# Patient Record
Sex: Female | Born: 1935
Health system: Southern US, Community
[De-identification: ages and names within clinical notes are randomized; demographics above are authoritative.]

## PROBLEM LIST (undated history)

## (undated) DIAGNOSIS — K219 Gastro-esophageal reflux disease without esophagitis: Secondary | ICD-10-CM

## (undated) DIAGNOSIS — E785 Hyperlipidemia, unspecified: Secondary | ICD-10-CM

## (undated) DIAGNOSIS — Z8619 Personal history of other infectious and parasitic diseases: Secondary | ICD-10-CM

## (undated) DIAGNOSIS — E119 Type 2 diabetes mellitus without complications: Secondary | ICD-10-CM

## (undated) DIAGNOSIS — G4730 Sleep apnea, unspecified: Secondary | ICD-10-CM

## (undated) DIAGNOSIS — T7840XA Allergy, unspecified, initial encounter: Secondary | ICD-10-CM

## (undated) DIAGNOSIS — H269 Unspecified cataract: Secondary | ICD-10-CM

## (undated) DIAGNOSIS — I10 Essential (primary) hypertension: Secondary | ICD-10-CM

## (undated) HISTORY — DX: Personal history of other infectious and parasitic diseases: Z86.19

## (undated) HISTORY — DX: Unspecified cataract: H26.9

## (undated) HISTORY — DX: Hyperlipidemia, unspecified: E78.5

## (undated) HISTORY — DX: Allergy, unspecified, initial encounter: T78.40XA

## (undated) HISTORY — PX: BUNIONECTOMY: SHX129

## (undated) HISTORY — DX: Gastro-esophageal reflux disease without esophagitis: K21.9

## (undated) HISTORY — PX: APPENDECTOMY: SHX54

## (undated) HISTORY — DX: Type 2 diabetes mellitus without complications: E11.9

## (undated) HISTORY — PX: BREAST EXCISIONAL BIOPSY: SUR124

## (undated) HISTORY — DX: Essential (primary) hypertension: I10

## (undated) HISTORY — DX: Sleep apnea, unspecified: G47.30

## (undated) HISTORY — PX: BREAST SURGERY: SHX581

## (undated) HISTORY — PX: BREAST BIOPSY: SHX20

---

## 2016-03-18 DIAGNOSIS — I10 Essential (primary) hypertension: Secondary | ICD-10-CM | POA: Diagnosis not present

## 2016-03-18 DIAGNOSIS — I340 Nonrheumatic mitral (valve) insufficiency: Secondary | ICD-10-CM | POA: Diagnosis not present

## 2016-04-04 DIAGNOSIS — I340 Nonrheumatic mitral (valve) insufficiency: Secondary | ICD-10-CM | POA: Diagnosis not present

## 2016-04-04 DIAGNOSIS — J039 Acute tonsillitis, unspecified: Secondary | ICD-10-CM | POA: Diagnosis not present

## 2016-04-04 DIAGNOSIS — J019 Acute sinusitis, unspecified: Secondary | ICD-10-CM | POA: Diagnosis not present

## 2016-04-04 DIAGNOSIS — E119 Type 2 diabetes mellitus without complications: Secondary | ICD-10-CM | POA: Diagnosis not present

## 2016-05-13 DIAGNOSIS — L603 Nail dystrophy: Secondary | ICD-10-CM | POA: Diagnosis not present

## 2016-05-13 DIAGNOSIS — E119 Type 2 diabetes mellitus without complications: Secondary | ICD-10-CM | POA: Diagnosis not present

## 2016-05-13 DIAGNOSIS — L84 Corns and callosities: Secondary | ICD-10-CM | POA: Diagnosis not present

## 2016-05-13 DIAGNOSIS — B351 Tinea unguium: Secondary | ICD-10-CM | POA: Diagnosis not present

## 2016-05-16 DIAGNOSIS — M25374 Other instability, right foot: Secondary | ICD-10-CM | POA: Diagnosis not present

## 2016-05-16 DIAGNOSIS — M7752 Other enthesopathy of left foot: Secondary | ICD-10-CM | POA: Diagnosis not present

## 2016-05-16 DIAGNOSIS — M25375 Other instability, left foot: Secondary | ICD-10-CM | POA: Diagnosis not present

## 2016-05-16 DIAGNOSIS — M7751 Other enthesopathy of right foot: Secondary | ICD-10-CM | POA: Diagnosis not present

## 2016-05-20 DIAGNOSIS — M109 Gout, unspecified: Secondary | ICD-10-CM | POA: Diagnosis not present

## 2016-05-20 DIAGNOSIS — E559 Vitamin D deficiency, unspecified: Secondary | ICD-10-CM | POA: Diagnosis not present

## 2016-05-20 DIAGNOSIS — E782 Mixed hyperlipidemia: Secondary | ICD-10-CM | POA: Diagnosis not present

## 2016-05-20 DIAGNOSIS — E119 Type 2 diabetes mellitus without complications: Secondary | ICD-10-CM | POA: Diagnosis not present

## 2016-05-20 DIAGNOSIS — Z0000 Encounter for general adult medical examination without abnormal findings: Secondary | ICD-10-CM | POA: Diagnosis not present

## 2016-05-20 DIAGNOSIS — I10 Essential (primary) hypertension: Secondary | ICD-10-CM | POA: Diagnosis not present

## 2016-06-03 DIAGNOSIS — M216X1 Other acquired deformities of right foot: Secondary | ICD-10-CM | POA: Diagnosis not present

## 2016-06-03 DIAGNOSIS — M216X2 Other acquired deformities of left foot: Secondary | ICD-10-CM | POA: Diagnosis not present

## 2016-06-03 DIAGNOSIS — M2042 Other hammer toe(s) (acquired), left foot: Secondary | ICD-10-CM | POA: Diagnosis not present

## 2016-06-03 DIAGNOSIS — M2041 Other hammer toe(s) (acquired), right foot: Secondary | ICD-10-CM | POA: Diagnosis not present

## 2016-06-03 DIAGNOSIS — M2022 Hallux rigidus, left foot: Secondary | ICD-10-CM | POA: Diagnosis not present

## 2016-06-06 DIAGNOSIS — E1165 Type 2 diabetes mellitus with hyperglycemia: Secondary | ICD-10-CM | POA: Diagnosis not present

## 2016-07-15 DIAGNOSIS — L84 Corns and callosities: Secondary | ICD-10-CM | POA: Diagnosis not present

## 2016-07-15 DIAGNOSIS — B351 Tinea unguium: Secondary | ICD-10-CM | POA: Diagnosis not present

## 2016-07-15 DIAGNOSIS — L603 Nail dystrophy: Secondary | ICD-10-CM | POA: Diagnosis not present

## 2016-07-15 DIAGNOSIS — E119 Type 2 diabetes mellitus without complications: Secondary | ICD-10-CM | POA: Diagnosis not present

## 2016-08-12 DIAGNOSIS — I10 Essential (primary) hypertension: Secondary | ICD-10-CM | POA: Diagnosis not present

## 2016-08-12 DIAGNOSIS — J029 Acute pharyngitis, unspecified: Secondary | ICD-10-CM | POA: Diagnosis not present

## 2016-08-12 DIAGNOSIS — E782 Mixed hyperlipidemia: Secondary | ICD-10-CM | POA: Diagnosis not present

## 2016-08-12 DIAGNOSIS — I739 Peripheral vascular disease, unspecified: Secondary | ICD-10-CM | POA: Diagnosis not present

## 2016-08-12 DIAGNOSIS — E119 Type 2 diabetes mellitus without complications: Secondary | ICD-10-CM | POA: Diagnosis not present

## 2016-08-12 DIAGNOSIS — M109 Gout, unspecified: Secondary | ICD-10-CM | POA: Diagnosis not present

## 2016-08-15 DIAGNOSIS — E119 Type 2 diabetes mellitus without complications: Secondary | ICD-10-CM | POA: Diagnosis not present

## 2016-08-15 DIAGNOSIS — M79672 Pain in left foot: Secondary | ICD-10-CM | POA: Diagnosis not present

## 2016-08-24 DIAGNOSIS — S90425A Blister (nonthermal), left lesser toe(s), initial encounter: Secondary | ICD-10-CM | POA: Diagnosis not present

## 2016-08-24 DIAGNOSIS — L089 Local infection of the skin and subcutaneous tissue, unspecified: Secondary | ICD-10-CM | POA: Diagnosis not present

## 2016-08-24 DIAGNOSIS — S90422A Blister (nonthermal), left great toe, initial encounter: Secondary | ICD-10-CM | POA: Diagnosis not present

## 2016-08-24 DIAGNOSIS — M79672 Pain in left foot: Secondary | ICD-10-CM | POA: Diagnosis not present

## 2016-08-30 DIAGNOSIS — S90422D Blister (nonthermal), left great toe, subsequent encounter: Secondary | ICD-10-CM | POA: Diagnosis not present

## 2016-08-30 DIAGNOSIS — E1151 Type 2 diabetes mellitus with diabetic peripheral angiopathy without gangrene: Secondary | ICD-10-CM | POA: Diagnosis not present

## 2016-09-01 DIAGNOSIS — E1165 Type 2 diabetes mellitus with hyperglycemia: Secondary | ICD-10-CM | POA: Diagnosis not present

## 2016-09-05 DIAGNOSIS — E1165 Type 2 diabetes mellitus with hyperglycemia: Secondary | ICD-10-CM | POA: Diagnosis not present

## 2016-09-06 DIAGNOSIS — E1151 Type 2 diabetes mellitus with diabetic peripheral angiopathy without gangrene: Secondary | ICD-10-CM | POA: Diagnosis not present

## 2016-09-06 DIAGNOSIS — G609 Hereditary and idiopathic neuropathy, unspecified: Secondary | ICD-10-CM | POA: Diagnosis not present

## 2016-09-16 DIAGNOSIS — L84 Corns and callosities: Secondary | ICD-10-CM | POA: Diagnosis not present

## 2016-09-16 DIAGNOSIS — E119 Type 2 diabetes mellitus without complications: Secondary | ICD-10-CM | POA: Diagnosis not present

## 2016-09-16 DIAGNOSIS — B351 Tinea unguium: Secondary | ICD-10-CM | POA: Diagnosis not present

## 2016-09-16 DIAGNOSIS — L603 Nail dystrophy: Secondary | ICD-10-CM | POA: Diagnosis not present

## 2016-09-30 DIAGNOSIS — E1151 Type 2 diabetes mellitus with diabetic peripheral angiopathy without gangrene: Secondary | ICD-10-CM | POA: Diagnosis not present

## 2016-09-30 DIAGNOSIS — B353 Tinea pedis: Secondary | ICD-10-CM | POA: Diagnosis not present

## 2016-10-28 DIAGNOSIS — I10 Essential (primary) hypertension: Secondary | ICD-10-CM | POA: Diagnosis not present

## 2016-10-28 DIAGNOSIS — E119 Type 2 diabetes mellitus without complications: Secondary | ICD-10-CM | POA: Diagnosis not present

## 2016-10-28 DIAGNOSIS — E782 Mixed hyperlipidemia: Secondary | ICD-10-CM | POA: Diagnosis not present

## 2016-10-28 DIAGNOSIS — R05 Cough: Secondary | ICD-10-CM | POA: Diagnosis not present

## 2016-10-28 DIAGNOSIS — J029 Acute pharyngitis, unspecified: Secondary | ICD-10-CM | POA: Diagnosis not present

## 2016-10-28 DIAGNOSIS — M109 Gout, unspecified: Secondary | ICD-10-CM | POA: Diagnosis not present

## 2016-11-21 DIAGNOSIS — L603 Nail dystrophy: Secondary | ICD-10-CM | POA: Diagnosis not present

## 2016-11-21 DIAGNOSIS — L84 Corns and callosities: Secondary | ICD-10-CM | POA: Diagnosis not present

## 2016-11-21 DIAGNOSIS — E119 Type 2 diabetes mellitus without complications: Secondary | ICD-10-CM | POA: Diagnosis not present

## 2016-11-21 DIAGNOSIS — B351 Tinea unguium: Secondary | ICD-10-CM | POA: Diagnosis not present

## 2016-12-02 DIAGNOSIS — E1165 Type 2 diabetes mellitus with hyperglycemia: Secondary | ICD-10-CM | POA: Diagnosis not present

## 2016-12-22 DIAGNOSIS — H04553 Acquired stenosis of bilateral nasolacrimal duct: Secondary | ICD-10-CM | POA: Diagnosis not present

## 2016-12-22 DIAGNOSIS — H1013 Acute atopic conjunctivitis, bilateral: Secondary | ICD-10-CM | POA: Diagnosis not present

## 2016-12-22 DIAGNOSIS — H2513 Age-related nuclear cataract, bilateral: Secondary | ICD-10-CM | POA: Diagnosis not present

## 2016-12-22 DIAGNOSIS — E119 Type 2 diabetes mellitus without complications: Secondary | ICD-10-CM | POA: Diagnosis not present

## 2017-01-03 DIAGNOSIS — E782 Mixed hyperlipidemia: Secondary | ICD-10-CM | POA: Diagnosis not present

## 2017-01-03 DIAGNOSIS — E559 Vitamin D deficiency, unspecified: Secondary | ICD-10-CM | POA: Diagnosis not present

## 2017-01-03 DIAGNOSIS — E119 Type 2 diabetes mellitus without complications: Secondary | ICD-10-CM | POA: Diagnosis not present

## 2017-01-03 DIAGNOSIS — I10 Essential (primary) hypertension: Secondary | ICD-10-CM | POA: Diagnosis not present

## 2017-01-03 DIAGNOSIS — E038 Other specified hypothyroidism: Secondary | ICD-10-CM | POA: Diagnosis not present

## 2017-01-13 DIAGNOSIS — M109 Gout, unspecified: Secondary | ICD-10-CM | POA: Diagnosis not present

## 2017-01-13 DIAGNOSIS — E782 Mixed hyperlipidemia: Secondary | ICD-10-CM | POA: Diagnosis not present

## 2017-01-13 DIAGNOSIS — J45909 Unspecified asthma, uncomplicated: Secondary | ICD-10-CM | POA: Diagnosis not present

## 2017-01-13 DIAGNOSIS — I10 Essential (primary) hypertension: Secondary | ICD-10-CM | POA: Diagnosis not present

## 2017-01-13 DIAGNOSIS — E119 Type 2 diabetes mellitus without complications: Secondary | ICD-10-CM | POA: Diagnosis not present

## 2017-01-13 DIAGNOSIS — R05 Cough: Secondary | ICD-10-CM | POA: Diagnosis not present

## 2017-01-27 DIAGNOSIS — L603 Nail dystrophy: Secondary | ICD-10-CM | POA: Diagnosis not present

## 2017-01-27 DIAGNOSIS — B351 Tinea unguium: Secondary | ICD-10-CM | POA: Diagnosis not present

## 2017-01-27 DIAGNOSIS — E119 Type 2 diabetes mellitus without complications: Secondary | ICD-10-CM | POA: Diagnosis not present

## 2017-01-27 DIAGNOSIS — L84 Corns and callosities: Secondary | ICD-10-CM | POA: Diagnosis not present

## 2017-02-06 DIAGNOSIS — M170 Bilateral primary osteoarthritis of knee: Secondary | ICD-10-CM | POA: Diagnosis not present

## 2017-02-06 DIAGNOSIS — Z808 Family history of malignant neoplasm of other organs or systems: Secondary | ICD-10-CM | POA: Diagnosis not present

## 2017-02-06 DIAGNOSIS — E785 Hyperlipidemia, unspecified: Secondary | ICD-10-CM | POA: Diagnosis not present

## 2017-02-06 DIAGNOSIS — Z7984 Long term (current) use of oral hypoglycemic drugs: Secondary | ICD-10-CM | POA: Diagnosis not present

## 2017-02-06 DIAGNOSIS — Z8041 Family history of malignant neoplasm of ovary: Secondary | ICD-10-CM | POA: Diagnosis not present

## 2017-02-06 DIAGNOSIS — I10 Essential (primary) hypertension: Secondary | ICD-10-CM | POA: Diagnosis not present

## 2017-02-06 DIAGNOSIS — Z6830 Body mass index (BMI) 30.0-30.9, adult: Secondary | ICD-10-CM | POA: Diagnosis not present

## 2017-02-06 DIAGNOSIS — E119 Type 2 diabetes mellitus without complications: Secondary | ICD-10-CM | POA: Diagnosis not present

## 2017-02-06 DIAGNOSIS — J302 Other seasonal allergic rhinitis: Secondary | ICD-10-CM | POA: Diagnosis not present

## 2017-02-06 DIAGNOSIS — E6609 Other obesity due to excess calories: Secondary | ICD-10-CM | POA: Diagnosis not present

## 2017-02-17 DIAGNOSIS — E1151 Type 2 diabetes mellitus with diabetic peripheral angiopathy without gangrene: Secondary | ICD-10-CM | POA: Diagnosis not present

## 2017-02-17 DIAGNOSIS — M2022 Hallux rigidus, left foot: Secondary | ICD-10-CM | POA: Diagnosis not present

## 2017-02-17 DIAGNOSIS — M2021 Hallux rigidus, right foot: Secondary | ICD-10-CM | POA: Diagnosis not present

## 2017-03-03 DIAGNOSIS — E1165 Type 2 diabetes mellitus with hyperglycemia: Secondary | ICD-10-CM | POA: Diagnosis not present

## 2017-03-21 DIAGNOSIS — I10 Essential (primary) hypertension: Secondary | ICD-10-CM | POA: Diagnosis not present

## 2017-03-21 DIAGNOSIS — M609 Myositis, unspecified: Secondary | ICD-10-CM | POA: Diagnosis not present

## 2017-03-21 DIAGNOSIS — I340 Nonrheumatic mitral (valve) insufficiency: Secondary | ICD-10-CM | POA: Diagnosis not present

## 2017-03-21 DIAGNOSIS — E782 Mixed hyperlipidemia: Secondary | ICD-10-CM | POA: Diagnosis not present

## 2017-03-21 DIAGNOSIS — I6523 Occlusion and stenosis of bilateral carotid arteries: Secondary | ICD-10-CM | POA: Diagnosis not present

## 2017-03-24 DIAGNOSIS — E119 Type 2 diabetes mellitus without complications: Secondary | ICD-10-CM | POA: Diagnosis not present

## 2017-03-24 DIAGNOSIS — Z124 Encounter for screening for malignant neoplasm of cervix: Secondary | ICD-10-CM | POA: Diagnosis not present

## 2017-03-24 DIAGNOSIS — Z01419 Encounter for gynecological examination (general) (routine) without abnormal findings: Secondary | ICD-10-CM | POA: Diagnosis not present

## 2017-03-24 DIAGNOSIS — I10 Essential (primary) hypertension: Secondary | ICD-10-CM | POA: Diagnosis not present

## 2017-03-24 DIAGNOSIS — Z13820 Encounter for screening for osteoporosis: Secondary | ICD-10-CM | POA: Diagnosis not present

## 2017-03-24 DIAGNOSIS — Z1231 Encounter for screening mammogram for malignant neoplasm of breast: Secondary | ICD-10-CM | POA: Diagnosis not present

## 2017-03-31 DIAGNOSIS — E119 Type 2 diabetes mellitus without complications: Secondary | ICD-10-CM | POA: Diagnosis not present

## 2017-03-31 DIAGNOSIS — L84 Corns and callosities: Secondary | ICD-10-CM | POA: Diagnosis not present

## 2017-03-31 DIAGNOSIS — L603 Nail dystrophy: Secondary | ICD-10-CM | POA: Diagnosis not present

## 2017-03-31 DIAGNOSIS — B351 Tinea unguium: Secondary | ICD-10-CM | POA: Diagnosis not present

## 2017-04-05 DIAGNOSIS — M5412 Radiculopathy, cervical region: Secondary | ICD-10-CM | POA: Diagnosis not present

## 2017-04-05 DIAGNOSIS — M62411 Contracture of muscle, right shoulder: Secondary | ICD-10-CM | POA: Diagnosis not present

## 2017-04-05 DIAGNOSIS — M62412 Contracture of muscle, left shoulder: Secondary | ICD-10-CM | POA: Diagnosis not present

## 2017-04-05 DIAGNOSIS — M6281 Muscle weakness (generalized): Secondary | ICD-10-CM | POA: Diagnosis not present

## 2017-04-06 DIAGNOSIS — R29890 Loss of height: Secondary | ICD-10-CM | POA: Diagnosis not present

## 2017-04-06 DIAGNOSIS — E119 Type 2 diabetes mellitus without complications: Secondary | ICD-10-CM | POA: Diagnosis not present

## 2017-04-06 DIAGNOSIS — Z13820 Encounter for screening for osteoporosis: Secondary | ICD-10-CM | POA: Diagnosis not present

## 2017-04-06 DIAGNOSIS — Z1231 Encounter for screening mammogram for malignant neoplasm of breast: Secondary | ICD-10-CM | POA: Diagnosis not present

## 2017-04-06 DIAGNOSIS — Z780 Asymptomatic menopausal state: Secondary | ICD-10-CM | POA: Diagnosis not present

## 2017-04-14 DIAGNOSIS — M6281 Muscle weakness (generalized): Secondary | ICD-10-CM | POA: Diagnosis not present

## 2017-04-14 DIAGNOSIS — M5412 Radiculopathy, cervical region: Secondary | ICD-10-CM | POA: Diagnosis not present

## 2017-04-14 DIAGNOSIS — M62412 Contracture of muscle, left shoulder: Secondary | ICD-10-CM | POA: Diagnosis not present

## 2017-04-14 DIAGNOSIS — M62411 Contracture of muscle, right shoulder: Secondary | ICD-10-CM | POA: Diagnosis not present

## 2017-04-19 DIAGNOSIS — M62412 Contracture of muscle, left shoulder: Secondary | ICD-10-CM | POA: Diagnosis not present

## 2017-04-19 DIAGNOSIS — M62411 Contracture of muscle, right shoulder: Secondary | ICD-10-CM | POA: Diagnosis not present

## 2017-04-19 DIAGNOSIS — M5412 Radiculopathy, cervical region: Secondary | ICD-10-CM | POA: Diagnosis not present

## 2017-04-19 DIAGNOSIS — M6281 Muscle weakness (generalized): Secondary | ICD-10-CM | POA: Diagnosis not present

## 2017-04-26 DIAGNOSIS — M5412 Radiculopathy, cervical region: Secondary | ICD-10-CM | POA: Diagnosis not present

## 2017-04-26 DIAGNOSIS — M62412 Contracture of muscle, left shoulder: Secondary | ICD-10-CM | POA: Diagnosis not present

## 2017-04-26 DIAGNOSIS — M62411 Contracture of muscle, right shoulder: Secondary | ICD-10-CM | POA: Diagnosis not present

## 2017-04-26 DIAGNOSIS — M6281 Muscle weakness (generalized): Secondary | ICD-10-CM | POA: Diagnosis not present

## 2017-04-28 DIAGNOSIS — M5412 Radiculopathy, cervical region: Secondary | ICD-10-CM | POA: Diagnosis not present

## 2017-04-28 DIAGNOSIS — M6281 Muscle weakness (generalized): Secondary | ICD-10-CM | POA: Diagnosis not present

## 2017-04-28 DIAGNOSIS — M62411 Contracture of muscle, right shoulder: Secondary | ICD-10-CM | POA: Diagnosis not present

## 2017-04-28 DIAGNOSIS — M62412 Contracture of muscle, left shoulder: Secondary | ICD-10-CM | POA: Diagnosis not present

## 2017-05-03 DIAGNOSIS — M6281 Muscle weakness (generalized): Secondary | ICD-10-CM | POA: Diagnosis not present

## 2017-05-03 DIAGNOSIS — M5412 Radiculopathy, cervical region: Secondary | ICD-10-CM | POA: Diagnosis not present

## 2017-05-03 DIAGNOSIS — M62411 Contracture of muscle, right shoulder: Secondary | ICD-10-CM | POA: Diagnosis not present

## 2017-05-03 DIAGNOSIS — M62412 Contracture of muscle, left shoulder: Secondary | ICD-10-CM | POA: Diagnosis not present

## 2017-05-10 DIAGNOSIS — M62411 Contracture of muscle, right shoulder: Secondary | ICD-10-CM | POA: Diagnosis not present

## 2017-05-10 DIAGNOSIS — M5412 Radiculopathy, cervical region: Secondary | ICD-10-CM | POA: Diagnosis not present

## 2017-05-10 DIAGNOSIS — M62412 Contracture of muscle, left shoulder: Secondary | ICD-10-CM | POA: Diagnosis not present

## 2017-05-10 DIAGNOSIS — M6281 Muscle weakness (generalized): Secondary | ICD-10-CM | POA: Diagnosis not present

## 2017-05-12 DIAGNOSIS — M62411 Contracture of muscle, right shoulder: Secondary | ICD-10-CM | POA: Diagnosis not present

## 2017-05-12 DIAGNOSIS — M5412 Radiculopathy, cervical region: Secondary | ICD-10-CM | POA: Diagnosis not present

## 2017-05-12 DIAGNOSIS — M6281 Muscle weakness (generalized): Secondary | ICD-10-CM | POA: Diagnosis not present

## 2017-05-12 DIAGNOSIS — M62412 Contracture of muscle, left shoulder: Secondary | ICD-10-CM | POA: Diagnosis not present

## 2017-05-17 DIAGNOSIS — M62411 Contracture of muscle, right shoulder: Secondary | ICD-10-CM | POA: Diagnosis not present

## 2017-05-17 DIAGNOSIS — M5412 Radiculopathy, cervical region: Secondary | ICD-10-CM | POA: Diagnosis not present

## 2017-05-17 DIAGNOSIS — M6281 Muscle weakness (generalized): Secondary | ICD-10-CM | POA: Diagnosis not present

## 2017-05-17 DIAGNOSIS — M62412 Contracture of muscle, left shoulder: Secondary | ICD-10-CM | POA: Diagnosis not present

## 2017-05-19 DIAGNOSIS — M5412 Radiculopathy, cervical region: Secondary | ICD-10-CM | POA: Diagnosis not present

## 2017-05-19 DIAGNOSIS — M6281 Muscle weakness (generalized): Secondary | ICD-10-CM | POA: Diagnosis not present

## 2017-05-19 DIAGNOSIS — M62411 Contracture of muscle, right shoulder: Secondary | ICD-10-CM | POA: Diagnosis not present

## 2017-05-19 DIAGNOSIS — M62412 Contracture of muscle, left shoulder: Secondary | ICD-10-CM | POA: Diagnosis not present

## 2017-05-24 DIAGNOSIS — M6281 Muscle weakness (generalized): Secondary | ICD-10-CM | POA: Diagnosis not present

## 2017-05-24 DIAGNOSIS — M62411 Contracture of muscle, right shoulder: Secondary | ICD-10-CM | POA: Diagnosis not present

## 2017-05-24 DIAGNOSIS — M5412 Radiculopathy, cervical region: Secondary | ICD-10-CM | POA: Diagnosis not present

## 2017-05-24 DIAGNOSIS — M62412 Contracture of muscle, left shoulder: Secondary | ICD-10-CM | POA: Diagnosis not present

## 2017-06-09 DIAGNOSIS — B351 Tinea unguium: Secondary | ICD-10-CM | POA: Diagnosis not present

## 2017-06-09 DIAGNOSIS — E119 Type 2 diabetes mellitus without complications: Secondary | ICD-10-CM | POA: Diagnosis not present

## 2017-06-09 DIAGNOSIS — L603 Nail dystrophy: Secondary | ICD-10-CM | POA: Diagnosis not present

## 2017-06-09 DIAGNOSIS — L84 Corns and callosities: Secondary | ICD-10-CM | POA: Diagnosis not present

## 2017-06-15 DIAGNOSIS — I340 Nonrheumatic mitral (valve) insufficiency: Secondary | ICD-10-CM | POA: Diagnosis not present

## 2017-06-15 DIAGNOSIS — I10 Essential (primary) hypertension: Secondary | ICD-10-CM | POA: Diagnosis not present

## 2017-06-15 DIAGNOSIS — I25110 Atherosclerotic heart disease of native coronary artery with unstable angina pectoris: Secondary | ICD-10-CM | POA: Diagnosis not present

## 2017-06-15 DIAGNOSIS — E782 Mixed hyperlipidemia: Secondary | ICD-10-CM | POA: Diagnosis not present

## 2017-06-16 DIAGNOSIS — M6281 Muscle weakness (generalized): Secondary | ICD-10-CM | POA: Diagnosis not present

## 2017-06-16 DIAGNOSIS — M5412 Radiculopathy, cervical region: Secondary | ICD-10-CM | POA: Diagnosis not present

## 2017-06-16 DIAGNOSIS — M62412 Contracture of muscle, left shoulder: Secondary | ICD-10-CM | POA: Diagnosis not present

## 2017-06-16 DIAGNOSIS — M62411 Contracture of muscle, right shoulder: Secondary | ICD-10-CM | POA: Diagnosis not present

## 2017-06-22 DIAGNOSIS — E1165 Type 2 diabetes mellitus with hyperglycemia: Secondary | ICD-10-CM | POA: Diagnosis not present

## 2017-06-23 DIAGNOSIS — M6281 Muscle weakness (generalized): Secondary | ICD-10-CM | POA: Diagnosis not present

## 2017-06-23 DIAGNOSIS — M62411 Contracture of muscle, right shoulder: Secondary | ICD-10-CM | POA: Diagnosis not present

## 2017-06-23 DIAGNOSIS — M62412 Contracture of muscle, left shoulder: Secondary | ICD-10-CM | POA: Diagnosis not present

## 2017-06-23 DIAGNOSIS — M5412 Radiculopathy, cervical region: Secondary | ICD-10-CM | POA: Diagnosis not present

## 2017-06-30 DIAGNOSIS — M6281 Muscle weakness (generalized): Secondary | ICD-10-CM | POA: Diagnosis not present

## 2017-06-30 DIAGNOSIS — M62411 Contracture of muscle, right shoulder: Secondary | ICD-10-CM | POA: Diagnosis not present

## 2017-06-30 DIAGNOSIS — M5412 Radiculopathy, cervical region: Secondary | ICD-10-CM | POA: Diagnosis not present

## 2017-06-30 DIAGNOSIS — M62412 Contracture of muscle, left shoulder: Secondary | ICD-10-CM | POA: Diagnosis not present

## 2017-07-07 DIAGNOSIS — M6281 Muscle weakness (generalized): Secondary | ICD-10-CM | POA: Diagnosis not present

## 2017-07-07 DIAGNOSIS — M62412 Contracture of muscle, left shoulder: Secondary | ICD-10-CM | POA: Diagnosis not present

## 2017-07-07 DIAGNOSIS — M5412 Radiculopathy, cervical region: Secondary | ICD-10-CM | POA: Diagnosis not present

## 2017-07-07 DIAGNOSIS — M62411 Contracture of muscle, right shoulder: Secondary | ICD-10-CM | POA: Diagnosis not present

## 2017-07-14 DIAGNOSIS — M6281 Muscle weakness (generalized): Secondary | ICD-10-CM | POA: Diagnosis not present

## 2017-07-14 DIAGNOSIS — M62411 Contracture of muscle, right shoulder: Secondary | ICD-10-CM | POA: Diagnosis not present

## 2017-07-14 DIAGNOSIS — M62412 Contracture of muscle, left shoulder: Secondary | ICD-10-CM | POA: Diagnosis not present

## 2017-07-14 DIAGNOSIS — M5412 Radiculopathy, cervical region: Secondary | ICD-10-CM | POA: Diagnosis not present

## 2017-07-28 DIAGNOSIS — M62412 Contracture of muscle, left shoulder: Secondary | ICD-10-CM | POA: Diagnosis not present

## 2017-07-28 DIAGNOSIS — M6281 Muscle weakness (generalized): Secondary | ICD-10-CM | POA: Diagnosis not present

## 2017-07-28 DIAGNOSIS — M62411 Contracture of muscle, right shoulder: Secondary | ICD-10-CM | POA: Diagnosis not present

## 2017-07-28 DIAGNOSIS — M5412 Radiculopathy, cervical region: Secondary | ICD-10-CM | POA: Diagnosis not present

## 2017-08-25 DIAGNOSIS — E119 Type 2 diabetes mellitus without complications: Secondary | ICD-10-CM | POA: Diagnosis not present

## 2017-08-25 DIAGNOSIS — L603 Nail dystrophy: Secondary | ICD-10-CM | POA: Diagnosis not present

## 2017-08-25 DIAGNOSIS — B351 Tinea unguium: Secondary | ICD-10-CM | POA: Diagnosis not present

## 2017-08-25 DIAGNOSIS — L84 Corns and callosities: Secondary | ICD-10-CM | POA: Diagnosis not present

## 2017-09-08 DIAGNOSIS — I10 Essential (primary) hypertension: Secondary | ICD-10-CM | POA: Diagnosis not present

## 2017-09-08 DIAGNOSIS — Z1211 Encounter for screening for malignant neoplasm of colon: Secondary | ICD-10-CM | POA: Diagnosis not present

## 2017-09-08 DIAGNOSIS — I340 Nonrheumatic mitral (valve) insufficiency: Secondary | ICD-10-CM | POA: Diagnosis not present

## 2017-09-08 DIAGNOSIS — Z01419 Encounter for gynecological examination (general) (routine) without abnormal findings: Secondary | ICD-10-CM | POA: Diagnosis not present

## 2017-09-08 DIAGNOSIS — Z23 Encounter for immunization: Secondary | ICD-10-CM | POA: Diagnosis not present

## 2017-09-08 DIAGNOSIS — I25110 Atherosclerotic heart disease of native coronary artery with unstable angina pectoris: Secondary | ICD-10-CM | POA: Diagnosis not present

## 2017-09-08 DIAGNOSIS — Z135 Encounter for screening for eye and ear disorders: Secondary | ICD-10-CM | POA: Diagnosis not present

## 2017-09-08 DIAGNOSIS — I739 Peripheral vascular disease, unspecified: Secondary | ICD-10-CM | POA: Diagnosis not present

## 2017-09-08 DIAGNOSIS — E782 Mixed hyperlipidemia: Secondary | ICD-10-CM | POA: Diagnosis not present

## 2017-09-08 DIAGNOSIS — Z0000 Encounter for general adult medical examination without abnormal findings: Secondary | ICD-10-CM | POA: Diagnosis not present

## 2017-09-21 DIAGNOSIS — E1165 Type 2 diabetes mellitus with hyperglycemia: Secondary | ICD-10-CM | POA: Diagnosis not present

## 2017-10-27 DIAGNOSIS — B353 Tinea pedis: Secondary | ICD-10-CM | POA: Diagnosis not present

## 2017-10-27 DIAGNOSIS — B351 Tinea unguium: Secondary | ICD-10-CM | POA: Diagnosis not present

## 2017-10-27 DIAGNOSIS — L603 Nail dystrophy: Secondary | ICD-10-CM | POA: Diagnosis not present

## 2017-10-27 DIAGNOSIS — L84 Corns and callosities: Secondary | ICD-10-CM | POA: Diagnosis not present

## 2017-10-27 DIAGNOSIS — E119 Type 2 diabetes mellitus without complications: Secondary | ICD-10-CM | POA: Diagnosis not present

## 2017-11-10 DIAGNOSIS — E119 Type 2 diabetes mellitus without complications: Secondary | ICD-10-CM | POA: Diagnosis not present

## 2017-11-10 DIAGNOSIS — M2021 Hallux rigidus, right foot: Secondary | ICD-10-CM | POA: Diagnosis not present

## 2017-11-10 DIAGNOSIS — M7752 Other enthesopathy of left foot: Secondary | ICD-10-CM | POA: Diagnosis not present

## 2017-11-10 DIAGNOSIS — M2022 Hallux rigidus, left foot: Secondary | ICD-10-CM | POA: Diagnosis not present

## 2017-11-24 DIAGNOSIS — I10 Essential (primary) hypertension: Secondary | ICD-10-CM | POA: Diagnosis not present

## 2017-11-24 DIAGNOSIS — K922 Gastrointestinal hemorrhage, unspecified: Secondary | ICD-10-CM | POA: Diagnosis not present

## 2017-11-24 DIAGNOSIS — E559 Vitamin D deficiency, unspecified: Secondary | ICD-10-CM | POA: Diagnosis not present

## 2017-11-24 DIAGNOSIS — E782 Mixed hyperlipidemia: Secondary | ICD-10-CM | POA: Diagnosis not present

## 2017-11-24 DIAGNOSIS — H9390 Unspecified disorder of ear, unspecified ear: Secondary | ICD-10-CM | POA: Diagnosis not present

## 2017-11-24 DIAGNOSIS — E119 Type 2 diabetes mellitus without complications: Secondary | ICD-10-CM | POA: Diagnosis not present

## 2017-11-28 DIAGNOSIS — E119 Type 2 diabetes mellitus without complications: Secondary | ICD-10-CM | POA: Diagnosis not present

## 2017-11-28 DIAGNOSIS — I10 Essential (primary) hypertension: Secondary | ICD-10-CM | POA: Diagnosis not present

## 2017-11-28 DIAGNOSIS — E559 Vitamin D deficiency, unspecified: Secondary | ICD-10-CM | POA: Diagnosis not present

## 2017-11-28 DIAGNOSIS — E782 Mixed hyperlipidemia: Secondary | ICD-10-CM | POA: Diagnosis not present

## 2017-11-28 DIAGNOSIS — E038 Other specified hypothyroidism: Secondary | ICD-10-CM | POA: Diagnosis not present

## 2017-12-05 DIAGNOSIS — E038 Other specified hypothyroidism: Secondary | ICD-10-CM | POA: Diagnosis not present

## 2017-12-05 DIAGNOSIS — I10 Essential (primary) hypertension: Secondary | ICD-10-CM | POA: Diagnosis not present

## 2017-12-05 DIAGNOSIS — E119 Type 2 diabetes mellitus without complications: Secondary | ICD-10-CM | POA: Diagnosis not present

## 2017-12-05 DIAGNOSIS — E782 Mixed hyperlipidemia: Secondary | ICD-10-CM | POA: Diagnosis not present

## 2017-12-05 DIAGNOSIS — E559 Vitamin D deficiency, unspecified: Secondary | ICD-10-CM | POA: Diagnosis not present

## 2017-12-19 DIAGNOSIS — H2513 Age-related nuclear cataract, bilateral: Secondary | ICD-10-CM | POA: Diagnosis not present

## 2017-12-19 DIAGNOSIS — E119 Type 2 diabetes mellitus without complications: Secondary | ICD-10-CM | POA: Diagnosis not present

## 2017-12-19 DIAGNOSIS — H04553 Acquired stenosis of bilateral nasolacrimal duct: Secondary | ICD-10-CM | POA: Diagnosis not present

## 2017-12-19 DIAGNOSIS — H1013 Acute atopic conjunctivitis, bilateral: Secondary | ICD-10-CM | POA: Diagnosis not present

## 2017-12-21 DIAGNOSIS — E1165 Type 2 diabetes mellitus with hyperglycemia: Secondary | ICD-10-CM | POA: Diagnosis not present

## 2017-12-29 DIAGNOSIS — E119 Type 2 diabetes mellitus without complications: Secondary | ICD-10-CM | POA: Diagnosis not present

## 2017-12-29 DIAGNOSIS — B351 Tinea unguium: Secondary | ICD-10-CM | POA: Diagnosis not present

## 2017-12-29 DIAGNOSIS — L603 Nail dystrophy: Secondary | ICD-10-CM | POA: Diagnosis not present

## 2017-12-29 DIAGNOSIS — L84 Corns and callosities: Secondary | ICD-10-CM | POA: Diagnosis not present

## 2018-01-18 DIAGNOSIS — I119 Hypertensive heart disease without heart failure: Secondary | ICD-10-CM | POA: Diagnosis not present

## 2018-01-18 DIAGNOSIS — E663 Overweight: Secondary | ICD-10-CM | POA: Diagnosis not present

## 2018-01-18 DIAGNOSIS — Z87891 Personal history of nicotine dependence: Secondary | ICD-10-CM | POA: Diagnosis not present

## 2018-01-18 DIAGNOSIS — Z833 Family history of diabetes mellitus: Secondary | ICD-10-CM | POA: Diagnosis not present

## 2018-01-18 DIAGNOSIS — Z7984 Long term (current) use of oral hypoglycemic drugs: Secondary | ICD-10-CM | POA: Diagnosis not present

## 2018-01-18 DIAGNOSIS — Z7982 Long term (current) use of aspirin: Secondary | ICD-10-CM | POA: Diagnosis not present

## 2018-01-18 DIAGNOSIS — Z6829 Body mass index (BMI) 29.0-29.9, adult: Secondary | ICD-10-CM | POA: Diagnosis not present

## 2018-01-18 DIAGNOSIS — E785 Hyperlipidemia, unspecified: Secondary | ICD-10-CM | POA: Diagnosis not present

## 2018-01-18 DIAGNOSIS — E1142 Type 2 diabetes mellitus with diabetic polyneuropathy: Secondary | ICD-10-CM | POA: Diagnosis not present

## 2018-01-22 DIAGNOSIS — I10 Essential (primary) hypertension: Secondary | ICD-10-CM | POA: Diagnosis not present

## 2018-01-22 DIAGNOSIS — E119 Type 2 diabetes mellitus without complications: Secondary | ICD-10-CM | POA: Diagnosis not present

## 2018-01-22 DIAGNOSIS — J449 Chronic obstructive pulmonary disease, unspecified: Secondary | ICD-10-CM | POA: Diagnosis not present

## 2018-01-22 DIAGNOSIS — M109 Gout, unspecified: Secondary | ICD-10-CM | POA: Diagnosis not present

## 2018-01-22 DIAGNOSIS — E559 Vitamin D deficiency, unspecified: Secondary | ICD-10-CM | POA: Diagnosis not present

## 2018-03-22 DIAGNOSIS — E1165 Type 2 diabetes mellitus with hyperglycemia: Secondary | ICD-10-CM | POA: Diagnosis not present

## 2018-05-29 ENCOUNTER — Encounter: Payer: Self-pay | Admitting: Family Medicine

## 2018-05-29 ENCOUNTER — Ambulatory Visit (HOSPITAL_BASED_OUTPATIENT_CLINIC_OR_DEPARTMENT_OTHER)
Admission: RE | Admit: 2018-05-29 | Discharge: 2018-05-29 | Disposition: A | Discharge status: Home / Self Care | Payer: Medicare HMO | Source: Ambulatory Visit | Attending: Family Medicine | Admitting: Family Medicine

## 2018-05-29 ENCOUNTER — Ambulatory Visit: Payer: Medicare HMO | Admitting: Family Medicine

## 2018-05-29 VITALS — BP 118/62 | HR 82 | Temp 98.2°F | Resp 18 | Ht 59.0 in | Wt 164.4 lb

## 2018-05-29 DIAGNOSIS — R05 Cough: Secondary | ICD-10-CM | POA: Diagnosis not present

## 2018-05-29 DIAGNOSIS — R32 Unspecified urinary incontinence: Secondary | ICD-10-CM

## 2018-05-29 DIAGNOSIS — M1711 Unilateral primary osteoarthritis, right knee: Secondary | ICD-10-CM | POA: Diagnosis not present

## 2018-05-29 DIAGNOSIS — M25561 Pain in right knee: Secondary | ICD-10-CM | POA: Diagnosis present

## 2018-05-29 DIAGNOSIS — T7840XA Allergy, unspecified, initial encounter: Secondary | ICD-10-CM | POA: Diagnosis not present

## 2018-05-29 DIAGNOSIS — Z8619 Personal history of other infectious and parasitic diseases: Secondary | ICD-10-CM | POA: Insufficient documentation

## 2018-05-29 DIAGNOSIS — I10 Essential (primary) hypertension: Secondary | ICD-10-CM | POA: Diagnosis not present

## 2018-05-29 DIAGNOSIS — R059 Cough, unspecified: Secondary | ICD-10-CM

## 2018-05-29 DIAGNOSIS — J029 Acute pharyngitis, unspecified: Secondary | ICD-10-CM | POA: Insufficient documentation

## 2018-05-29 DIAGNOSIS — M25562 Pain in left knee: Secondary | ICD-10-CM | POA: Insufficient documentation

## 2018-05-29 DIAGNOSIS — H269 Unspecified cataract: Secondary | ICD-10-CM | POA: Diagnosis not present

## 2018-05-29 DIAGNOSIS — E785 Hyperlipidemia, unspecified: Secondary | ICD-10-CM | POA: Insufficient documentation

## 2018-05-29 DIAGNOSIS — G8929 Other chronic pain: Secondary | ICD-10-CM

## 2018-05-29 DIAGNOSIS — E119 Type 2 diabetes mellitus without complications: Secondary | ICD-10-CM | POA: Diagnosis not present

## 2018-05-29 DIAGNOSIS — M1712 Unilateral primary osteoarthritis, left knee: Secondary | ICD-10-CM | POA: Diagnosis not present

## 2018-05-29 DIAGNOSIS — R1013 Epigastric pain: Secondary | ICD-10-CM | POA: Diagnosis not present

## 2018-05-29 LAB — COMPREHENSIVE METABOLIC PANEL
ALT: 10 U/L (ref 0–35)
AST: 15 U/L (ref 0–37)
Albumin: 4.1 g/dL (ref 3.5–5.2)
Alkaline Phosphatase: 84 U/L (ref 39–117)
BUN: 22 mg/dL (ref 6–23)
CO2: 26 mEq/L (ref 19–32)
Calcium: 9 mg/dL (ref 8.4–10.5)
Chloride: 103 mEq/L (ref 96–112)
Creatinine, Ser: 0.85 mg/dL (ref 0.40–1.20)
GFR: 77.19 mL/min (ref >60.00)
Glucose, Bld: 105 mg/dL — ABNORMAL HIGH (ref 70–99)
POTASSIUM: 4.3 meq/L (ref 3.5–5.1)
Sodium: 139 mEq/L (ref 135–145)
Total Bilirubin: 0.2 mg/dL (ref 0.2–1.2)
Total Protein: 6.6 g/dL (ref 6.0–8.3)

## 2018-05-29 LAB — LIPID PANEL
CHOL/HDL RATIO: 2
Cholesterol: 155 mg/dL (ref 0–200)
HDL: 69.1 mg/dL (ref >39.00)
LDL Cholesterol: 67 mg/dL (ref 0–99)
NonHDL: 85.44
Triglycerides: 92 mg/dL (ref 0.0–149.0)
VLDL: 18.4 mg/dL (ref 0.0–40.0)

## 2018-05-29 LAB — URINALYSIS
Bilirubin Urine: NEGATIVE
Hgb urine dipstick: NEGATIVE
Ketones, ur: NEGATIVE
Leukocytes,Ua: NEGATIVE
NITRITE: NEGATIVE
Specific Gravity, Urine: 1.01 (ref 1.000–1.030)
Total Protein, Urine: NEGATIVE
Urine Glucose: NEGATIVE
Urobilinogen, UA: 0.2 (ref 0.0–1.0)
pH: 5.5 (ref 5.0–8.0)

## 2018-05-29 LAB — CBC WITH DIFFERENTIAL/PLATELET
Basophils Absolute: 0 10*3/uL (ref 0.0–0.1)
Basophils Relative: 0.4 % (ref 0.0–3.0)
Eosinophils Absolute: 0.1 10*3/uL (ref 0.0–0.7)
Eosinophils Relative: 1.7 % (ref 0.0–5.0)
HCT: 38.4 % (ref 36.0–46.0)
Hemoglobin: 12.4 g/dL (ref 12.0–15.0)
LYMPHS PCT: 32.6 % (ref 12.0–46.0)
Lymphs Abs: 2.1 10*3/uL (ref 0.7–4.0)
MCHC: 32.3 g/dL (ref 30.0–36.0)
MCV: 88.3 fl (ref 78.0–100.0)
MONOS PCT: 7.6 % (ref 3.0–12.0)
Monocytes Absolute: 0.5 10*3/uL (ref 0.1–1.0)
Neutro Abs: 3.8 10*3/uL (ref 1.4–7.7)
Neutrophils Relative %: 57.7 % (ref 43.0–77.0)
Platelets: 209 10*3/uL (ref 150.0–400.0)
RBC: 4.35 Mil/uL (ref 3.87–5.11)
RDW: 13.5 % (ref 11.5–15.5)
WBC: 6.6 10*3/uL (ref 4.0–10.5)

## 2018-05-29 LAB — H. PYLORI ANTIBODY, IGG: H Pylori IgG: NEGATIVE

## 2018-05-29 LAB — TSH: TSH: 1.51 u[IU]/mL (ref 0.35–4.50)

## 2018-05-29 LAB — HEMOGLOBIN A1C: Hgb A1c MFr Bld: 6.2 % (ref 4.6–6.5)

## 2018-05-29 MED ORDER — ROSUVASTATIN CALCIUM 5 MG PO TABS: 5.0000 mg | ORAL_TABLET | Freq: Every day | ORAL | 1 refills | Status: DC

## 2018-05-29 MED ORDER — METFORMIN HCL 1000 MG PO TABS: 1000.0000 mg | ORAL_TABLET | Freq: Every day | ORAL | 1 refills | Status: DC

## 2018-05-29 MED ORDER — FAMOTIDINE 20 MG PO TABS: 20.0000 mg | ORAL_TABLET | Freq: Two times a day (BID) | ORAL | 5 refills | Status: DC

## 2018-05-29 MED ORDER — METFORMIN HCL ER 500 MG PO TB24: 500.0000 mg | ORAL_TABLET | Freq: Every day | ORAL | 1 refills | Status: DC

## 2018-05-29 MED ORDER — ALBUTEROL SULFATE HFA 108 (90 BASE) MCG/ACT IN AERS: 2.0000 | INHALATION_SPRAY | Freq: Four times a day (QID) | RESPIRATORY_TRACT | 2 refills | Status: DC | PRN

## 2018-05-29 MED ORDER — CETIRIZINE HCL 10 MG PO TABS: 10.0000 mg | ORAL_TABLET | Freq: Every day | ORAL | 11 refills | Status: DC

## 2018-05-29 MED ORDER — VALSARTAN-HYDROCHLOROTHIAZIDE 80-12.5 MG PO TABS: 1.0000 | ORAL_TABLET | Freq: Every day | ORAL | 1 refills | Status: DC

## 2018-05-29 MED ORDER — BENZONATATE 100 MG PO CAPS: 100.0000 mg | ORAL_CAPSULE | Freq: Two times a day (BID) | ORAL | 3 refills | Status: DC | PRN
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
## 2018-05-29 NOTE — Patient Instructions (Signed)
Carbohydrate Counting for Diabetes Mellitus, Adult  Carbohydrate counting is a method of keeping track of how many carbohydrates you eat. Eating carbohydrates naturally increases the amount of sugar (glucose) in the blood. Counting how many carbohydrates you eat helps keep your blood glucose within normal limits, which helps you manage your diabetes (diabetes mellitus). It is important to know how many carbohydrates you can safely have in each meal. This is different for every person. A diet and nutrition specialist (registered dietitian) can help you make a meal plan and calculate how many carbohydrates you should have at each meal and snack. Carbohydrates are found in the following foods:  Grains, such as breads and cereals.  Dried beans and soy products.  Starchy vegetables, such as potatoes, peas, and corn.  Fruit and fruit juices.  Milk and yogurt.  Sweets and snack foods, such as cake, cookies, candy, chips, and soft drinks. How do I count carbohydrates? There are two ways to count carbohydrates in food. You can use either of the methods or a combination of both. Reading "Nutrition Facts" on packaged food The "Nutrition Facts" list is included on the labels of almost all packaged foods and beverages in the [place] It includes:  The serving size.  Information about nutrients in each serving, including the grams (g) of carbohydrate per serving. To use the "Nutrition Facts":  Decide how many servings you will have.  Multiply the number of servings by the number of carbohydrates per serving.  The resulting number is the total amount of carbohydrates that you will be having. Learning standard serving sizes of other foods When you eat carbohydrate foods that are not packaged or do not include "Nutrition Facts" on the label, you need to measure the servings in order to count the amount of carbohydrates:  Measure the foods that you will eat with a food scale or measuring cup, if needed.   Decide how many standard-size servings you will eat.  Multiply the number of servings by 15. Most carbohydrate-rich foods have about 15 g of carbohydrates per serving. ? For example, if you eat 8 oz (170 g) of strawberries, you will have eaten 2 servings and 30 g of carbohydrates (2 servings x 15 g = 30 g).  For foods that have more than one food mixed, such as soups and casseroles, you must count the carbohydrates in each food that is included. The following list contains standard serving sizes of common carbohydrate-rich foods. Each of these servings has about 15 g of carbohydrates:   hamburger bun or  English muffin.   oz (15 mL) syrup.   oz (14 g) jelly.  1 slice of bread.  1 six-inch tortilla.  3 oz (85 g) cooked rice or pasta.  4 oz (113 g) cooked dried beans.  4 oz (113 g) starchy vegetable, such as peas, corn, or potatoes.  4 oz (113 g) hot cereal.  4 oz (113 g) mashed potatoes or  of a large baked potato.  4 oz (113 g) canned or frozen fruit.  4 oz (120 mL) fruit juice.  4-6 crackers.  6 chicken nuggets.  6 oz (170 g) unsweetened dry cereal.  6 oz (170 g) plain fat-free yogurt or yogurt sweetened with artificial sweeteners.  8 oz (240 mL) milk.  8 oz (170 g) fresh fruit or one small piece of fruit.  24 oz (680 g) popped popcorn. Example of carbohydrate counting Sample meal  3 oz (85 g) chicken breast.  6 oz (170 g)   brown rice.  4 oz (113 g) corn.  8 oz (240 mL) milk.  8 oz (170 g) strawberries with sugar-free whipped topping. Carbohydrate calculation 1. Identify the foods that contain carbohydrates: ? Rice. ? Corn. ? Milk. ? Strawberries. 2. Calculate how many servings you have of each food: ? 2 servings rice. ? 1 serving corn. ? 1 serving milk. ? 1 serving strawberries. 3. Multiply each number of servings by 15 g: ? 2 servings rice x 15 g = 30 g. ? 1 serving corn x 15 g = 15 g. ? 1 serving milk x 15 g = 15 g. ? 1 serving  strawberries x 15 g = 15 g. 4. Add together all of the amounts to find the total grams of carbohydrates eaten: ? 30 g + 15 g + 15 g + 15 g = 75 g of carbohydrates total. Summary  Carbohydrate counting is a method of keeping track of how many carbohydrates you eat.  Eating carbohydrates naturally increases the amount of sugar (glucose) in the blood.  Counting how many carbohydrates you eat helps keep your blood glucose within normal limits, which helps you manage your diabetes.  A diet and nutrition specialist (registered dietitian) can help you make a meal plan and calculate how many carbohydrates you should have at each meal and snack. This information is not intended to replace advice given to you by your health care provider. Make sure you discuss any questions you have with your health care provider. Document Released: 04/04/2005 Document Revised: 10/12/2016 Document Reviewed: 09/16/2015 Elsevier Interactive Patient Education  2019 Elsevier Inc.  

## 2018-05-29 NOTE — Assessment & Plan Note (Addendum)
Long term for many years. No obvious pattern except maybe drinking less water makes it worse. No correlation with position changes or exertion. Flares by weather/temperature changes Does feel it is triggered by PND does produce clear phlegm with cough. Treat with Famotidine 20 mg po bid and Cetirizine 10 mg po bid and a cxr. If no improvement will need work up potentially with ENT, GI and pulmonary

## 2018-05-29 NOTE — Assessment & Plan Note (Signed)
Only when she ignores the urge. Encouraged to go more often. Do kegel exercises twice daily, check UA and culture.

## 2018-05-30 NOTE — Assessment & Plan Note (Signed)
Encouraged heart healthy diet, increase exercise, avoid trans fats, consider a krill oil cap daily 

## 2018-05-30 NOTE — Assessment & Plan Note (Signed)
minimize simple carbs. Increase exercise as tolerated. Continue current meds. Refills given records requested.

## 2018-05-30 NOTE — Assessment & Plan Note (Signed)
Well controlled, no changes to meds. Encouraged heart healthy diet such as the DASH diet and exercise as tolerated.  °
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
## 2018-06-01 ENCOUNTER — Telehealth: Payer: Self-pay | Admitting: *Deleted

## 2018-06-01 NOTE — Telephone Encounter (Signed)
Received Medical records from [facility], [place] [facility], stated they have no records to send; forwarded to provider/SLS 02/14

## 2018-06-08 ENCOUNTER — Ambulatory Visit (INDEPENDENT_AMBULATORY_CARE_PROVIDER_SITE_OTHER): Payer: Medicare HMO | Admitting: Orthopedic Surgery

## 2018-06-08 ENCOUNTER — Encounter (INDEPENDENT_AMBULATORY_CARE_PROVIDER_SITE_OTHER): Payer: Self-pay | Admitting: Orthopedic Surgery

## 2018-06-08 DIAGNOSIS — M1712 Unilateral primary osteoarthritis, left knee: Secondary | ICD-10-CM | POA: Diagnosis not present

## 2018-06-08 DIAGNOSIS — M1711 Unilateral primary osteoarthritis, right knee: Secondary | ICD-10-CM

## 2018-06-08 MED ORDER — METHYLPREDNISOLONE ACETATE 40 MG/ML IJ SUSP
40.0000 mg | INTRAMUSCULAR | Status: AC | PRN
  Administered 2018-06-08: 40 mg via INTRA_ARTICULAR

## 2018-06-08 MED ORDER — BUPIVACAINE HCL 0.25 % IJ SOLN
4.0000 mL | INTRAMUSCULAR | Status: AC | PRN
  Administered 2018-06-08: 4 mL via INTRA_ARTICULAR

## 2018-06-08 MED ORDER — LIDOCAINE HCL 1 % IJ SOLN
5.0000 mL | INTRAMUSCULAR | Status: AC | PRN
  Administered 2018-06-08: 5 mL

## 2018-06-08 NOTE — Progress Notes (Signed)
Office Visit Note   Patient: Abigail Mcpherson           Date of Birth: Mar 22, 1933           MRN: [id number] Visit Date: 06/08/2018 Requested by: [name], MD [address] [place], [place] [address] PCP: [name], MD  Subjective: Chief Complaint  Patient presents with  . Right Knee - Pain  . Left Knee - Pain    HPI: Abigail Mcpherson is a patient with bilateral knee pain right worse than left.  She is had pain for a long time.  She reports swelling weakness giving way some popping but the pain does not wake her from sleep at night.  She takes Tylenol as needed.  She tried physical therapy for 6 weeks a year ago and it helped while she was doing it but that relief has not continued.  Patient does have a history of diabetes but her blood glucose typically runs in the 120s to 130s.  Radiographs on the system are reviewed and she has bilateral knee arthritis worse in the medial compartment which is where she localizes most of her pain              ROS: All systems reviewed are negative as they relate to the chief complaint within the history of present illness.  Patient denies  fevers or chills.   Assessment & Plan: Visit Diagnoses:  1. Unilateral primary osteoarthritis, left knee   2. Unilateral primary osteoarthritis, right knee     Plan: Impression is bilateral knee pain right worse than left.  Arthritis is present.  Injection performed into the right knee today.  I will see her back as needed in a week or 2 if she wants to get that left knee injected and if her blood glucose does not react to adversely.  She wants to hold off on any type of surgical options.  Follow-Up Instructions: Return if symptoms worsen or fail to improve.   Orders:  No orders of the defined types were placed in this encounter.  No orders of the defined types were placed in this encounter.     Procedures: Large Joint Inj: R knee on 06/08/2018 12:10 PM Indications: diagnostic  evaluation, joint swelling and pain Details: 18 G 1.5 in needle, superolateral approach  Arthrogram: No  Medications: 5 mL lidocaine 1 %; 40 mg methylPREDNISolone acetate 40 MG/ML; 4 mL bupivacaine 0.25 % Outcome: tolerated well, no immediate complications Procedure, treatment alternatives, risks and benefits explained, specific risks discussed. Consent was given by the patient. Immediately prior to procedure a time out was called to verify the correct patient, procedure, equipment, support staff and site/side marked as required. Patient was prepped and draped in the usual sterile fashion.       Clinical Data: No additional findings.  Objective: Vital Signs: There were no vitals taken for this visit.  Physical Exam:   Constitutional: Patient appears well-developed HEENT:  Head: Normocephalic Eyes:EOM are normal Neck: Normal range of motion Cardiovascular: Normal rate Pulmonary/chest: Effort normal Neurologic: Patient is alert Skin: Skin is warm Psychiatric: Patient has normal mood and affect    Ortho Exam: Ortho exam demonstrates flexion contracture of about 10 to 15 degrees in both knees.  Both knees flex just past 90 degrees.  No effusion in either knee.  Trace edema in bilateral lower extremities but feet are perfused and sensate and mobile.  Has intact extensor mechanism bilaterally.  No other masses lymphadenopathy  or skin changes noted in the knee region.  Specialty Comments:  No specialty comments available.  Imaging: No results found.   PMFS History: Patient Active Problem List   Diagnosis Date Noted  . Cough 05/29/2018  . Urinary incontinence 05/29/2018  . Knee pain, bilateral 05/29/2018  . Allergies 05/29/2018  . Allergy   . History of shingles   . Diabetes mellitus without complication (HCC)   . Hyperlipidemia   . Hypertension   . Cataract    Past Medical History:  Diagnosis Date  . Allergy   . Cataract   . Diabetes mellitus without complication  (HCC)   . GERD (gastroesophageal reflux disease)   . History of shingles   . Hyperlipidemia   . Hypertension     Family History  Problem Relation Age of Onset  . Diabetes Mother   . Hypertension Mother   . Hernia Mother        inguinal  . Diabetes Father   . Stroke Son   . Aneurysm Paternal Aunt        brain  . Cancer Sister        intestinal  . Hypertension Sister   . Diabetes Sister   . Glaucoma Sister   . Diabetes Sister   . Hypertension Sister   . Hyperlipidemia Sister   . Diabetes Sister   . Hypertension Sister   . Hyperlipidemia Sister   . Hernia Sister        umbilical  . Hyperlipidemia Sister   . Hypertension Sister   . Diabetes Sister   . Alcohol abuse Sister   . Diabetes Sister   . Hyperlipidemia Sister   . Hypertension Sister   . Obesity Sister     Past Surgical History:  Procedure Laterality Date  . APPENDECTOMY    . BREAST SURGERY     breast biopsy left , b/l lumpectomy 1969  . BUNIONECTOMY Bilateral   . CESAREAN SECTION     Social History   Occupational History  . Occupation: Nurse    Comment: retired  Tobacco Use  . Smoking status: Former Games developer  . Smokeless tobacco: Never Used  Substance and Sexual Activity  . Alcohol use: Yes  . Drug use: Yes  . Sexual activity: Not Currently

## 2018-06-21 ENCOUNTER — Ambulatory Visit (INDEPENDENT_AMBULATORY_CARE_PROVIDER_SITE_OTHER): Payer: Medicare HMO | Admitting: Orthopedic Surgery

## 2018-06-21 ENCOUNTER — Telehealth (INDEPENDENT_AMBULATORY_CARE_PROVIDER_SITE_OTHER): Payer: Self-pay

## 2018-06-21 ENCOUNTER — Encounter (INDEPENDENT_AMBULATORY_CARE_PROVIDER_SITE_OTHER): Payer: Self-pay | Admitting: Orthopedic Surgery

## 2018-06-21 DIAGNOSIS — M1712 Unilateral primary osteoarthritis, left knee: Secondary | ICD-10-CM

## 2018-06-21 DIAGNOSIS — E1165 Type 2 diabetes mellitus with hyperglycemia: Secondary | ICD-10-CM | POA: Diagnosis not present

## 2018-06-21 MED ORDER — LIDOCAINE HCL 1 % IJ SOLN
5.0000 mL | INTRAMUSCULAR | Status: AC | PRN
  Administered 2018-06-21: 5 mL

## 2018-06-21 MED ORDER — METHYLPREDNISOLONE ACETATE 40 MG/ML IJ SUSP
40.0000 mg | INTRAMUSCULAR | Status: AC | PRN
  Administered 2018-06-21: 40 mg via INTRA_ARTICULAR

## 2018-06-21 MED ORDER — BUPIVACAINE HCL 0.25 % IJ SOLN
4.0000 mL | INTRAMUSCULAR | Status: AC | PRN
  Administered 2018-06-21: 4 mL via INTRA_ARTICULAR

## 2018-06-21 NOTE — Progress Notes (Signed)
   Procedure Note  Patient: Abigail Mcpherson             Date of Birth: 12-Sep-1932           MRN: [id number]             Visit Date: 06/21/2018  Procedures: Visit Diagnoses: Unilateral primary osteoarthritis, left knee  Large Joint Inj: L knee on 06/21/2018 8:27 PM Indications: diagnostic evaluation, joint swelling and pain Details: 18 G 1.5 in needle, superolateral approach  Arthrogram: No  Medications: 5 mL lidocaine 1 %; 40 mg methylPREDNISolone acetate 40 MG/ML; 4 mL bupivacaine 0.25 % Outcome: tolerated well, no immediate complications Procedure, treatment alternatives, risks and benefits explained, specific risks discussed. Consent was given by the patient. Immediately prior to procedure a time out was called to verify the correct patient, procedure, equipment, support staff and site/side marked as required. Patient was prepped and draped in the usual sterile fashion.     Patient presents for left knee injection.  Had right knee injection a week ago and blood glucose was okay.  Scheduled injection left knee performed today.  We will see her back as needed and we may consider gel injections next.

## 2018-06-21 NOTE — Telephone Encounter (Signed)
Noted  

## 2018-06-21 NOTE — Telephone Encounter (Signed)
Can we get patient approved for bilateral gel injections? Thanks.

## 2018-06-27 NOTE — Telephone Encounter (Signed)
[employer], Gel-One.

## 2018-06-28 NOTE — Telephone Encounter (Signed)
Submitted online for Gel-One VOB, also submitted to [facility] for PA.

## 2018-07-03 ENCOUNTER — Telehealth (INDEPENDENT_AMBULATORY_CARE_PROVIDER_SITE_OTHER): Payer: Self-pay

## 2018-07-03 NOTE — Telephone Encounter (Signed)
PA required for Gel-One, left knee. PA Approval# [id number] Valid 06/28/2018- 09/28/2018

## 2018-07-03 NOTE — Telephone Encounter (Signed)
Talked with patient and advised her that she is approved for gel injection.  Patient stated that she will call back to schedule appointment with Dr. August Saucer.  Approved for Gel-One, Left Knee Buy & Bill May have a Co-pay of $10.00 PA required PA Approved-[id number] Valid 06/28/2018- 09/28/2018

## 2018-07-06 ENCOUNTER — Telehealth (INDEPENDENT_AMBULATORY_CARE_PROVIDER_SITE_OTHER): Payer: Self-pay

## 2018-07-06 NOTE — Telephone Encounter (Signed)
Submitted VOB for Gel-One, bilateral knee.  

## 2018-07-09 ENCOUNTER — Telehealth (INDEPENDENT_AMBULATORY_CARE_PROVIDER_SITE_OTHER): Payer: Self-pay | Admitting: Orthopedic Surgery

## 2018-07-09 ENCOUNTER — Encounter (INDEPENDENT_AMBULATORY_CARE_PROVIDER_SITE_OTHER): Payer: Self-pay | Admitting: Orthopedic Surgery

## 2018-07-09 NOTE — Telephone Encounter (Signed)
Patient sent mychart as well. Responded via [employer]

## 2018-07-09 NOTE — Telephone Encounter (Signed)
Pt called saying the knee she had her inj in is very painful and it hurts to bend her knee.   Pt # [phone]

## 2018-07-09 NOTE — Telephone Encounter (Signed)
Please advise. Thanks.  

## 2018-07-09 NOTE — Telephone Encounter (Signed)
I would ice it down preapproved gel injections and used over-the-counter medication thanks

## 2018-07-10 NOTE — Telephone Encounter (Signed)
Gel cushions the knee.  Works about two thirds of the time.  Has the potential to take your pain down around 20 to 30%.  Please call thanks

## 2018-07-11 ENCOUNTER — Telehealth (INDEPENDENT_AMBULATORY_CARE_PROVIDER_SITE_OTHER): Payer: Self-pay | Admitting: Orthopedic Surgery

## 2018-07-11 ENCOUNTER — Telehealth (INDEPENDENT_AMBULATORY_CARE_PROVIDER_SITE_OTHER): Payer: Self-pay

## 2018-07-11 NOTE — Telephone Encounter (Signed)
Talked with [facility] representative concerning Dx code for PA for gel injection.

## 2018-07-11 NOTE — Telephone Encounter (Signed)
Please advise 

## 2018-07-11 NOTE — Telephone Encounter (Signed)
Do you know what this was in regards to?

## 2018-07-11 NOTE — Telephone Encounter (Signed)
Patient has been approved for Gel-One, Right Knee Buy & Bill Patient may have a Co-pay PA Required PA Approval# [id number] Valid 07/11/2018- 10/11/2018

## 2018-07-11 NOTE — Telephone Encounter (Signed)
Judeth Cornfield with [facility] called in regards to a PA that was placed by Toniann Fail.  She is needing a DX code in order to process the PA.  Her CB#[phone].  Thank you

## 2018-07-13 ENCOUNTER — Telehealth (INDEPENDENT_AMBULATORY_CARE_PROVIDER_SITE_OTHER): Payer: Self-pay

## 2018-07-13 NOTE — Telephone Encounter (Signed)
Received fax from [facility] stating that patient is approved for Gel-One, Bilateral Knee.

## 2018-07-19 ENCOUNTER — Encounter (INDEPENDENT_AMBULATORY_CARE_PROVIDER_SITE_OTHER): Payer: Self-pay | Admitting: Orthopedic Surgery

## 2018-08-18 ENCOUNTER — Encounter: Payer: Self-pay | Admitting: Family Medicine

## 2018-08-18 ENCOUNTER — Encounter: Payer: Self-pay | Admitting: Orthopedic Surgery

## 2018-08-20 ENCOUNTER — Telehealth: Payer: Self-pay

## 2018-08-20 NOTE — Telephone Encounter (Signed)
Talked with patient to schedule an appointment for gel injection for left knee, but patient stated that she will call back after she has her appointment with her PCP.   Approved for Gel-One, Left Knee Buy & Bill Patient may have a Co-pay PA required PA Approval# [id number] Valid 06/28/2018- 09/28/2018

## 2018-08-21 ENCOUNTER — Telehealth (INDEPENDENT_AMBULATORY_CARE_PROVIDER_SITE_OTHER): Payer: Medicare HMO | Admitting: Family Medicine

## 2018-08-21 ENCOUNTER — Other Ambulatory Visit: Payer: Self-pay

## 2018-08-21 VITALS — BP 134/79 | HR 64

## 2018-08-21 DIAGNOSIS — M25561 Pain in right knee: Secondary | ICD-10-CM | POA: Diagnosis not present

## 2018-08-21 DIAGNOSIS — M25562 Pain in left knee: Secondary | ICD-10-CM | POA: Diagnosis not present

## 2018-08-21 DIAGNOSIS — R059 Cough, unspecified: Secondary | ICD-10-CM

## 2018-08-21 DIAGNOSIS — T7840XA Allergy, unspecified, initial encounter: Secondary | ICD-10-CM | POA: Diagnosis not present

## 2018-08-21 DIAGNOSIS — R600 Localized edema: Secondary | ICD-10-CM

## 2018-08-21 DIAGNOSIS — I10 Essential (primary) hypertension: Secondary | ICD-10-CM

## 2018-08-21 DIAGNOSIS — R05 Cough: Secondary | ICD-10-CM | POA: Diagnosis not present

## 2018-08-21 MED ORDER — MONTELUKAST SODIUM 10 MG PO TABS: 10.0000 mg | ORAL_TABLET | Freq: Every evening | ORAL | 3 refills | Status: DC | PRN

## 2018-08-21 MED ORDER — FUROSEMIDE 20 MG PO TABS: 20.0000 mg | ORAL_TABLET | Freq: Every day | ORAL | 1 refills | Status: DC | PRN

## 2018-08-21 MED ORDER — AMLODIPINE BESYLATE 2.5 MG PO TABS: 2.5000 mg | ORAL_TABLET | Freq: Every day | ORAL | 2 refills | Status: DC

## 2018-08-21 MED ORDER — HYDROCHLOROTHIAZIDE 12.5 MG PO CAPS: 12.5000 mg | ORAL_CAPSULE | Freq: Every day | ORAL | 2 refills | Status: DC

## 2018-08-21 MED ORDER — TIZANIDINE HCL 2 MG PO TABS: 1.0000 mg | ORAL_TABLET | Freq: Every evening | ORAL | 1 refills | Status: AC | PRN

## 2018-08-21 NOTE — Assessment & Plan Note (Addendum)
Hurt her knees a week ago. Has had swelling and pain most notable over left knee with worst pain noted over the medial aspect of the knee. Can bare weight but is painful. Ice, and elevate. Tylenol bid and xray both knees

## 2018-08-21 NOTE — Assessment & Plan Note (Signed)
Elevate feet above heart 15 minutes tid, minimize sodium and lasix 20 mg daily prn

## 2018-08-21 NOTE — Assessment & Plan Note (Signed)
Add Singulair and refer to ENT for further evaluation

## 2018-08-21 NOTE — Assessment & Plan Note (Addendum)
Well controlled. Encouraged heart healthy diet such as the DASH diet and exercise as tolerated. Continue hctz 12.5 mg daily but stop Valsartan due to cough and start Amlodipine 2.5 mg daily. Monitor BP and follow up in 3 weeks or as needed.

## 2018-08-21 NOTE — Assessment & Plan Note (Signed)
Tried a course of H2 and H1 blockers bid with no improvement. Will refer to ENT for further evaluaiton. Stop Valsartan and add Singulair.

## 2018-08-21 NOTE — Progress Notes (Signed)
Virtual Visit via Video Note  I connected with Abigail Mcpherson on 08/21/18 at 10:40 AM EDT by a video enabled telemedicine application and verified that I am speaking with the correct person using two identifiers.  Location: Patient: home with her daughter Provider: home   I discussed the limitations of evaluation and management by telemedicine and the availability of in person appointments. The patient expressed understanding and agreed to proceed. Crissie Sickles, CMA was able to get the patient set up on a virtual video visit.      Subjective:    Patient ID: Abigail Mcpherson, female    DOB: 09-19-1932, 85 y.o.   MRN: [id number]  No chief complaint on file.   HPI Patient is in today for evaluation of bilateral knee pain and pedal edema for the past week since injuring herself while playing with her grandson. She is slowly improving but she has trouble baring weight due to pain. Has swelling in knees down to ankles. Pain is most notable at medial aspect of left knee. Her cough is still present and she has tried the H1 and H2 blockers. She has some congestion from allergies as well. Denies CP/palp/SOB/Mcpherson/fevers/GI or GU c/o. Taking meds as prescribed  Past Medical History:  Diagnosis Date  . Allergy   . Cataract   . Diabetes mellitus without complication (HCC)   . GERD (gastroesophageal reflux disease)   . History of shingles   . Hyperlipidemia   . Hypertension     Past Surgical History:  Procedure Laterality Date  . APPENDECTOMY    . BREAST SURGERY     breast biopsy left , b/l lumpectomy 1969  . BUNIONECTOMY Bilateral   . CESAREAN SECTION      Family History  Problem Relation Age of Onset  . Diabetes Mother   . Hypertension Mother   . Hernia Mother        inguinal  . Diabetes Father   . Stroke Son   . Aneurysm Paternal Aunt        brain  . Cancer Sister        intestinal  . Hypertension Sister   . Diabetes Sister   . Glaucoma Sister   . Diabetes Sister   .  Hypertension Sister   . Hyperlipidemia Sister   . Diabetes Sister   . Hypertension Sister   . Hyperlipidemia Sister   . Hernia Sister        umbilical  . Hyperlipidemia Sister   . Hypertension Sister   . Diabetes Sister   . Alcohol abuse Sister   . Diabetes Sister   . Hyperlipidemia Sister   . Hypertension Sister   . Obesity Sister     Social History   Socioeconomic History  . Marital status: Divorced    Spouse name: Not on file  . Number of children: Not on file  . Years of education: Not on file  . Highest education level: Not on file  Occupational History  . Occupation: Engineer, civil (consulting)    Comment: retired  Engineer, production  . Financial resource strain: Not on file  . Food insecurity:    Worry: Not on file    Inability: Not on file  . Transportation needs:    Medical: Not on file    Non-medical: Not on file  Tobacco Use  . Smoking status: Former Games developer  . Smokeless tobacco: Never Used  Substance and Sexual Activity  . Alcohol use: Yes  . Drug use: Yes  . Sexual activity: Not  Currently  Lifestyle  . Physical activity:    Days per week: Not on file    Minutes per session: Not on file  . Stress: Not on file  Relationships  . Social connections:    Talks on phone: Not on file    Gets together: Not on file    Attends religious service: Not on file    Active member of club or organization: Not on file    Attends meetings of clubs or organizations: Not on file    Relationship status: Not on file  . Intimate partner violence:    Fear of current or ex partner: Not on file    Emotionally abused: Not on file    Physically abused: Not on file    Forced sexual activity: Not on file  Other Topics Concern  . Not on file  Social History Narrative   Avoids pork and shellfish and deep sea fish. Lives with family, daughter, wears seat belt always. Divorced, retired Engineer, civil (consulting)nurse    Outpatient Medications Prior to Visit  Medication Sig Dispense Refill  . albuterol (PROVENTIL HFA;VENTOLIN  HFA) 108 (90 Base) MCG/ACT inhaler Inhale 2 puffs into the lungs every 6 (six) hours as needed for wheezing or shortness of breath. 1 Inhaler 2  . aspirin 81 MG tablet Take 81 mg by mouth daily. Daily OTC    . benzonatate (TESSALON) 100 MG capsule Take 1 capsule (100 mg total) by mouth 2 (two) times daily as needed for cough. 30 capsule 3  . cetirizine (ZYRTEC) 10 MG tablet Take 1 tablet (10 mg total) by mouth daily. 30 tablet 11  . famotidine (PEPCID) 20 MG tablet Take 1 tablet (20 mg total) by mouth 2 (two) times daily. 60 tablet 5  . metFORMIN (GLUCOPHAGE) 1000 MG tablet Take 1 tablet (1,000 mg total) by mouth daily. 90 tablet 1  . metFORMIN (GLUCOPHAGE-XR) 500 MG 24 hr tablet Take 1 tablet (500 mg total) by mouth daily. 90 tablet 1  . rosuvastatin (CRESTOR) 5 MG tablet Take 1 tablet (5 mg total) by mouth daily. 90 tablet 1  . valsartan-hydrochlorothiazide (DIOVAN-HCT) 80-12.5 MG tablet Take 1 tablet by mouth daily. 90 tablet 1   No facility-administered medications prior to visit.     Allergies  Allergen Reactions  . Iodine Hives  . Shellfish Allergy Rash    Vomiting  Deep sea fish   . Codeine     tinnitus    Review of Systems  Constitutional: Negative for fever and malaise/fatigue.  HENT: Positive for congestion.   Eyes: Negative for blurred vision.  Respiratory: Positive for cough. Negative for shortness of breath.   Cardiovascular: Positive for leg swelling. Negative for chest pain and palpitations.  Gastrointestinal: Negative for abdominal pain, blood in stool and nausea.  Genitourinary: Negative for dysuria and frequency.  Musculoskeletal: Positive for joint pain. Negative for falls.  Skin: Negative for rash.  Neurological: Negative for dizziness, loss of consciousness and headaches.  Endo/Heme/Allergies: Negative for environmental allergies.  Psychiatric/Behavioral: Negative for depression. The patient is not nervous/anxious.        Objective:    Physical Exam  Constitutional:      Appearance: Normal appearance. She is not ill-appearing.  HENT:     Head: Normocephalic and atraumatic.     Nose: Nose normal.  Pulmonary:     Effort: Pulmonary effort is normal.  Neurological:     Mental Status: She is alert and oriented to person, place, and time.  Psychiatric:  Mood and Affect: Mood normal.        Behavior: Behavior normal.     BP 134/79   Pulse 64  Wt Readings from Last 3 Encounters:  05/29/18 164 lb 6.4 oz (74.6 kg)    Diabetic Foot Exam - Simple   No data filed     Lab Results  Component Value Date   WBC 6.6 05/29/2018   HGB 12.4 05/29/2018   HCT 38.4 05/29/2018   PLT 209.0 05/29/2018   GLUCOSE 105 (H) 05/29/2018   CHOL 155 05/29/2018   TRIG 92.0 05/29/2018   HDL 69.10 05/29/2018   LDLCALC 67 05/29/2018   ALT 10 05/29/2018   AST 15 05/29/2018   NA 139 05/29/2018   K 4.3 05/29/2018   CL 103 05/29/2018   CREATININE 0.85 05/29/2018   BUN 22 05/29/2018   CO2 26 05/29/2018   TSH 1.51 05/29/2018   HGBA1C 6.2 05/29/2018    Lab Results  Component Value Date   TSH 1.51 05/29/2018   Lab Results  Component Value Date   WBC 6.6 05/29/2018   HGB 12.4 05/29/2018   HCT 38.4 05/29/2018   MCV 88.3 05/29/2018   PLT 209.0 05/29/2018   Lab Results  Component Value Date   NA 139 05/29/2018   K 4.3 05/29/2018   CO2 26 05/29/2018   GLUCOSE 105 (H) 05/29/2018   BUN 22 05/29/2018   CREATININE 0.85 05/29/2018   BILITOT 0.2 05/29/2018   ALKPHOS 84 05/29/2018   AST 15 05/29/2018   ALT 10 05/29/2018   PROT 6.6 05/29/2018   ALBUMIN 4.1 05/29/2018   CALCIUM 9.0 05/29/2018   GFR 77.19 05/29/2018   Lab Results  Component Value Date   CHOL 155 05/29/2018   Lab Results  Component Value Date   HDL 69.10 05/29/2018   Lab Results  Component Value Date   LDLCALC 67 05/29/2018   Lab Results  Component Value Date   TRIG 92.0 05/29/2018   Lab Results  Component Value Date   CHOLHDL 2 05/29/2018   Lab Results   Component Value Date   HGBA1C 6.2 05/29/2018       Assessment & Plan:   Problem List Items Addressed This Visit    Cough    Tried a course of H2 and H1 blockers bid with no improvement. Will refer to ENT for further evaluaiton. Stop Valsartan and add Singulair.       Relevant Orders   Ambulatory referral to ENT   Knee pain, bilateral - Primary    Hurt her knees a week ago. Has had swelling and pain most notable over left knee with worst pain noted over the medial aspect of the knee. Can bare weight but is painful. Ice, and elevate. Tylenol bid and xray both knees      Relevant Orders   DG Knee Complete 4 Views Left   DG Knee Complete 4 Views Right   Allergy    Add Singulair and refer to ENT for further evaluation      Relevant Orders   Ambulatory referral to ENT   Hypertension    Well controlled. Encouraged heart healthy diet such as the DASH diet and exercise as tolerated. Continue hctz 12.5 mg daily but stop Valsartan due to cough and start Amlodipine 2.5 mg daily. Monitor BP and follow up in 3 weeks or as needed.      Relevant Medications   furosemide (LASIX) 20 MG tablet   hydrochlorothiazide (MICROZIDE) 12.5 MG capsule  amLODipine (NORVASC) 2.5 MG tablet   Pedal edema    Elevate feet above heart 15 minutes tid, minimize sodium and lasix 20 mg daily prn         I have discontinued Elease Hashimoto Beals's valsartan-hydrochlorothiazide. I am also having her start on tiZANidine, furosemide, hydrochlorothiazide, amLODipine, and montelukast. Additionally, I am having her maintain her albuterol, rosuvastatin, benzonatate, metFORMIN, metFORMIN, aspirin, cetirizine, and famotidine.  Meds ordered this encounter  Medications  . tiZANidine (ZANAFLEX) 2 MG tablet    Sig: Take 0.5-1 tablets (1-2 mg total) by mouth at bedtime as needed for muscle spasms.    Dispense:  30 tablet    Refill:  1  . furosemide (LASIX) 20 MG tablet    Sig: Take 1 tablet (20 mg total) by mouth daily  as needed for edema.    Dispense:  30 tablet    Refill:  1  . hydrochlorothiazide (MICROZIDE) 12.5 MG capsule    Sig: Take 1 capsule (12.5 mg total) by mouth daily.    Dispense:  30 capsule    Refill:  2  . amLODipine (NORVASC) 2.5 MG tablet    Sig: Take 1 tablet (2.5 mg total) by mouth daily.    Dispense:  30 tablet    Refill:  2  . montelukast (SINGULAIR) 10 MG tablet    Sig: Take 1 tablet (10 mg total) by mouth at bedtime as needed (allergies).    Dispense:  30 tablet    Refill:  3   I discussed the assessment and treatment plan with the patient. The patient was provided an opportunity to ask questions and all were answered. The patient agreed with the plan and demonstrated an understanding of the instructions.   The patient was advised to call back or seek an in-person evaluation if the symptoms worsen or if the condition fails to improve as anticipated.  I provided 25 minutes of non-face-to-face time during this encounter.   Danise Edge, MD

## 2018-08-23 ENCOUNTER — Other Ambulatory Visit: Payer: Self-pay

## 2018-08-23 ENCOUNTER — Ambulatory Visit (HOSPITAL_BASED_OUTPATIENT_CLINIC_OR_DEPARTMENT_OTHER)
Admission: RE | Admit: 2018-08-23 | Discharge: 2018-08-23 | Disposition: A | Discharge status: Home / Self Care | Payer: Medicare HMO | Source: Ambulatory Visit | Attending: Family Medicine | Admitting: Family Medicine

## 2018-08-23 DIAGNOSIS — M25562 Pain in left knee: Secondary | ICD-10-CM | POA: Diagnosis present

## 2018-08-23 DIAGNOSIS — M25561 Pain in right knee: Secondary | ICD-10-CM | POA: Diagnosis present

## 2018-08-23 DIAGNOSIS — M1711 Unilateral primary osteoarthritis, right knee: Secondary | ICD-10-CM | POA: Diagnosis not present

## 2018-08-23 DIAGNOSIS — M1712 Unilateral primary osteoarthritis, left knee: Secondary | ICD-10-CM | POA: Diagnosis not present

## 2018-08-24 ENCOUNTER — Encounter: Payer: Self-pay | Admitting: Family Medicine

## 2018-08-26 ENCOUNTER — Other Ambulatory Visit: Payer: Self-pay | Admitting: Family Medicine

## 2018-08-26 DIAGNOSIS — G8929 Other chronic pain: Secondary | ICD-10-CM

## 2018-08-26 DIAGNOSIS — M25562 Pain in left knee: Secondary | ICD-10-CM

## 2018-08-30 ENCOUNTER — Telehealth: Payer: Self-pay

## 2018-08-30 NOTE — Telephone Encounter (Signed)
Copied from CRM [id number]. Topic: General - Inquiry >> Aug 29, 2018  4:22 PM Maia Petties wrote: Cheri Fowler w/[facility] Ph# [phone] Ref Case # [id number]  MR KNEE RIGHT WO CONTRAST - Needing additional info for peer to peer to be completed by May 19th.

## 2018-09-05 ENCOUNTER — Telehealth: Payer: Self-pay | Admitting: Family Medicine

## 2018-09-05 NOTE — Telephone Encounter (Signed)
Copied from CRM [id number]. Topic: Quick Communication - See Telephone Encounter >> Sep 05, 2018  4:34 PM Jens Som A wrote: CRM for notification. See Telephone encounter for: 09/05/18.  Patient is calling because [employer] did not approve request of MRI of both. What should the patient do to that the patient can have the MRI. Please advise.[phone] (M)

## 2018-09-05 NOTE — Telephone Encounter (Signed)
Please let them know that insurance is not approving the MR and likely will not without exam. Ortho is more likely to get this approved and can manage the options in terms of treatment better. So I am going to refer to orthopaedics for evaluation unless they tell us not to because she is feeling better.

## 2018-09-07 ENCOUNTER — Telehealth: Payer: Self-pay | Admitting: Orthopedic Surgery

## 2018-09-07 NOTE — Telephone Encounter (Signed)
I called and spoke with patient and her daughter. Dr. Abner Greenspan ordered Right Knee MRI and Left Knee MRI after video visit with patient.  Per patient's daughter, Dr. Abner Greenspan spoke with the insurance company who denied MRI and they told her she did not have the correct credentials to order. Patient would like for Dr. August Saucer to order MRI of Bilat Knees and see if insurance will approve.  Please advise. Would you like for me to enter orders?   I did explain that Dr. August Saucer is not in the office this afternoon and we will not get back with her until next week. I also advised there is no way we would have authorization for MRI tomorrow.

## 2018-09-07 NOTE — Telephone Encounter (Signed)
Pt called in said her primary care dr.blyth was wanting to put in a request for an mri but it got denied due to her being primary care, and her primary care told her to contact dr.dean to see if he would put in the request to have the mri done?

## 2018-09-07 NOTE — Telephone Encounter (Signed)
Mri is scheduled for this Saturday 09-08-2018

## 2018-09-08 ENCOUNTER — Other Ambulatory Visit: Payer: Medicare HMO

## 2018-09-11 NOTE — Telephone Encounter (Signed)
Patient made aware and has contacted her orthopedic doc

## 2018-09-11 NOTE — Telephone Encounter (Signed)
I looked at her x-rays and she has essentially end-stage arthritis in both knees.  I do not really think an MRI scan is going to help [place] much with our options.  If she wants to have an intervention I think she needs to decide if she is up for a knee replacement or if she wants to undergo arthroscopy which would be a very very questionable and marginal intervention with  likely no benefit.  Can you call Raelee and find out what she is up for in terms of intervention.  If she is not really up for any type of intervention then an MRI scan is going to show arthritis meniscal tears and end-stage arthritis.

## 2018-09-11 NOTE — Telephone Encounter (Signed)
Coming on Friday for gel one injection

## 2018-09-13 ENCOUNTER — Other Ambulatory Visit: Payer: Self-pay

## 2018-09-13 ENCOUNTER — Ambulatory Visit (INDEPENDENT_AMBULATORY_CARE_PROVIDER_SITE_OTHER): Payer: Medicare HMO | Admitting: Family Medicine

## 2018-09-13 VITALS — BP 126/63 | HR 66 | Wt 161.0 lb

## 2018-09-13 DIAGNOSIS — M25561 Pain in right knee: Secondary | ICD-10-CM | POA: Diagnosis not present

## 2018-09-13 DIAGNOSIS — T7840XA Allergy, unspecified, initial encounter: Secondary | ICD-10-CM

## 2018-09-13 DIAGNOSIS — G8929 Other chronic pain: Secondary | ICD-10-CM

## 2018-09-13 DIAGNOSIS — I10 Essential (primary) hypertension: Secondary | ICD-10-CM | POA: Diagnosis not present

## 2018-09-13 DIAGNOSIS — M25562 Pain in left knee: Secondary | ICD-10-CM

## 2018-09-13 DIAGNOSIS — R600 Localized edema: Secondary | ICD-10-CM | POA: Diagnosis not present

## 2018-09-13 DIAGNOSIS — E119 Type 2 diabetes mellitus without complications: Secondary | ICD-10-CM | POA: Diagnosis not present

## 2018-09-13 DIAGNOSIS — E785 Hyperlipidemia, unspecified: Secondary | ICD-10-CM | POA: Diagnosis not present

## 2018-09-13 DIAGNOSIS — R05 Cough: Secondary | ICD-10-CM | POA: Diagnosis not present

## 2018-09-13 DIAGNOSIS — R059 Cough, unspecified: Secondary | ICD-10-CM

## 2018-09-13 MED ORDER — MONTELUKAST SODIUM 10 MG PO TABS: 10.0000 mg | ORAL_TABLET | Freq: Every evening | ORAL | 3 refills | Status: DC | PRN

## 2018-09-13 MED ORDER — POTASSIUM CHLORIDE CRYS ER 20 MEQ PO TBCR: 20.0000 meq | EXTENDED_RELEASE_TABLET | Freq: Every day | ORAL | 3 refills | Status: DC | PRN

## 2018-09-13 MED ORDER — ALBUTEROL SULFATE HFA 108 (90 BASE) MCG/ACT IN AERS: 2.0000 | INHALATION_SPRAY | Freq: Four times a day (QID) | RESPIRATORY_TRACT | 2 refills | Status: DC | PRN

## 2018-09-13 MED ORDER — FUROSEMIDE 20 MG PO TABS: ORAL_TABLET | ORAL | 1 refills | Status: DC

## 2018-09-13 MED ORDER — AMLODIPINE BESYLATE 2.5 MG PO TABS: 2.5000 mg | ORAL_TABLET | Freq: Every day | ORAL | 1 refills | Status: DC

## 2018-09-13 MED ORDER — BENZONATATE 100 MG PO CAPS: 100.0000 mg | ORAL_CAPSULE | Freq: Two times a day (BID) | ORAL | 3 refills | Status: DC | PRN

## 2018-09-13 MED ORDER — FAMOTIDINE 20 MG PO TABS: 20.0000 mg | ORAL_TABLET | Freq: Two times a day (BID) | ORAL | 1 refills | Status: DC

## 2018-09-13 MED ORDER — POTASSIUM CHLORIDE CRYS ER 20 MEQ PO TBCR: 20.0000 meq | EXTENDED_RELEASE_TABLET | Freq: Every day | ORAL | 1 refills | Status: DC | PRN

## 2018-09-13 MED ORDER — HYDROCHLOROTHIAZIDE 12.5 MG PO CAPS: 12.5000 mg | ORAL_CAPSULE | Freq: Every day | ORAL | 1 refills | Status: DC

## 2018-09-13 MED ORDER — ROSUVASTATIN CALCIUM 5 MG PO TABS: 5.0000 mg | ORAL_TABLET | Freq: Every day | ORAL | 1 refills | Status: DC

## 2018-09-13 MED ORDER — METFORMIN HCL ER 500 MG PO TB24: ORAL_TABLET | ORAL | 1 refills | Status: DC

## 2018-09-14 ENCOUNTER — Ambulatory Visit: Payer: Medicare HMO | Admitting: Orthopedic Surgery

## 2018-09-16 NOTE — Assessment & Plan Note (Signed)
End stage are thritis and is now established with orthopaedics. They are going to try Synvisc treatments to start and then discuss replacement if no improvement. Encouraged moist heat and/or ice and gentle stretching as tolerated. May try Tylenol, Lidocaine topical and prescription meds as directed and report if symptoms worsen or seek immediate care

## 2018-09-16 NOTE — Assessment & Plan Note (Signed)
Stay as active as possible, lasix daily and prn, weight self daily, minimize sodium elevate feet, compression hose and should improve with time report worsening symptoms

## 2018-09-16 NOTE — Assessment & Plan Note (Signed)
no changes to meds. Encouraged heart healthy diet such as the DASH diet and exercise as tolerated. Encouraged to get set of weekly vitals, record and let us know any concerns. Will review at visits.

## 2018-09-16 NOTE — Assessment & Plan Note (Signed)
hgba1c acceptable, minimize simple carbs. Increase exercise as tolerated.  

## 2018-09-16 NOTE — Progress Notes (Signed)
Virtual Visit via Video Note  I connected with Abigail Mcpherson on 09/13/18 at  3:00 PM EDT by a video enabled telemedicine application and verified that I am speaking with the correct person using two identifiers.  Location: Patient: home with daughter Provider: office   I discussed the limitations of evaluation and management by telemedicine and the availability of in person appointments. The patient expressed understanding and agreed to proceed.    Subjective:    Patient ID: Abigail Mcpherson, female    DOB: 09/26/1932, 85 y.o.   MRN: [id number]  No chief complaint on file.   HPI Patient is in today for follow up on pedal edema, bilateral knee pain, hypertension, cough, reflux and more. She feels mostly well. Her cough is improving some and she feels her allergies are responding to current meds. No recent febrile illness or hospitalizations. She is now established with orthopaedics and they are going to start with shots but she has end stage arthritis so have discussed possiblity of replacements which she is apprehensive about pursuing. Denies CP/palp/SOB/HA/congestion/fevers/GI or GU c/o. Taking meds as prescribed  Past Medical History:  Diagnosis Date  . Allergy   . Cataract   . Diabetes mellitus without complication (HCC)   . GERD (gastroesophageal reflux disease)   . History of shingles   . Hyperlipidemia   . Hypertension     Past Surgical History:  Procedure Laterality Date  . APPENDECTOMY    . BREAST SURGERY     breast biopsy left , b/l lumpectomy 1969  . BUNIONECTOMY Bilateral   . CESAREAN SECTION      Family History  Problem Relation Age of Onset  . Diabetes Mother   . Hypertension Mother   . Hernia Mother        inguinal  . Diabetes Father   . Stroke Son   . Aneurysm Paternal Aunt        brain  . Cancer Sister        intestinal  . Hypertension Sister   . Diabetes Sister   . Glaucoma Sister   . Diabetes Sister   . Hypertension Sister   .  Hyperlipidemia Sister   . Diabetes Sister   . Hypertension Sister   . Hyperlipidemia Sister   . Hernia Sister        umbilical  . Hyperlipidemia Sister   . Hypertension Sister   . Diabetes Sister   . Alcohol abuse Sister   . Diabetes Sister   . Hyperlipidemia Sister   . Hypertension Sister   . Obesity Sister     Social History   Socioeconomic History  . Marital status: Divorced    Spouse name: Not on file  . Number of children: Not on file  . Years of education: Not on file  . Highest education level: Not on file  Occupational History  . Occupation: Engineer, civil (consulting)    Comment: retired  Engineer, production  . Financial resource strain: Not on file  . Food insecurity:    Worry: Not on file    Inability: Not on file  . Transportation needs:    Medical: Not on file    Non-medical: Not on file  Tobacco Use  . Smoking status: Former Games developer  . Smokeless tobacco: Never Used  Substance and Sexual Activity  . Alcohol use: Yes  . Drug use: Yes  . Sexual activity: Not Currently  Lifestyle  . Physical activity:    Days per week: Not on file    Minutes  per session: Not on file  . Stress: Not on file  Relationships  . Social connections:    Talks on phone: Not on file    Gets together: Not on file    Attends religious service: Not on file    Active member of club or organization: Not on file    Attends meetings of clubs or organizations: Not on file    Relationship status: Not on file  . Intimate partner violence:    Fear of current or ex partner: Not on file    Emotionally abused: Not on file    Physically abused: Not on file    Forced sexual activity: Not on file  Other Topics Concern  . Not on file  Social History Narrative   Avoids pork and shellfish and deep sea fish. Lives with family, daughter, wears seat belt always. Divorced, retired Engineer, civil (consulting)    Outpatient Medications Prior to Visit  Medication Sig Dispense Refill  . aspirin 81 MG tablet Take 81 mg by mouth daily. Daily OTC     . cetirizine (ZYRTEC) 10 MG tablet Take 1 tablet (10 mg total) by mouth daily. 30 tablet 11  . tiZANidine (ZANAFLEX) 2 MG tablet Take 0.5-1 tablets (1-2 mg total) by mouth at bedtime as needed for muscle spasms. 30 tablet 1  . albuterol (PROVENTIL HFA;VENTOLIN HFA) 108 (90 Base) MCG/ACT inhaler Inhale 2 puffs into the lungs every 6 (six) hours as needed for wheezing or shortness of breath. 1 Inhaler 2  . amLODipine (NORVASC) 2.5 MG tablet Take 1 tablet (2.5 mg total) by mouth daily. 30 tablet 2  . benzonatate (TESSALON) 100 MG capsule Take 1 capsule (100 mg total) by mouth 2 (two) times daily as needed for cough. 30 capsule 3  . famotidine (PEPCID) 20 MG tablet Take 1 tablet (20 mg total) by mouth 2 (two) times daily. 60 tablet 5  . furosemide (LASIX) 20 MG tablet Take 1 tablet (20 mg total) by mouth daily as needed for edema. 30 tablet 1  . hydrochlorothiazide (MICROZIDE) 12.5 MG capsule Take 1 capsule (12.5 mg total) by mouth daily. 30 capsule 2  . metFORMIN (GLUCOPHAGE) 1000 MG tablet Take 1 tablet (1,000 mg total) by mouth daily. 90 tablet 1  . metFORMIN (GLUCOPHAGE-XR) 500 MG 24 hr tablet Take 1 tablet (500 mg total) by mouth daily. 90 tablet 1  . montelukast (SINGULAIR) 10 MG tablet Take 1 tablet (10 mg total) by mouth at bedtime as needed (allergies). 30 tablet 3  . rosuvastatin (CRESTOR) 5 MG tablet Take 1 tablet (5 mg total) by mouth daily. 90 tablet 1   No facility-administered medications prior to visit.     Allergies  Allergen Reactions  . Iodine Hives  . Shellfish Allergy Rash    Vomiting  Deep sea fish   . Codeine     tinnitus    Review of Systems  Constitutional: Negative for fever and malaise/fatigue.  HENT: Negative for congestion.   Eyes: Negative for blurred vision.  Respiratory: Positive for cough. Negative for sputum production and shortness of breath.   Cardiovascular: Positive for leg swelling. Negative for chest pain and palpitations.  Gastrointestinal:  Negative for abdominal pain, blood in stool and nausea.  Genitourinary: Negative for dysuria and frequency.  Musculoskeletal: Positive for joint pain. Negative for falls.  Skin: Negative for rash.  Neurological: Negative for dizziness, loss of consciousness and headaches.  Endo/Heme/Allergies: Negative for environmental allergies.  Psychiatric/Behavioral: Negative for depression. The patient is not nervous/anxious.  Objective:    Physical Exam Constitutional:      Appearance: Normal appearance. She is not ill-appearing.  HENT:     Head: Normocephalic and atraumatic.  Eyes:     General:        Right eye: No discharge.        Left eye: No discharge.  Pulmonary:     Effort: Pulmonary effort is normal.  Neurological:     Mental Status: She is alert and oriented to person, place, and time.  Psychiatric:        Mood and Affect: Mood normal.        Behavior: Behavior normal.     BP 126/63 (BP Location: Left Arm, Patient Position: Sitting, Cuff Size: Normal)   Pulse 66   Wt 161 lb (73 kg)   BMI 32.52 kg/m  Wt Readings from Last 3 Encounters:  09/13/18 161 lb (73 kg)  05/29/18 164 lb 6.4 oz (74.6 kg)    Diabetic Foot Exam - Simple   No data filed     Lab Results  Component Value Date   WBC 6.6 05/29/2018   HGB 12.4 05/29/2018   HCT 38.4 05/29/2018   PLT 209.0 05/29/2018   GLUCOSE 105 (H) 05/29/2018   CHOL 155 05/29/2018   TRIG 92.0 05/29/2018   HDL 69.10 05/29/2018   LDLCALC 67 05/29/2018   ALT 10 05/29/2018   AST 15 05/29/2018   NA 139 05/29/2018   K 4.3 05/29/2018   CL 103 05/29/2018   CREATININE 0.85 05/29/2018   BUN 22 05/29/2018   CO2 26 05/29/2018   TSH 1.51 05/29/2018   HGBA1C 6.2 05/29/2018    Lab Results  Component Value Date   TSH 1.51 05/29/2018   Lab Results  Component Value Date   WBC 6.6 05/29/2018   HGB 12.4 05/29/2018   HCT 38.4 05/29/2018   MCV 88.3 05/29/2018   PLT 209.0 05/29/2018   Lab Results  Component Value Date    NA 139 05/29/2018   K 4.3 05/29/2018   CO2 26 05/29/2018   GLUCOSE 105 (H) 05/29/2018   BUN 22 05/29/2018   CREATININE 0.85 05/29/2018   BILITOT 0.2 05/29/2018   ALKPHOS 84 05/29/2018   AST 15 05/29/2018   ALT 10 05/29/2018   PROT 6.6 05/29/2018   ALBUMIN 4.1 05/29/2018   CALCIUM 9.0 05/29/2018   GFR 77.19 05/29/2018   Lab Results  Component Value Date   CHOL 155 05/29/2018   Lab Results  Component Value Date   HDL 69.10 05/29/2018   Lab Results  Component Value Date   LDLCALC 67 05/29/2018   Lab Results  Component Value Date   TRIG 92.0 05/29/2018   Lab Results  Component Value Date   CHOLHDL 2 05/29/2018   Lab Results  Component Value Date   HGBA1C 6.2 05/29/2018       Assessment & Plan:   Problem List Items Addressed This Visit    Cough    Improved with treatments. Refilled Famotidine today      Knee pain, bilateral    End stage are thritis and is now established with orthopaedics. They are going to try Synvisc treatments to start and then discuss replacement if no improvement. Encouraged moist heat and/or ice and gentle stretching as tolerated. May try Tylenol, Lidocaine topical and prescription meds as directed and report if symptoms worsen or seek immediate care      Allergy    Refill given on Singulair current meds working  well      Diabetes mellitus without complication (HCC)    hgba1c acceptable, minimize simple carbs. Increase exercise as tolerated.      Relevant Medications   metFORMIN (GLUCOPHAGE-XR) 500 MG 24 hr tablet   rosuvastatin (CRESTOR) 5 MG tablet   Hypertension     no changes to meds. Encouraged heart healthy diet such as the DASH diet and exercise as tolerated. Encouraged to get set of weekly vitals, record and let us know any concerns. Will review at visits.      Relevant Medications   hydrochlorothiazide (MICROZIDE) 12.5 MG capsule   furosemide (LASIX) 20 MG tablet   rosuvastatin (CRESTOR) 5 MG tablet   amLODipine  (NORVASC) 2.5 MG tablet      I have discontinued Elease Hashimoto Haertel's metFORMIN. I have also changed her metFORMIN, furosemide, and albuterol. Additionally, I am having her maintain her aspirin, cetirizine, tiZANidine, hydrochlorothiazide, montelukast, rosuvastatin, famotidine, amLODipine, benzonatate, and potassium chloride SA.  Meds ordered this encounter  Medications  . metFORMIN (GLUCOPHAGE-XR) 500 MG 24 hr tablet    Sig: 2 tabs po in am and 1 tab po in pm    Dispense:  270 tablet    Refill:  1  . hydrochlorothiazide (MICROZIDE) 12.5 MG capsule    Sig: Take 1 capsule (12.5 mg total) by mouth daily.    Dispense:  90 capsule    Refill:  1  . furosemide (LASIX) 20 MG tablet    Sig: 1 tab po daily and a second tab daily prn pedal edema or weight gain of >3# in 24 hours    Dispense:  110 tablet    Refill:  1  . montelukast (SINGULAIR) 10 MG tablet    Sig: Take 1 tablet (10 mg total) by mouth at bedtime as needed (allergies).    Dispense:  30 tablet    Refill:  3  . rosuvastatin (CRESTOR) 5 MG tablet    Sig: Take 1 tablet (5 mg total) by mouth daily.    Dispense:  90 tablet    Refill:  1  . famotidine (PEPCID) 20 MG tablet    Sig: Take 1 tablet (20 mg total) by mouth 2 (two) times daily.    Dispense:  180 tablet    Refill:  1  . amLODipine (NORVASC) 2.5 MG tablet    Sig: Take 1 tablet (2.5 mg total) by mouth daily.    Dispense:  90 tablet    Refill:  1  . benzonatate (TESSALON) 100 MG capsule    Sig: Take 1 capsule (100 mg total) by mouth 2 (two) times daily as needed for cough.    Dispense:  30 capsule    Refill:  3  . albuterol (VENTOLIN HFA) 108 (90 Base) MCG/ACT inhaler    Sig: Inhale 2 puffs into the lungs every 6 (six) hours as needed for wheezing or shortness of breath.    Dispense:  1 Inhaler    Refill:  2  . DISCONTD: potassium chloride SA (K-DUR) 20 MEQ tablet    Sig: Take 1 tablet (20 mEq total) by mouth daily as needed (second potassium pill daily).    Dispense:   30 tablet    Refill:  3  . potassium chloride SA (K-DUR) 20 MEQ tablet    Sig: Take 1 tablet (20 mEq total) by mouth daily as needed (second potassium pill daily).    Dispense:  20 tablet    Refill:  1     Danise Edge, MD  I discussed the assessment and treatment plan with the patient. The patient was provided an opportunity to ask questions and all were answered. The patient agreed with the plan and demonstrated an understanding of the instructions.   The patient was advised to call back or seek an in-person evaluation if the symptoms worsen or if the condition fails to improve as anticipated.  I provided 25 minutes of non-face-to-face time during this encounter.   Penni Homans, MD

## 2018-09-16 NOTE — Assessment & Plan Note (Signed)
Refill given on Singulair current meds working well

## 2018-09-16 NOTE — Assessment & Plan Note (Signed)
Improved with treatments. Refilled Famotidine today

## 2018-09-19 ENCOUNTER — Telehealth: Payer: Self-pay | Admitting: *Deleted

## 2018-09-19 ENCOUNTER — Ambulatory Visit: Payer: Medicare HMO | Admitting: Orthopedic Surgery

## 2018-09-19 ENCOUNTER — Other Ambulatory Visit: Payer: Self-pay

## 2018-09-19 DIAGNOSIS — M1712 Unilateral primary osteoarthritis, left knee: Secondary | ICD-10-CM

## 2018-09-19 NOTE — Telephone Encounter (Signed)
Copied from CRM [id number]. Topic: General - Other >> Sep 18, 2018  2:08 PM Percival Spanish wrote: [phone]    [id number] [facility] has questions about the below RX and req a call back    hydrochlorothiazide (MICROZIDE) 12.5 MG capsule   potassium chloride SA (K-DUR) 20 MEQ tablet

## 2018-09-19 NOTE — Telephone Encounter (Signed)
Got it was suppored to say second Furosemide pill. Please adjust

## 2018-09-19 NOTE — Telephone Encounter (Signed)
Spoke with pharmacist, Barbara Cower and they no have no questions regarding HCTZ Rx. They do need clarification on Potassium RX. Directions state, take 1 tablet daily as needed (second potassium pill daily). Please verify how pt should be taking medication?

## 2018-09-20 ENCOUNTER — Encounter: Payer: Self-pay | Admitting: Orthopedic Surgery

## 2018-09-20 ENCOUNTER — Telehealth: Payer: Self-pay

## 2018-09-20 DIAGNOSIS — M1712 Unilateral primary osteoarthritis, left knee: Secondary | ICD-10-CM

## 2018-09-20 NOTE — Telephone Encounter (Signed)
Can you please add Gel one for this patient? Done yesterday in left knee? Dr August Saucer said that he did not see it in [facility]

## 2018-09-20 NOTE — Progress Notes (Signed)
   Procedure Note  Patient: Abigail Mcpherson             Date of Birth: [birth year]           MRN: [id number]             Visit Date: 09/19/2018  Procedures: Visit Diagnoses: Unilateral primary osteoarthritis, left knee  Large Joint Inj: L knee on 09/20/2018 9:08 AM Indications: diagnostic evaluation, joint swelling and pain Details: 18 G 1.5 in needle, superolateral approach  Arthrogram: No  Medications: 30 mg Cross-Linked Hyaluronate 30 MG/3ML; 5 mL lidocaine 1 % Outcome: tolerated well, no immediate complications Procedure, treatment alternatives, risks and benefits explained, specific risks discussed. Consent was given by the patient. Immediately prior to procedure a time out was called to verify the correct patient, procedure, equipment, support staff and site/side marked as required. Patient was prepped and draped in the usual sterile fashion.

## 2018-09-20 NOTE — Telephone Encounter (Signed)
Have him sign it now, it just needed the [facility] number.  I have added it.

## 2018-09-25 ENCOUNTER — Other Ambulatory Visit: Payer: Self-pay

## 2018-09-25 ENCOUNTER — Other Ambulatory Visit (INDEPENDENT_AMBULATORY_CARE_PROVIDER_SITE_OTHER): Payer: Medicare HMO

## 2018-09-25 DIAGNOSIS — E119 Type 2 diabetes mellitus without complications: Secondary | ICD-10-CM

## 2018-09-25 DIAGNOSIS — E785 Hyperlipidemia, unspecified: Secondary | ICD-10-CM

## 2018-09-25 DIAGNOSIS — I10 Essential (primary) hypertension: Secondary | ICD-10-CM

## 2018-09-25 LAB — CBC
HCT: 39.5 % (ref 36.0–46.0)
Hemoglobin: 12.6 g/dL (ref 12.0–15.0)
MCHC: 31.9 g/dL (ref 30.0–36.0)
MCV: 91 fl (ref 78.0–100.0)
Platelets: 248 10*3/uL (ref 150.0–400.0)
RBC: 4.34 Mil/uL (ref 3.87–5.11)
RDW: 15.1 % (ref 11.5–15.5)
WBC: 5.4 10*3/uL (ref 4.0–10.5)

## 2018-09-25 LAB — COMPREHENSIVE METABOLIC PANEL
ALT: 9 U/L (ref 0–35)
AST: 12 U/L (ref 0–37)
Albumin: 4.4 g/dL (ref 3.5–5.2)
Alkaline Phosphatase: 85 U/L (ref 39–117)
BUN: 22 mg/dL (ref 6–23)
CO2: 31 mEq/L (ref 19–32)
Calcium: 9.5 mg/dL (ref 8.4–10.5)
Chloride: 102 mEq/L (ref 96–112)
Creatinine, Ser: 0.85 mg/dL (ref 0.40–1.20)
GFR: 77.13 mL/min (ref >60.00)
Glucose, Bld: 123 mg/dL — ABNORMAL HIGH (ref 70–99)
Potassium: 4.8 mEq/L (ref 3.5–5.1)
Sodium: 139 mEq/L (ref 135–145)
Total Bilirubin: 0.4 mg/dL (ref 0.2–1.2)
Total Protein: 6.9 g/dL (ref 6.0–8.3)

## 2018-09-25 LAB — LIPID PANEL
Cholesterol: 165 mg/dL (ref 0–200)
HDL: 68.7 mg/dL (ref >39.00)
LDL Cholesterol: 74 mg/dL (ref 0–99)
NonHDL: 96.5
Total CHOL/HDL Ratio: 2
Triglycerides: 113 mg/dL (ref 0.0–149.0)
VLDL: 22.6 mg/dL (ref 0.0–40.0)

## 2018-09-25 LAB — TSH: TSH: 1.7 u[IU]/mL (ref 0.35–4.50)

## 2018-09-25 LAB — HEMOGLOBIN A1C: Hgb A1c MFr Bld: 6.2 % (ref 4.6–6.5)

## 2018-09-25 LAB — FERRITIN: Ferritin: 16.8 ng/mL (ref 10.0–291.0)

## 2018-09-25 NOTE — Addendum Note (Signed)
Addended by: Magdalene Molly A on: 09/25/2018 09:29 AM   Modules accepted: Orders

## 2018-09-27 DIAGNOSIS — I10 Essential (primary) hypertension: Secondary | ICD-10-CM | POA: Diagnosis not present

## 2018-09-27 DIAGNOSIS — E1142 Type 2 diabetes mellitus with diabetic polyneuropathy: Secondary | ICD-10-CM | POA: Diagnosis not present

## 2018-09-27 DIAGNOSIS — G4730 Sleep apnea, unspecified: Secondary | ICD-10-CM | POA: Diagnosis not present

## 2018-09-27 DIAGNOSIS — E663 Overweight: Secondary | ICD-10-CM | POA: Diagnosis not present

## 2018-09-27 DIAGNOSIS — J410 Simple chronic bronchitis: Secondary | ICD-10-CM | POA: Diagnosis not present

## 2018-09-27 DIAGNOSIS — E785 Hyperlipidemia, unspecified: Secondary | ICD-10-CM | POA: Diagnosis not present

## 2018-09-27 DIAGNOSIS — J302 Other seasonal allergic rhinitis: Secondary | ICD-10-CM | POA: Diagnosis not present

## 2018-09-27 DIAGNOSIS — L602 Onychogryphosis: Secondary | ICD-10-CM | POA: Diagnosis not present

## 2018-09-27 DIAGNOSIS — M170 Bilateral primary osteoarthritis of knee: Secondary | ICD-10-CM | POA: Diagnosis not present

## 2018-09-27 DIAGNOSIS — K219 Gastro-esophageal reflux disease without esophagitis: Secondary | ICD-10-CM | POA: Diagnosis not present

## 2018-09-28 DIAGNOSIS — E1165 Type 2 diabetes mellitus with hyperglycemia: Secondary | ICD-10-CM | POA: Diagnosis not present

## 2018-09-29 ENCOUNTER — Encounter: Payer: Self-pay | Admitting: Family Medicine

## 2018-10-01 MED ORDER — POTASSIUM CHLORIDE CRYS ER 20 MEQ PO TBCR: 20.0000 meq | EXTENDED_RELEASE_TABLET | Freq: Every day | ORAL | 1 refills | Status: DC | PRN

## 2018-10-01 MED ORDER — HYDROCHLOROTHIAZIDE 12.5 MG PO CAPS: 12.5000 mg | ORAL_CAPSULE | Freq: Every day | ORAL | 1 refills | Status: DC

## 2018-10-05 ENCOUNTER — Other Ambulatory Visit: Payer: Self-pay

## 2018-10-05 MED ORDER — POTASSIUM CHLORIDE CRYS ER 20 MEQ PO TBCR: 20.0000 meq | EXTENDED_RELEASE_TABLET | Freq: Every day | ORAL | 1 refills | Status: DC

## 2018-10-18 ENCOUNTER — Ambulatory Visit (INDEPENDENT_AMBULATORY_CARE_PROVIDER_SITE_OTHER): Payer: Medicare HMO | Admitting: Otolaryngology

## 2018-10-18 DIAGNOSIS — K219 Gastro-esophageal reflux disease without esophagitis: Secondary | ICD-10-CM

## 2018-10-18 DIAGNOSIS — R05 Cough: Secondary | ICD-10-CM

## 2018-10-18 MED ORDER — LIDOCAINE HCL 1 % IJ SOLN
5.0000 mL | INTRAMUSCULAR | Status: AC | PRN
  Administered 2018-09-20: 09:00:00 5 mL

## 2018-10-18 MED ORDER — CROSS-LINK HYAL ACID (VISC) 30 MG/3ML IX PRSY
30.0000 mg | PREFILLED_SYRINGE | INTRA_ARTICULAR | Status: AC | PRN
  Administered 2018-09-20: 09:00:00 30 mg via INTRA_ARTICULAR

## 2018-12-07 DIAGNOSIS — H5203 Hypermetropia, bilateral: Secondary | ICD-10-CM | POA: Diagnosis not present

## 2018-12-20 ENCOUNTER — Ambulatory Visit (INDEPENDENT_AMBULATORY_CARE_PROVIDER_SITE_OTHER): Payer: Medicare HMO | Admitting: Orthopedic Surgery

## 2018-12-20 ENCOUNTER — Encounter: Payer: Self-pay | Admitting: Orthopedic Surgery

## 2018-12-20 DIAGNOSIS — M1712 Unilateral primary osteoarthritis, left knee: Secondary | ICD-10-CM

## 2018-12-20 MED ORDER — LIDOCAINE HCL 1 % IJ SOLN
5.0000 mL | INTRAMUSCULAR | Status: AC | PRN
  Administered 2018-12-20: 12:00:00 5 mL

## 2018-12-20 MED ORDER — METHYLPREDNISOLONE ACETATE 40 MG/ML IJ SUSP
40.0000 mg | INTRAMUSCULAR | Status: AC | PRN
  Administered 2018-12-20: 40 mg via INTRA_ARTICULAR

## 2018-12-20 MED ORDER — BUPIVACAINE HCL 0.25 % IJ SOLN
4.0000 mL | INTRAMUSCULAR | Status: AC | PRN
  Administered 2018-12-20: 4 mL via INTRA_ARTICULAR

## 2018-12-20 NOTE — Progress Notes (Signed)
Office Visit Note   Patient: Abigail Mcpherson           Date of Birth: 1932-10-13           MRN: [id number] Visit Date: 12/20/2018 Requested by: Bradd CanaryBlyth, Stacey A, MD [address] [place],  [place] [id number] PCP: Bradd CanaryBlyth, Stacey A, MD  Subjective: Chief Complaint  Patient presents with   Left Knee - Pain    HPI: Abigail Mcpherson is a 85 y.o. female who presents to the office complaining of L knee pain. Pt had a gel injection on 09/19/18 that provided relief until last week when her pain returned.  She localizes pain to the medial jointline and states that she has been using ice and tylenol with some relief of pain.  Pain is worse with walking and she has to take a break every couple minutes due to knee pain. She has had 2 arthroscopic surgeries on her L knee.  Denies groin pain/low back pain/radicular symptoms.  Denies numbness/tingling or mechanical knee symptoms.                 ROS:  All systems reviewed are negative as they relate to the chief complaint within the history of present illness.  Patient denies  fevers or chills.   Assessment & Plan: Visit Diagnoses: No diagnosis found.  Plan: Pt is a 85 y.o. Female who presents with history of chronic L knee pain. She had relief of pain from Synvisc injection 3 months ago.  Her symptoms have returned and she requested another injection.  Too soon for another gel injection, so offered cortisone injection to patient.  Pt agreed with plan and tolerated procedure well.  She will f/u with the clinic in 3 months for gel injection.    This patient is diagnosed with osteoarthritis of the knee(s).    Radiographs show evidence of joint space narrowing, osteophytes, subchondral sclerosis and/or subchondral cysts.  This patient has knee pain which interferes with functional and activities of daily living.    This patient has experienced inadequate response, adverse effects and/or intolerance with conservative treatments such as  acetaminophen, NSAIDS, topical creams, physical therapy or regular exercise, knee bracing and/or weight loss.   This patient has experienced inadequate response or has a contraindication to intra articular steroid injections for at least 3 months.   This patient is not scheduled to have a total knee replacement within 6 months of starting treatment with viscosupplementation.   Follow-Up Instructions: No follow-ups on file.   Orders:  No orders of the defined types were placed in this encounter.  No orders of the defined types were placed in this encounter.     Procedures: Large Joint Inj on 12/20/2018 11:53 AM Indications: diagnostic evaluation, joint swelling and pain Details: 18 G 1.5 in needle, superolateral approach  Arthrogram: No  Medications: 5 mL lidocaine 1 %; 40 mg methylPREDNISolone acetate 40 MG/ML; 4 mL bupivacaine 0.25 % Outcome: tolerated well, no immediate complications Procedure, treatment alternatives, risks and benefits explained, specific risks discussed. Consent was given by the patient. Immediately prior to procedure a time out was called to verify the correct patient, procedure, equipment, support staff and site/side marked as required. Patient was prepped and draped in the usual sterile fashion.       Clinical Data: No additional findings.  Objective: Vital Signs: There were no vitals taken for this visit.  Physical Exam:    Constitutional: Patient appears well-developed HEENT:  Head: Normocephalic Eyes:EOM  are normal Neck: Normal range of motion Cardiovascular: Normal rate Pulmonary/chest: Effort normal Neurologic: Patient is alert Skin: Skin is warm Psychiatric: Patient has normal mood and affect    Ortho Exam:  L knee exam No effusion Extensor mechanism intact No TTP over the lateral jointlines, quad tendon, patellar tendon, pes anserinus, patella, tibial tubercle, LCL/MCL insertions TTP over the medial jointline Stable to  varus/valgus stresses.  Stable to anterior/posterior drawer Extension to 5 degrees Flexion > 90 degrees   Specialty Comments:  No specialty comments available.  Imaging: No results found.   PMFS History: Patient Active Problem List   Diagnosis Date Noted   Pedal edema 08/21/2018   Cough 05/29/2018   Urinary incontinence 05/29/2018   Knee pain, bilateral 05/29/2018   Allergy    History of shingles    Diabetes mellitus without complication ([place])    Hyperlipidemia    Hypertension    Cataract    Past Medical History:  Diagnosis Date   Allergy    Cataract    Diabetes mellitus without complication ([place])    GERD (gastroesophageal reflux disease)    History of shingles    Hyperlipidemia    Hypertension     Family History  Problem Relation Age of Onset   Diabetes Mother    Hypertension Mother    Hernia Mother        inguinal   Diabetes Father    Stroke Son    Aneurysm Paternal Aunt        brain   Cancer Sister        intestinal   Hypertension Sister    Diabetes Sister    Glaucoma Sister    Diabetes Sister    Hypertension Sister    Hyperlipidemia Sister    Diabetes Sister    Hypertension Sister    Hyperlipidemia Sister    Hernia Sister        umbilical   Hyperlipidemia Sister    Hypertension Sister    Diabetes Sister    Alcohol abuse Sister    Diabetes Sister    Hyperlipidemia Sister    Hypertension Sister    Obesity Sister     Past Surgical History:  Procedure Laterality Date   APPENDECTOMY     BREAST SURGERY     breast biopsy left , b/l lumpectomy 1969   BUNIONECTOMY Bilateral    CESAREAN SECTION     Social History   Occupational History   Occupation: Marine scientist    Comment: retired  Tobacco Use   Smoking status: Former Smoker   Smokeless tobacco: Never Used  Substance and Sexual Activity   Alcohol use: Yes   Drug use: Yes   Sexual activity: Not Currently

## 2018-12-21 DIAGNOSIS — E1165 Type 2 diabetes mellitus with hyperglycemia: Secondary | ICD-10-CM | POA: Diagnosis not present

## 2018-12-25 DIAGNOSIS — E1165 Type 2 diabetes mellitus with hyperglycemia: Secondary | ICD-10-CM | POA: Diagnosis not present

## 2018-12-27 ENCOUNTER — Ambulatory Visit (INDEPENDENT_AMBULATORY_CARE_PROVIDER_SITE_OTHER): Payer: Medicare HMO | Admitting: Otolaryngology

## 2018-12-27 DIAGNOSIS — R05 Cough: Secondary | ICD-10-CM

## 2018-12-27 DIAGNOSIS — K219 Gastro-esophageal reflux disease without esophagitis: Secondary | ICD-10-CM | POA: Diagnosis not present

## 2019-01-31 ENCOUNTER — Other Ambulatory Visit: Payer: Self-pay | Admitting: Family Medicine

## 2019-02-10 ENCOUNTER — Other Ambulatory Visit: Payer: Self-pay | Admitting: Family Medicine

## 2019-03-17 ENCOUNTER — Other Ambulatory Visit: Payer: Self-pay | Admitting: Family Medicine

## 2019-03-18 DIAGNOSIS — E1165 Type 2 diabetes mellitus with hyperglycemia: Secondary | ICD-10-CM | POA: Diagnosis not present

## 2019-03-20 ENCOUNTER — Other Ambulatory Visit: Payer: Self-pay | Admitting: Family Medicine

## 2019-03-22 ENCOUNTER — Other Ambulatory Visit: Payer: Self-pay

## 2019-03-22 ENCOUNTER — Ambulatory Visit: Payer: Medicare HMO | Admitting: Orthopedic Surgery

## 2019-03-22 ENCOUNTER — Encounter: Payer: Self-pay | Admitting: Orthopedic Surgery

## 2019-03-22 ENCOUNTER — Telehealth: Payer: Self-pay

## 2019-03-22 DIAGNOSIS — M1712 Unilateral primary osteoarthritis, left knee: Secondary | ICD-10-CM

## 2019-03-22 NOTE — Telephone Encounter (Signed)
Submitted VOB for Gel-One, Left Knee.

## 2019-03-22 NOTE — Progress Notes (Signed)
   Procedure Note  Patient: Abigail Mcpherson             Date of Birth: Oct 19, 1932           MRN: [id number]             Visit Date: 03/22/2019  Procedures: Visit Diagnoses:  1. Unilateral primary osteoarthritis, left knee     Large Joint Inj: L knee on 03/22/2019 11:56 AM Indications: diagnostic evaluation, joint swelling and pain Details: 18 G 1.5 in needle, superolateral approach  Arthrogram: No  Medications: 4 mL bupivacaine 0.25 %; 5 mL lidocaine 1 %; 40 mg methylPREDNISolone acetate 40 MG/ML Outcome: tolerated well, no immediate complications  Patient returns for scheduled knee injection.  Patient is also had relief with gel injections in the past with her last gel injection in June 2020.  We will preapproved patient for gel injections.  She will follow-up in 3 months for another scheduled injection.   This patient is diagnosed with osteoarthritis of the knee(s).   Radiographs show evidence of joint space narrowing, osteophytes, subchondral sclerosis and/or subchondral cysts. This patient has knee pain which interferes with functional and activities of daily living.   This patient has experienced inadequate response, adverse effects and/or intolerance with conservative treatments such as acetaminophen, NSAIDS, topical creams, physical therapy or regular exercise, knee bracing and/or weight loss.  This patient has experienced inadequate response or has a contraindication to intra articular steroid injections for at least 3 months.   This patient is not scheduled to have a total knee replacement within 6 months of starting treatment with viscosupplementation.  Procedure, treatment alternatives, risks and benefits explained, specific risks discussed. Consent was given by the patient. Immediately prior to procedure a time out was called to verify the correct patient, procedure, equipment, support staff and site/side marked as required. Patient was prepped and draped in the  usual sterile fashion.

## 2019-03-23 ENCOUNTER — Encounter: Payer: Self-pay | Admitting: Orthopedic Surgery

## 2019-03-23 MED ORDER — METHYLPREDNISOLONE ACETATE 40 MG/ML IJ SUSP
40.0000 mg | INTRAMUSCULAR | Status: AC | PRN
  Administered 2019-03-22: 12:00:00 40 mg via INTRA_ARTICULAR

## 2019-03-23 MED ORDER — LIDOCAINE HCL 1 % IJ SOLN
5.0000 mL | INTRAMUSCULAR | Status: AC | PRN
  Administered 2019-03-22: 5 mL

## 2019-03-23 MED ORDER — BUPIVACAINE HCL 0.25 % IJ SOLN
4.0000 mL | INTRAMUSCULAR | Status: AC | PRN
  Administered 2019-03-22: 12:00:00 4 mL via INTRA_ARTICULAR

## 2019-03-27 ENCOUNTER — Other Ambulatory Visit: Payer: Self-pay | Admitting: Family Medicine

## 2019-03-29 ENCOUNTER — Telehealth: Payer: Self-pay

## 2019-03-29 IMAGING — DX DG KNEE COMPLETE 4+V*L*
4 series · 4 of 4 positions shown · non-contrast
Comparison: None.

CLINICAL DATA: Chronic bilateral knee pain and swelling for years.
Recent worsening of symptoms. No acute injury.

EXAM:
LEFT KNEE - COMPLETE 4+ VIEW

[knee ap]
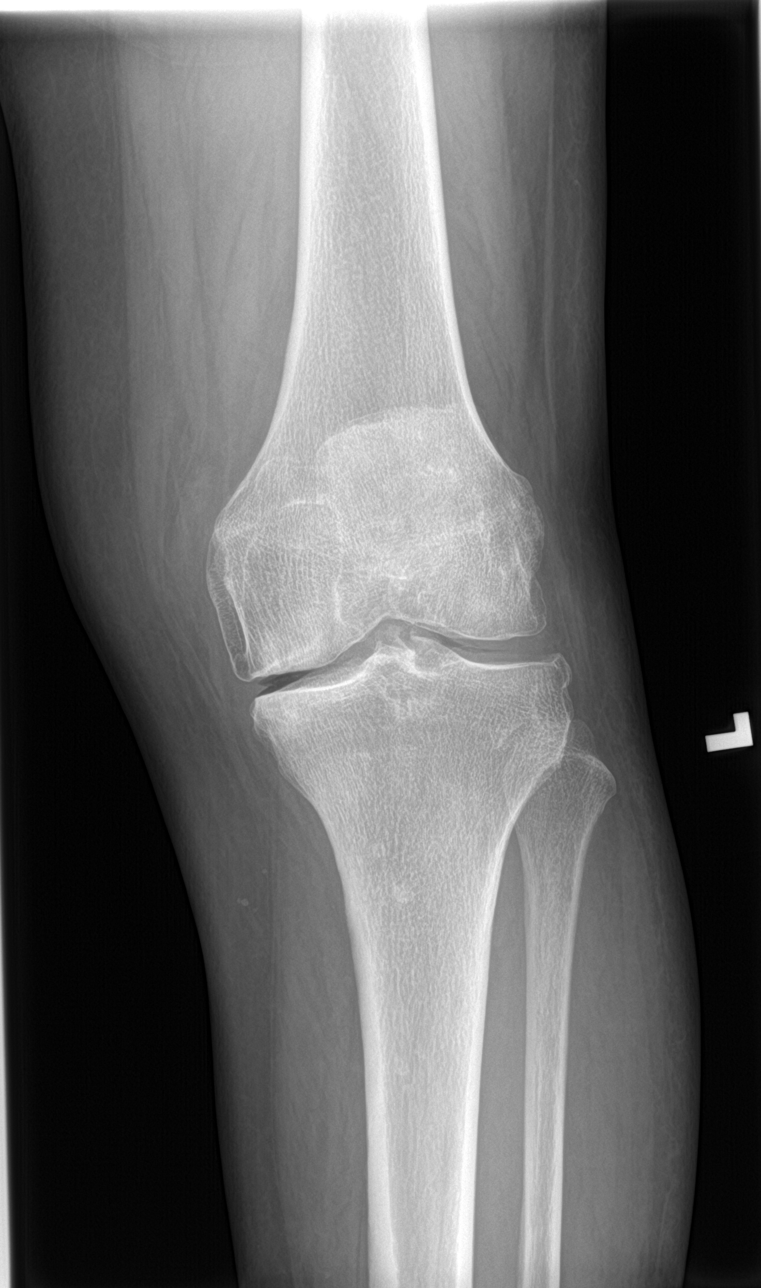

[knee lat]
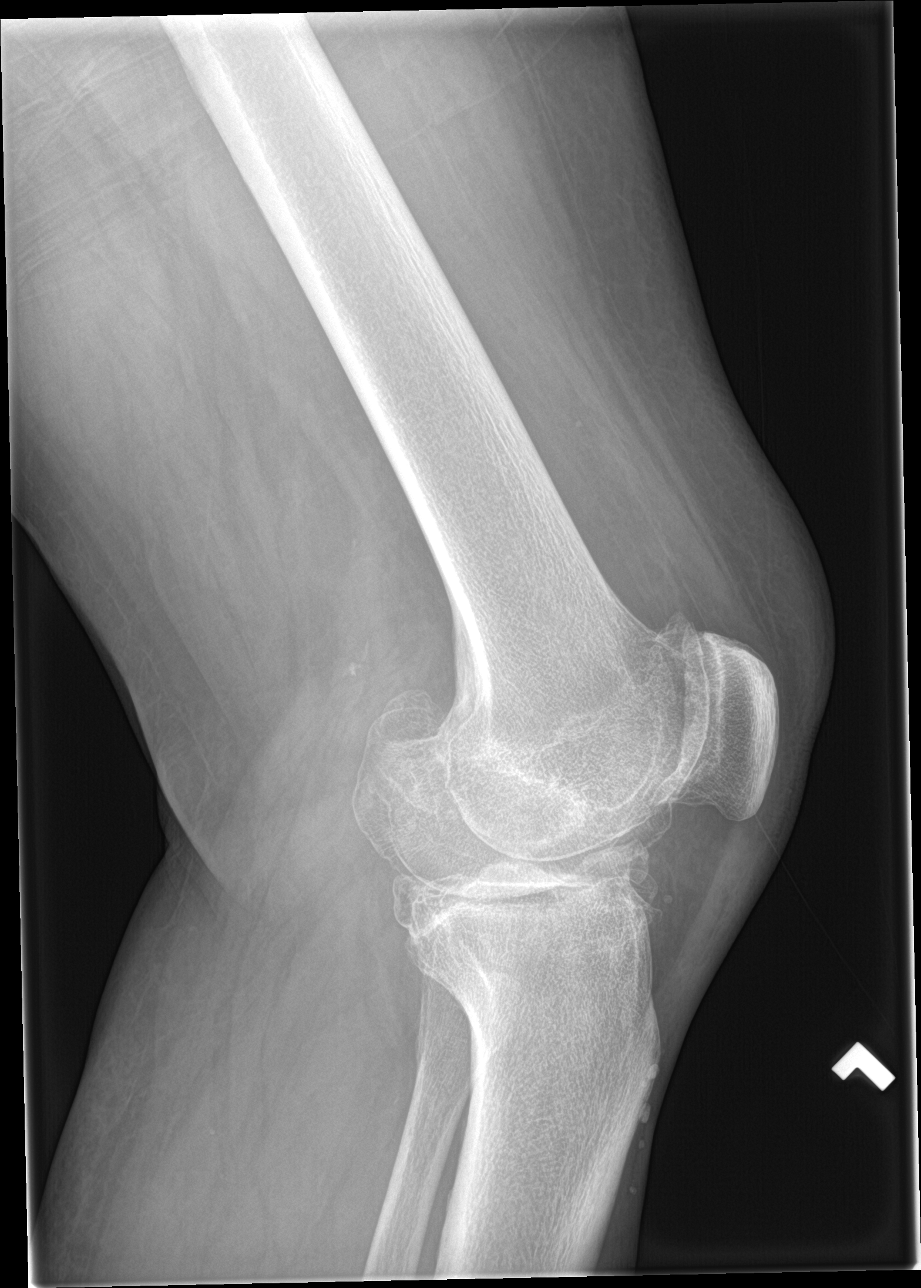

[knee obl (1 of 2)]
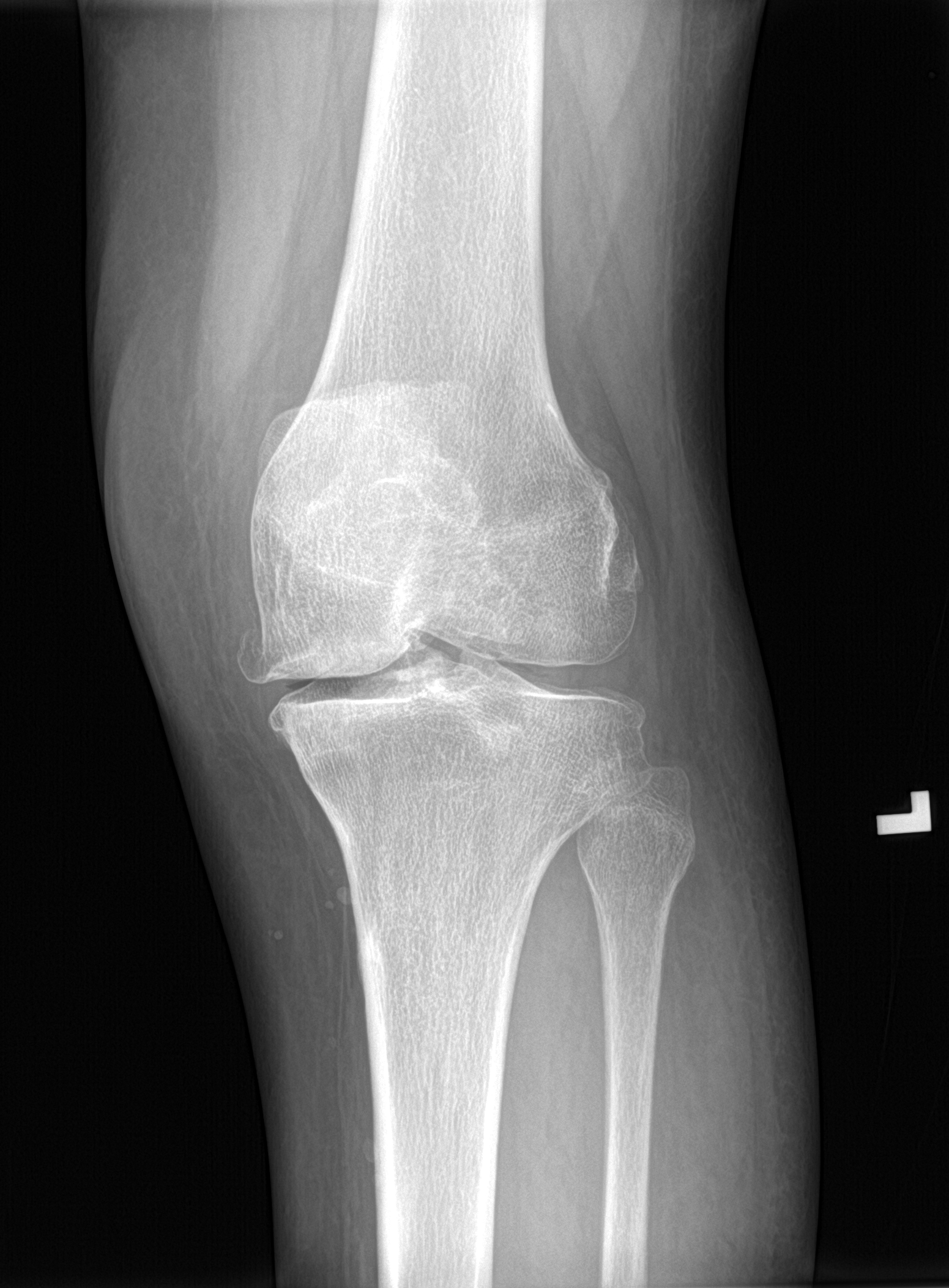

[knee obl (2 of 2)]
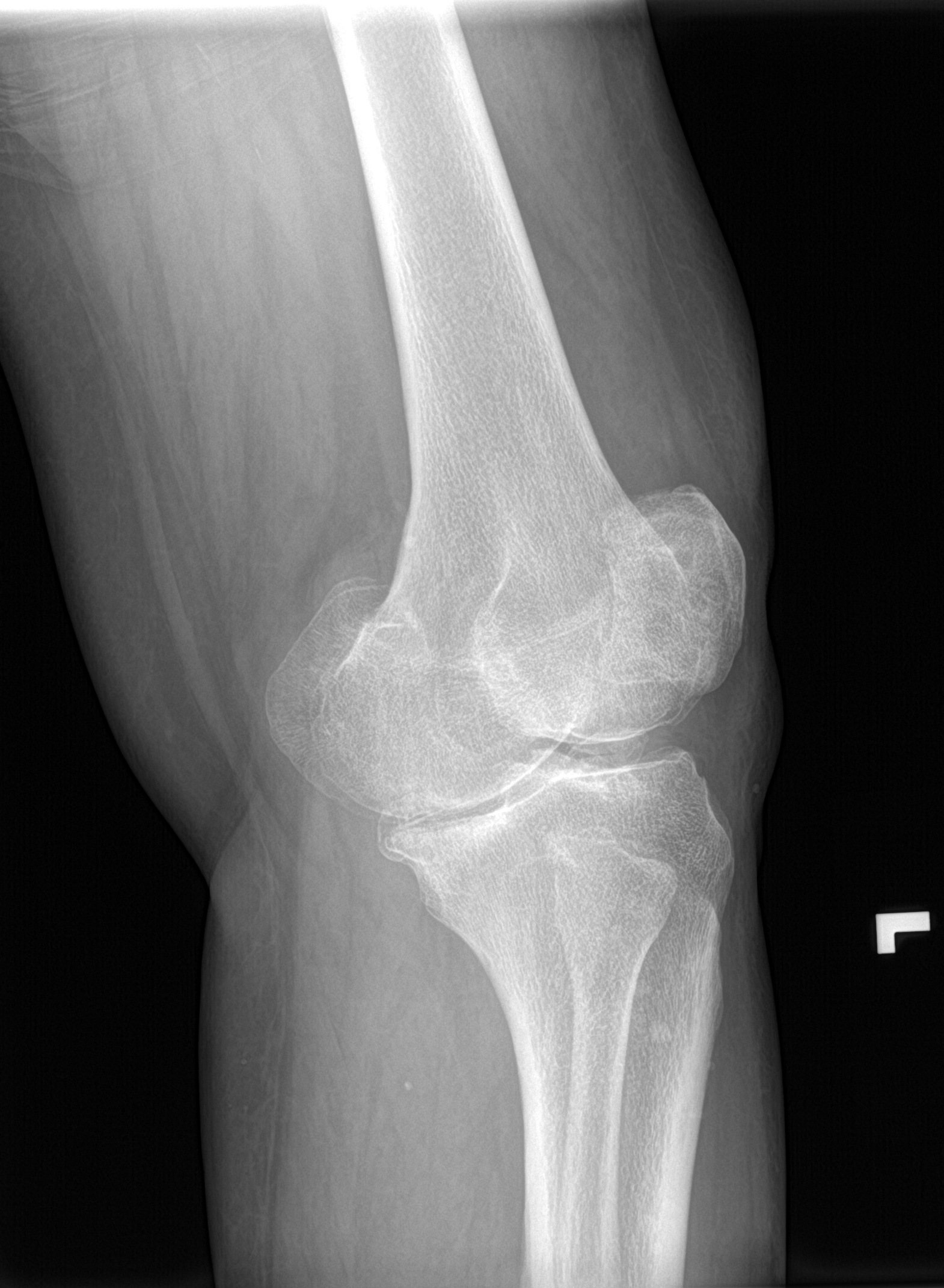

[4 of 4 positions shown; findings below may reference images not displayed]

FINDINGS: The mineralization and alignment are normal. There is no evidence of
acute fracture or dislocation. There are advanced tricompartmental
degenerative changes with joint space narrowing and osteophytes.
Vacuum phenomenon is present in the medial and lateral compartments.
There is no significant joint effusion or definite loose body.
IMPRESSION: Advanced tricompartmental osteoarthritis. No acute osseous findings.

## 2019-03-29 NOTE — Telephone Encounter (Signed)
Called and left a VM for patient to call back to schedule an appointment with Dr. Marlou Sa for gel injection due to limited appointments.

## 2019-04-01 ENCOUNTER — Telehealth: Payer: Self-pay | Admitting: Orthopedic Surgery

## 2019-04-01 NOTE — Telephone Encounter (Signed)
Tried calling patient on Friday, 03/29/2019 to schedule an appointment for gel-one injection and left a VM for her to return my call.  Will call patient back.  Thank you.

## 2019-04-01 NOTE — Telephone Encounter (Signed)
See note below. Had you tried to call her regarding gel injection?

## 2019-04-01 NOTE — Telephone Encounter (Signed)
Patient called requesting Clinical staff April Jackson give her a call back. Patient phone number is [phone].

## 2019-04-03 ENCOUNTER — Telehealth: Payer: Self-pay

## 2019-04-03 NOTE — Telephone Encounter (Signed)
Approved for Gel-One, left knee. [place] Patient covered at 100% through her insurance. Co-pay of $10.00 PA required PA Approval# [id number] Valid 04/02/2019- 07/01/2019  Appt. 04/10/2019 with Dr. Marlou Sa

## 2019-04-10 ENCOUNTER — Ambulatory Visit: Payer: Medicare HMO | Admitting: Orthopedic Surgery

## 2019-04-10 ENCOUNTER — Other Ambulatory Visit: Payer: Self-pay

## 2019-04-10 DIAGNOSIS — M1712 Unilateral primary osteoarthritis, left knee: Secondary | ICD-10-CM

## 2019-04-11 ENCOUNTER — Encounter: Payer: Self-pay | Admitting: Orthopedic Surgery

## 2019-04-11 DIAGNOSIS — M1712 Unilateral primary osteoarthritis, left knee: Secondary | ICD-10-CM | POA: Diagnosis not present

## 2019-04-11 NOTE — Progress Notes (Signed)
   Procedure Note  Patient: Abigail Mcpherson             Date of Birth: 09-24-1932           MRN: [id number]             Visit Date: 04/10/2019  Procedures: Visit Diagnoses:  1. Unilateral primary osteoarthritis, left knee     Large Joint Inj: L knee on 04/11/2019 7:21 AM Indications: diagnostic evaluation, joint swelling and pain Details: 18 G 1.5 in needle, superolateral approach  Arthrogram: No  Medications: 5 mL lidocaine 1 %; 30 mg Cross-Linked Hyaluronate 30 MG/3ML Outcome: tolerated well, no immediate complications Procedure, treatment alternatives, risks and benefits explained, specific risks discussed. Consent was given by the patient. Immediately prior to procedure a time out was called to verify the correct patient, procedure, equipment, support staff and site/side marked as required. Patient was prepped and draped in the usual sterile fashion.

## 2019-05-10 ENCOUNTER — Telehealth: Payer: Self-pay | Admitting: Family Medicine

## 2019-05-10 MED ORDER — MONTELUKAST SODIUM 10 MG PO TABS: 10.0000 mg | ORAL_TABLET | Freq: Every evening | ORAL | 3 refills | Status: DC | PRN

## 2019-05-10 NOTE — Telephone Encounter (Signed)
Sent medications sent in

## 2019-05-10 NOTE — Telephone Encounter (Signed)
Patient would like a Singulair Rx before her appointment with Dr.  Has the patient contacted their pharmacy? No.

## 2019-06-10 ENCOUNTER — Other Ambulatory Visit: Payer: Self-pay

## 2019-06-10 ENCOUNTER — Encounter: Payer: Self-pay | Admitting: Family Medicine

## 2019-06-10 ENCOUNTER — Ambulatory Visit (INDEPENDENT_AMBULATORY_CARE_PROVIDER_SITE_OTHER): Payer: Medicare HMO | Admitting: Family Medicine

## 2019-06-10 VITALS — BP 134/68 | HR 79 | Temp 97.7°F | Ht 62.0 in | Wt 162.0 lb

## 2019-06-10 DIAGNOSIS — E785 Hyperlipidemia, unspecified: Secondary | ICD-10-CM | POA: Diagnosis not present

## 2019-06-10 DIAGNOSIS — E119 Type 2 diabetes mellitus without complications: Secondary | ICD-10-CM

## 2019-06-10 DIAGNOSIS — I10 Essential (primary) hypertension: Secondary | ICD-10-CM

## 2019-06-10 MED ORDER — MONTELUKAST SODIUM 10 MG PO TABS: 10.0000 mg | ORAL_TABLET | Freq: Every evening | ORAL | 3 refills | Status: DC | PRN

## 2019-06-10 MED ORDER — BENZONATATE 100 MG PO CAPS: ORAL_CAPSULE | ORAL | 5 refills | Status: DC

## 2019-06-10 MED ORDER — HYDROCHLOROTHIAZIDE 12.5 MG PO CAPS: 12.5000 mg | ORAL_CAPSULE | Freq: Every day | ORAL | 3 refills | Status: DC

## 2019-06-10 NOTE — Assessment & Plan Note (Signed)
Encouraged heart healthy diet, increase exercise, avoid trans fats, consider a krill oil cap daily. Tolerating Rosuvastatin 

## 2019-06-10 NOTE — Progress Notes (Signed)
Virtual Visit via phone Note  I connected with Abigail Mcpherson on 06/10/19 at 10:40 AM EST by a phone enabled telemedicine application and verified that I am speaking with the correct person using two identifiers.  Location: Patient: home Provider: office   I discussed the limitations of evaluation and management by telemedicine and the availability of in person appointments. The patient expressed understanding and agreed to proceed. Robin Ewing, CMA was able to get the patient set up on a phone visit after being unable to set up a video visit   Subjective:    Patient ID: Abigail Mcpherson, female    DOB: 1932-11-08, 86 y.o.   MRN: [id number]  Chief Complaint  Patient presents with  . Follow-up    BS today 164    HPI Patient is in today for follow up on chronic medical concerns. She is accompanied by her daughter. Her sugars are doing well. No polyuria or polydipsia. They are trying to maintain a low carb diet. No recent febrile illness or hospitalizations. Denies CP/palp/SOB/HA/congestion/fevers/GI or GU c/o. Taking meds as prescribed  Past Medical History:  Diagnosis Date  . Allergy   . Cataract   . Diabetes mellitus without complication (HCC)   . GERD (gastroesophageal reflux disease)   . History of shingles   . Hyperlipidemia   . Hypertension     Past Surgical History:  Procedure Laterality Date  . APPENDECTOMY    . BREAST SURGERY     breast biopsy left , b/l lumpectomy 1969  . BUNIONECTOMY Bilateral   . CESAREAN SECTION      Family History  Problem Relation Age of Onset  . Diabetes Mother   . Hypertension Mother   . Hernia Mother        inguinal  . Diabetes Father   . Stroke Son   . Aneurysm Paternal Aunt        brain  . Cancer Sister        intestinal  . Hypertension Sister   . Diabetes Sister   . Glaucoma Sister   . Diabetes Sister   . Hypertension Sister   . Hyperlipidemia Sister   . Diabetes Sister   . Hypertension Sister   . Hyperlipidemia  Sister   . Hernia Sister        umbilical  . Hyperlipidemia Sister   . Hypertension Sister   . Diabetes Sister   . Alcohol abuse Sister   . Diabetes Sister   . Hyperlipidemia Sister   . Hypertension Sister   . Obesity Sister     Social History   Socioeconomic History  . Marital status: Divorced    Spouse name: Not on file  . Number of children: Not on file  . Years of education: Not on file  . Highest education level: Not on file  Occupational History  . Occupation: Nurse    Comment: retired  Tobacco Use  . Smoking status: Former Games developer  . Smokeless tobacco: Never Used  Substance and Sexual Activity  . Alcohol use: Yes  . Drug use: Yes  . Sexual activity: Not Currently  Other Topics Concern  . Not on file  Social History Narrative   Avoids pork and shellfish and deep sea fish. Lives with family, daughter, wears seat belt always. Divorced, retired Engineer, civil (consulting)   Social Determinants of Corporate investment banker Strain:   . Difficulty of Paying Living Expenses: Not on file  Food Insecurity:   . Worried About Programme researcher, broadcasting/film/video in  the Last Year: Not on file  . Ran Out of Food in the Last Year: Not on file  Transportation Needs:   . Lack of Transportation (Medical): Not on file  . Lack of Transportation (Non-Medical): Not on file  Physical Activity:   . Days of Exercise per Week: Not on file  . Minutes of Exercise per Session: Not on file  Stress:   . Feeling of Stress : Not on file  Social Connections:   . Frequency of Communication with Friends and Family: Not on file  . Frequency of Social Gatherings with Friends and Family: Not on file  . Attends Religious Services: Not on file  . Active Member of Clubs or Organizations: Not on file  . Attends Banker Meetings: Not on file  . Marital Status: Not on file  Intimate Partner Violence:   . Fear of Current or Ex-Partner: Not on file  . Emotionally Abused: Not on file  . Physically Abused: Not on file  .  Sexually Abused: Not on file    Outpatient Medications Prior to Visit  Medication Sig Dispense Refill  . albuterol (VENTOLIN HFA) 108 (90 Base) MCG/ACT inhaler Inhale 2 puffs into the lungs every 6 (six) hours as needed for wheezing or shortness of breath. 1 Inhaler 2  . amLODipine (NORVASC) 2.5 MG tablet TAKE 1 TABLET BY MOUTH  DAILY 90 tablet 3  . aspirin 81 MG tablet Take 81 mg by mouth daily. Daily OTC    . cetirizine (ZYRTEC) 10 MG tablet Take 1 tablet (10 mg total) by mouth daily. 30 tablet 11  . famotidine (PEPCID) 20 MG tablet TAKE 1 TABLET BY MOUTH  TWICE DAILY 180 tablet 3  . furosemide (LASIX) 20 MG tablet TAKE 1 TABLET BY MOUTH  DAILY AND A SECOND TABLET  DAILY AS NEEDED FOR PEDAL  EDEMA OR WEIGHT GAIN OF 3  LB IN 24 HOURS 110 tablet 3  . metFORMIN (GLUCOPHAGE-XR) 500 MG 24 hr tablet TAKE 2 TABLETS BY MOUTH IN  THE MORNING AND 1 TABLET IN THE EVENING 270 tablet 3  . potassium chloride SA (KLOR-CON) 20 MEQ tablet TAKE 1 TABLET BY MOUTH  DAILY 90 tablet 3  . rosuvastatin (CRESTOR) 5 MG tablet TAKE 1 TABLET BY MOUTH  DAILY 90 tablet 3  . tiZANidine (ZANAFLEX) 2 MG tablet Take 0.5-1 tablets (1-2 mg total) by mouth at bedtime as needed for muscle spasms. 30 tablet 1  . benzonatate (TESSALON) 100 MG capsule TAKE 1 CAPSULE BY MOUTH TWICE DAILY AS NEEDED FOR COUGH 30 capsule 0  . hydrochlorothiazide (MICROZIDE) 12.5 MG capsule TAKE 1 CAPSULE BY MOUTH  DAILY 90 capsule 3  . montelukast (SINGULAIR) 10 MG tablet Take 1 tablet (10 mg total) by mouth at bedtime as needed (allergies). 30 tablet 3   No facility-administered medications prior to visit.    Allergies  Allergen Reactions  . Iodine Hives  . Shellfish Allergy Rash    Vomiting  Deep sea fish   . Codeine     tinnitus    Review of Systems  Constitutional: Negative for fever and malaise/fatigue.  HENT: Negative for congestion.   Eyes: Negative for blurred vision.  Respiratory: Negative for shortness of breath.    Cardiovascular: Negative for chest pain, palpitations and leg swelling.  Gastrointestinal: Negative for abdominal pain, blood in stool and nausea.  Genitourinary: Negative for dysuria and frequency.  Musculoskeletal: Negative for falls.  Skin: Negative for rash.  Neurological: Negative for dizziness, loss  of consciousness and headaches.  Endo/Heme/Allergies: Negative for environmental allergies.  Psychiatric/Behavioral: Negative for depression. The patient is not nervous/anxious.        Objective:    Physical Exam unable to obtain via phone  BP 134/68 (BP Location: Left Arm, Patient Position: Sitting, Cuff Size: Normal)   Pulse 79   Temp 97.7 F (36.5 C) (Oral)   Ht 5\' 2"  (1.575 m)   Wt 162 lb (73.5 kg)   SpO2 98%   BMI 29.63 kg/m  Wt Readings from Last 3 Encounters:  06/10/19 162 lb (73.5 kg)  09/13/18 161 lb (73 kg)  05/29/18 164 lb 6.4 oz (74.6 kg)    Diabetic Foot Exam - Simple   No data filed     Lab Results  Component Value Date   WBC 5.4 09/25/2018   HGB 12.6 09/25/2018   HCT 39.5 09/25/2018   PLT 248.0 09/25/2018   GLUCOSE 123 (H) 09/25/2018   CHOL 165 09/25/2018   TRIG 113.0 09/25/2018   HDL 68.70 09/25/2018   LDLCALC 74 09/25/2018   ALT 9 09/25/2018   AST 12 09/25/2018   NA 139 09/25/2018   K 4.8 09/25/2018   CL 102 09/25/2018   CREATININE 0.85 09/25/2018   BUN 22 09/25/2018   CO2 31 09/25/2018   TSH 1.70 09/25/2018   HGBA1C 6.2 09/25/2018    Lab Results  Component Value Date   TSH 1.70 09/25/2018   Lab Results  Component Value Date   WBC 5.4 09/25/2018   HGB 12.6 09/25/2018   HCT 39.5 09/25/2018   MCV 91.0 09/25/2018   PLT 248.0 09/25/2018   Lab Results  Component Value Date   NA 139 09/25/2018   K 4.8 09/25/2018   CO2 31 09/25/2018   GLUCOSE 123 (H) 09/25/2018   BUN 22 09/25/2018   CREATININE 0.85 09/25/2018   BILITOT 0.4 09/25/2018   ALKPHOS 85 09/25/2018   AST 12 09/25/2018   ALT 9 09/25/2018   PROT 6.9 09/25/2018    ALBUMIN 4.4 09/25/2018   CALCIUM 9.5 09/25/2018   GFR 77.13 09/25/2018   Lab Results  Component Value Date   CHOL 165 09/25/2018   Lab Results  Component Value Date   HDL 68.70 09/25/2018   Lab Results  Component Value Date   LDLCALC 74 09/25/2018   Lab Results  Component Value Date   TRIG 113.0 09/25/2018   Lab Results  Component Value Date   CHOLHDL 2 09/25/2018   Lab Results  Component Value Date   HGBA1C 6.2 09/25/2018       Assessment & Plan:   Problem List Items Addressed This Visit    Diabetes mellitus without complication (HCC) - Primary    hgba1c acceptable, minimize simple carbs. Increase exercise as tolerated. Continue current meds      Relevant Orders   Hemoglobin A1c   Hyperlipidemia    Encouraged heart healthy diet, increase exercise, avoid trans fats, consider a krill oil cap daily. Tolerating Rosuvastatin      Relevant Medications   hydrochlorothiazide (MICROZIDE) 12.5 MG capsule   Other Relevant Orders   Lipid panel   Hypertension    Monitor and report any concerns.  no changes to meds. Encouraged heart healthy diet such as the DASH diet and exercise as tolerated.       Relevant Medications   hydrochlorothiazide (MICROZIDE) 12.5 MG capsule   Other Relevant Orders   CBC   Comprehensive metabolic panel   TSH  I have changed Mardene Celeste Maybury's hydrochlorothiazide. I am also having her maintain her aspirin, cetirizine, tiZANidine, albuterol, metFORMIN, amLODipine, famotidine, rosuvastatin, potassium chloride SA, furosemide, benzonatate, and montelukast.  Meds ordered this encounter  Medications  . hydrochlorothiazide (MICROZIDE) 12.5 MG capsule    Sig: Take 1 capsule (12.5 mg total) by mouth daily.    Dispense:  90 capsule    Refill:  3    Requesting 1 year supply  . benzonatate (TESSALON) 100 MG capsule    Sig: TAKE 1 CAPSULE BY MOUTH TWICE DAILY AS NEEDED FOR COUGH    Dispense:  30 capsule    Refill:  5  . montelukast  (SINGULAIR) 10 MG tablet    Sig: Take 1 tablet (10 mg total) by mouth at bedtime as needed (allergies).    Dispense:  90 tablet    Refill:  3     Penni Homans, MD    I discussed the assessment and treatment plan with the patient. The patient was provided an opportunity to ask questions and all were answered. The patient agreed with the plan and demonstrated an understanding of the instructions.   The patient was advised to call back or seek an in-person evaluation if the symptoms worsen or if the condition fails to improve as anticipated.  I provided 25 minutes of non-face-to-face time during this encounter.   Penni Homans, MD

## 2019-06-10 NOTE — Assessment & Plan Note (Signed)
Monitor and report any concerns. no changes to meds. Encouraged heart healthy diet such as the DASH diet and exercise as tolerated.  

## 2019-06-10 NOTE — Patient Instructions (Signed)
[facility] Blood Pressure cuff, upper arm, want BP 100-140/60-90 Pulse oximeter, want oxygen in 90s  Weekly vitals  Take Multivitamin with minerals, selenium Vitamin D 1000-2000 IU daily Probiotic with lactobacillus and bifidophilus Asprin EC 81 mg daily  Melatonin 2-5 mg at bedtime  [email or web address] 

## 2019-06-10 NOTE — Assessment & Plan Note (Signed)
hgba1c acceptable, minimize simple carbs. Increase exercise as tolerated. Continue current meds 

## 2019-06-17 ENCOUNTER — Other Ambulatory Visit: Payer: Self-pay | Admitting: Family Medicine

## 2019-06-18 ENCOUNTER — Other Ambulatory Visit (INDEPENDENT_AMBULATORY_CARE_PROVIDER_SITE_OTHER): Payer: Medicare HMO

## 2019-06-18 ENCOUNTER — Other Ambulatory Visit: Payer: Self-pay

## 2019-06-18 DIAGNOSIS — E785 Hyperlipidemia, unspecified: Secondary | ICD-10-CM | POA: Diagnosis not present

## 2019-06-18 DIAGNOSIS — I10 Essential (primary) hypertension: Secondary | ICD-10-CM

## 2019-06-18 DIAGNOSIS — E119 Type 2 diabetes mellitus without complications: Secondary | ICD-10-CM | POA: Diagnosis not present

## 2019-06-18 LAB — COMPREHENSIVE METABOLIC PANEL
ALT: 9 U/L (ref 0–35)
AST: 14 U/L (ref 0–37)
Albumin: 4.4 g/dL (ref 3.5–5.2)
Alkaline Phosphatase: 107 U/L (ref 39–117)
BUN: 24 mg/dL — ABNORMAL HIGH (ref 6–23)
CO2: 30 mEq/L (ref 19–32)
Calcium: 10.1 mg/dL (ref 8.4–10.5)
Chloride: 103 mEq/L (ref 96–112)
Creatinine, Ser: 0.92 mg/dL (ref 0.40–1.20)
GFR: 70.28 mL/min (ref >60.00)
Glucose, Bld: 137 mg/dL — ABNORMAL HIGH (ref 70–99)
Potassium: 4.5 mEq/L (ref 3.5–5.1)
Sodium: 141 mEq/L (ref 135–145)
Total Bilirubin: 0.4 mg/dL (ref 0.2–1.2)
Total Protein: 7.5 g/dL (ref 6.0–8.3)

## 2019-06-18 LAB — CBC
HCT: 39.3 % (ref 36.0–46.0)
Hemoglobin: 12.8 g/dL (ref 12.0–15.0)
MCHC: 32.6 g/dL (ref 30.0–36.0)
MCV: 87.6 fl (ref 78.0–100.0)
Platelets: 230 10*3/uL (ref 150.0–400.0)
RBC: 4.49 Mil/uL (ref 3.87–5.11)
RDW: 13.5 % (ref 11.5–15.5)
WBC: 6.9 10*3/uL (ref 4.0–10.5)

## 2019-06-18 LAB — LIPID PANEL
Cholesterol: 174 mg/dL (ref 0–200)
HDL: 60.5 mg/dL (ref >39.00)
LDL Cholesterol: 88 mg/dL (ref 0–99)
NonHDL: 113.55
Total CHOL/HDL Ratio: 3
Triglycerides: 128 mg/dL (ref 0.0–149.0)
VLDL: 25.6 mg/dL (ref 0.0–40.0)

## 2019-06-18 LAB — TSH: TSH: 1.37 u[IU]/mL (ref 0.35–4.50)

## 2019-06-18 LAB — HEMOGLOBIN A1C: Hgb A1c MFr Bld: 6.7 % — ABNORMAL HIGH (ref 4.6–6.5)

## 2019-06-20 ENCOUNTER — Ambulatory Visit: Payer: Medicare HMO | Admitting: Orthopedic Surgery

## 2019-06-20 ENCOUNTER — Encounter: Payer: Self-pay | Admitting: Orthopedic Surgery

## 2019-06-20 ENCOUNTER — Other Ambulatory Visit: Payer: Self-pay

## 2019-06-20 DIAGNOSIS — M1711 Unilateral primary osteoarthritis, right knee: Secondary | ICD-10-CM

## 2019-06-20 DIAGNOSIS — M1712 Unilateral primary osteoarthritis, left knee: Secondary | ICD-10-CM

## 2019-06-20 NOTE — Progress Notes (Signed)
Office Visit Note   Patient: Abigail Mcpherson           Date of Birth: Apr 08, 1933           MRN: [id number] Visit Date: 06/20/2019 Requested by: [name], MD [address] [place],  [place] [address] PCP: [name], MD  Subjective: Chief Complaint  Patient presents with  . Left Knee - Pain    HPI: Abigail Mcpherson is a 86 year old patient with bilateral knee arthritis left worse than right.  She had left cortisone injection 03/22/2019.  Gel injection 04/11/2019.  These are losing their effectiveness.  She is taking Tylenol 6/day and using Voltaren gel and ice.  She is considering an injection today but wants to hold off on that as well.              ROS: All systems reviewed are negative as they relate to the chief complaint within the history of present illness.  Patient denies  fevers or chills.   Assessment & Plan: Visit Diagnoses:  1. Unilateral primary osteoarthritis, left knee   2. Unilateral primary osteoarthritis, right knee     Plan: Impression is bilateral knee arthritis.  Plan is observation for now with nonloadbearing quad strengthening exercises.  She does not really want to think about knee replacement which is understandable at her age.  She does seem a little bit younger than 65 however.  Regarding further injections I think she is going to let us know if she wants to get any further injections.  She has had gel and cortisone nose from experience how well they work.  Follow-up as needed.  Follow-Up Instructions: Return if symptoms worsen or fail to improve.   Orders:  No orders of the defined types were placed in this encounter.  No orders of the defined types were placed in this encounter.     Procedures: No procedures performed   Clinical Data: No additional findings.  Objective: Vital Signs: There were no vitals taken for this visit.  Physical Exam:  Constitutional: Patient appears well-developed HEENT:  Head: Normocephalic  Eyes:EOM are normal Neck: Normal range of motion Cardiovascular: Normal rate Pulmonary/chest: Effort normal Neurologic: Patient is alert Skin: Skin is warm Psychiatric: Patient has normal mood and affect    Ortho Exam: Knee exam demonstrates about a 50 degree flexion contracture bilaterally with no effusion in either knee.  Flexion is to about 100 degrees bilaterally.  Mild edema bilateral lower extremities.  Extensor mechanism is intact.  Specialty Comments:  No specialty comments available.  Imaging: No results found.   PMFS History: Patient Active Problem List   Diagnosis Date Noted  . Pedal edema 08/21/2018  . Cough 05/29/2018  . Urinary incontinence 05/29/2018  . Knee pain, bilateral 05/29/2018  . Allergy   . History of shingles   . Diabetes mellitus without complication (HCC)   . Hyperlipidemia   . Hypertension   . Cataract    Past Medical History:  Diagnosis Date  . Allergy   . Cataract   . Diabetes mellitus without complication (HCC)   . GERD (gastroesophageal reflux disease)   . History of shingles   . Hyperlipidemia   . Hypertension     Family History  Problem Relation Age of Onset  . Diabetes Mother   . Hypertension Mother   . Hernia Mother        inguinal  . Diabetes Father   . Stroke Son   . Aneurysm Paternal  Aunt        brain  . Cancer Sister        intestinal  . Hypertension Sister   . Diabetes Sister   . Glaucoma Sister   . Diabetes Sister   . Hypertension Sister   . Hyperlipidemia Sister   . Diabetes Sister   . Hypertension Sister   . Hyperlipidemia Sister   . Hernia Sister        umbilical  . Hyperlipidemia Sister   . Hypertension Sister   . Diabetes Sister   . Alcohol abuse Sister   . Diabetes Sister   . Hyperlipidemia Sister   . Hypertension Sister   . Obesity Sister     Past Surgical History:  Procedure Laterality Date  . APPENDECTOMY    . BREAST SURGERY     breast biopsy left , b/l lumpectomy 1969  . BUNIONECTOMY  Bilateral   . CESAREAN SECTION     Social History   Occupational History  . Occupation: Nurse    Comment: retired  Tobacco Use  . Smoking status: Former Research scientist (life sciences)  . Smokeless tobacco: Never Used  Substance and Sexual Activity  . Alcohol use: Yes  . Drug use: Yes  . Sexual activity: Not Currently

## 2019-06-21 ENCOUNTER — Other Ambulatory Visit: Payer: Medicare HMO

## 2019-07-16 MED ORDER — LIDOCAINE HCL 1 % IJ SOLN
5.0000 mL | INTRAMUSCULAR | Status: AC | PRN
  Administered 2019-04-11: 07:00:00 5 mL

## 2019-07-16 MED ORDER — CROSS-LINK HYAL ACID (VISC) 30 MG/3ML IX PRSY
30.0000 mg | PREFILLED_SYRINGE | INTRA_ARTICULAR | Status: AC | PRN
  Administered 2019-04-11: 30 mg via INTRA_ARTICULAR

## 2019-07-29 ENCOUNTER — Encounter: Payer: Self-pay | Admitting: Orthopedic Surgery

## 2019-07-30 ENCOUNTER — Telehealth: Payer: Self-pay

## 2019-07-30 NOTE — Telephone Encounter (Signed)
Tried calling to advise. No answer. LMVM advising per Franky Macho. Asked her to call office to schedule appt for cortisone injection.

## 2019-07-30 NOTE — Telephone Encounter (Signed)
Patient isnt able to get a gel injection in her left knee until June. She would like advise on what she can do until then to relieve her pain. She would like to have a cortisone shot maybe. Please advise

## 2019-07-30 NOTE — Telephone Encounter (Signed)
Please advise. Thanks.  

## 2019-08-15 ENCOUNTER — Ambulatory Visit: Payer: Medicare HMO | Admitting: Orthopedic Surgery

## 2019-08-15 ENCOUNTER — Encounter: Payer: Self-pay | Admitting: Orthopedic Surgery

## 2019-08-15 ENCOUNTER — Other Ambulatory Visit: Payer: Self-pay

## 2019-08-15 DIAGNOSIS — M1712 Unilateral primary osteoarthritis, left knee: Secondary | ICD-10-CM | POA: Diagnosis not present

## 2019-08-15 NOTE — Progress Notes (Signed)
Office Visit Note   Patient: Abigail Mcpherson           Date of Birth: Apr 15, 1933           MRN: [id number] Visit Date: 08/15/2019 Requested by: [name], MD [address] [place],  [place] [address] PCP: [name], MD  Subjective: Chief Complaint  Patient presents with  . Left Knee - Pain    HPI: Abigail Mcpherson is a patient with left knee pain.  She is doing reasonably well with her pain.  Has sporadic pain in that left knee.  Has known history of pretty severe arthritis in the left knee.  Gets injections occasionally alternating cortisone and gel injection.  Having mild symptoms in the left knee at this time.              ROS: All systems reviewed are negative as they relate to the chief complaint within the history of present illness.  Patient denies  fevers or chills.   Assessment & Plan: Visit Diagnoses:  1. Unilateral primary osteoarthritis, left knee     Plan: Impression is left knee pain.  Probable mild exacerbation of existing arthritis.  No effusion in the left knee today.  Plan cortisone injection.  Continue with nonopioid over-the-counter pain medicine for management.  Follow-up with me as needed but we could try gel injection next.This patient is diagnosed with osteoarthritis of the knee(s).    Radiographs show evidence of joint space narrowing, osteophytes, subchondral sclerosis and/or subchondral cysts.  This patient has knee pain which interferes with functional and activities of daily living.    This patient has experienced inadequate response, adverse effects and/or intolerance with conservative treatments such as acetaminophen, NSAIDS, topical creams, physical therapy or regular exercise, knee bracing and/or weight loss.   This patient has experienced inadequate response or has a contraindication to intra articular steroid injections for at least 3 months.   This patient is not scheduled to have a total knee replacement within 6 months of  starting treatment with viscosupplementation.   Follow-Up Instructions: Return if symptoms worsen or fail to improve.   Orders:  No orders of the defined types were placed in this encounter.  No orders of the defined types were placed in this encounter.     Procedures: No procedures performed   Clinical Data: No additional findings.  Objective: Vital Signs: There were no vitals taken for this visit.  Physical Exam:   Constitutional: Patient appears well-developed HEENT:  Head: Normocephalic Eyes:EOM are normal Neck: Normal range of motion Cardiovascular: Normal rate Pulmonary/chest: Effort normal Neurologic: Patient is alert Skin: Skin is warm Psychiatric: Patient has normal mood and affect    Ortho Exam: Ortho exam demonstrates flexion contracture about 15 degrees bilaterally.  Trace effusion right knee no effusion left knee.  Collateral and cruciate ligaments are stable.  No masses lymphadenopathy or skin changes noted in that left knee region.  Varus alignment is present.  Pedal pulses palpable  Specialty Comments:  No specialty comments available.  Imaging: No results found.   PMFS History: Patient Active Problem List   Diagnosis Date Noted  . Pedal edema 08/21/2018  . Cough 05/29/2018  . Urinary incontinence 05/29/2018  . Knee pain, bilateral 05/29/2018  . Allergy   . History of shingles   . Diabetes mellitus without complication (HCC)   . Hyperlipidemia   . Hypertension   . Cataract    Past Medical History:  Diagnosis Date  .  Allergy   . Cataract   . Diabetes mellitus without complication ([place])   . GERD (gastroesophageal reflux disease)   . History of shingles   . Hyperlipidemia   . Hypertension     Family History  Problem Relation Age of Onset  . Diabetes Mother   . Hypertension Mother   . Hernia Mother        inguinal  . Diabetes Father   . Stroke Son   . Aneurysm Paternal Aunt        brain  . Cancer Sister        intestinal  .  Hypertension Sister   . Diabetes Sister   . Glaucoma Sister   . Diabetes Sister   . Hypertension Sister   . Hyperlipidemia Sister   . Diabetes Sister   . Hypertension Sister   . Hyperlipidemia Sister   . Hernia Sister        umbilical  . Hyperlipidemia Sister   . Hypertension Sister   . Diabetes Sister   . Alcohol abuse Sister   . Diabetes Sister   . Hyperlipidemia Sister   . Hypertension Sister   . Obesity Sister     Past Surgical History:  Procedure Laterality Date  . APPENDECTOMY    . BREAST SURGERY     breast biopsy left , b/l lumpectomy 1969  . BUNIONECTOMY Bilateral   . CESAREAN SECTION     Social History   Occupational History  . Occupation: Nurse    Comment: retired  Tobacco Use  . Smoking status: Former Research scientist (life sciences)  . Smokeless tobacco: Never Used  Substance and Sexual Activity  . Alcohol use: Yes  . Drug use: Yes  . Sexual activity: Not Currently

## 2019-09-12 ENCOUNTER — Telehealth: Payer: Self-pay

## 2019-09-12 NOTE — Telephone Encounter (Signed)
Submitted VOB for Gel-One, left knee. 

## 2019-09-17 ENCOUNTER — Telehealth: Payer: Self-pay

## 2019-09-17 NOTE — Telephone Encounter (Signed)
Approved, Gel-One, left knee. Buy & Bill Covered through her insurance. Co-pay of $10.00 required PA required [place] Approval# [id number] Valid 10/11/2019- 01/11/2020  Appt. 10/16/2019

## 2019-10-03 ENCOUNTER — Other Ambulatory Visit: Payer: Self-pay | Admitting: Family Medicine

## 2019-10-16 ENCOUNTER — Ambulatory Visit: Payer: Medicare HMO | Admitting: Orthopedic Surgery

## 2019-10-16 DIAGNOSIS — M1712 Unilateral primary osteoarthritis, left knee: Secondary | ICD-10-CM

## 2019-10-19 ENCOUNTER — Encounter: Payer: Self-pay | Admitting: Orthopedic Surgery

## 2019-10-19 DIAGNOSIS — M1712 Unilateral primary osteoarthritis, left knee: Secondary | ICD-10-CM | POA: Diagnosis not present

## 2019-10-19 MED ORDER — CROSS-LINK HYAL ACID (VISC) 30 MG/3ML IX PRSY
30.0000 mg | PREFILLED_SYRINGE | INTRA_ARTICULAR | Status: AC | PRN
  Administered 2019-10-19: 30 mg via INTRA_ARTICULAR

## 2019-10-19 MED ORDER — LIDOCAINE HCL 1 % IJ SOLN
5.0000 mL | INTRAMUSCULAR | Status: AC | PRN
  Administered 2019-10-19: 5 mL

## 2019-10-19 NOTE — Progress Notes (Signed)
° °  Procedure Note  Patient: Abigail Mcpherson             Date of Birth: 18-Mar-1933           MRN: [id number]             Visit Date: 10/16/2019  Procedures: Visit Diagnoses:  1. Unilateral primary osteoarthritis, left knee     Large Joint Inj: L knee on 10/19/2019 3:01 PM Indications: diagnostic evaluation, joint swelling and pain Details: 18 G 1.5 in needle, superolateral approach  Arthrogram: No  Medications: 5 mL lidocaine 1 %; 30 mg Cross-Linked Hyaluronate 30 MG/3ML Outcome: tolerated well, no immediate complications Procedure, treatment alternatives, risks and benefits explained, specific risks discussed. Consent was given by the patient. Immediately prior to procedure a time out was called to verify the correct patient, procedure, equipment, support staff and site/side marked as required. Patient was prepped and draped in the usual sterile fashion.

## 2019-11-26 LAB — HM DIABETES EYE EXAM

## 2019-12-09 ENCOUNTER — Telehealth (INDEPENDENT_AMBULATORY_CARE_PROVIDER_SITE_OTHER): Payer: Medicare HMO | Admitting: Family Medicine

## 2019-12-09 ENCOUNTER — Other Ambulatory Visit: Payer: Self-pay

## 2019-12-09 DIAGNOSIS — K2100 Gastro-esophageal reflux disease with esophagitis, without bleeding: Secondary | ICD-10-CM | POA: Diagnosis not present

## 2019-12-09 DIAGNOSIS — E785 Hyperlipidemia, unspecified: Secondary | ICD-10-CM | POA: Diagnosis not present

## 2019-12-09 DIAGNOSIS — E119 Type 2 diabetes mellitus without complications: Secondary | ICD-10-CM | POA: Diagnosis not present

## 2019-12-09 DIAGNOSIS — R059 Cough, unspecified: Secondary | ICD-10-CM

## 2019-12-09 DIAGNOSIS — R05 Cough: Secondary | ICD-10-CM

## 2019-12-09 DIAGNOSIS — H409 Unspecified glaucoma: Secondary | ICD-10-CM | POA: Insufficient documentation

## 2019-12-09 DIAGNOSIS — I10 Essential (primary) hypertension: Secondary | ICD-10-CM

## 2019-12-09 MED ORDER — FAMOTIDINE 40 MG PO TABS: 40.0000 mg | ORAL_TABLET | Freq: Every day | ORAL | 3 refills | Status: DC

## 2019-12-09 MED ORDER — OMEPRAZOLE 40 MG PO CPDR
40.0000 mg | DELAYED_RELEASE_CAPSULE | Freq: Every day | ORAL | 3 refills | Status: DC
Start: 2019-12-09 — End: 2020-03-17

## 2019-12-09 NOTE — Progress Notes (Signed)
Virtual Visit via Phone Note  I connected with Abigail Mcpherson on 12/09/19 at  9:40 AM EDT by a phone enabled telemedicine application and verified that I am speaking with the correct person using two identifiers.  Location: Patient: home with daughter Provider: office, patient, daughter and provider in visit   I discussed the limitations of evaluation and management by telemedicine and the availability of in person appointments. The patient expressed understanding and agreed to proceed. CMA able to get the patient set up on a phone visit after being unable to set up a video visit   Subjective:    Patient ID: Abigail Mcpherson, female    DOB: December 24, 1932, 86 y.o.   MRN: [id number]  No chief complaint on file.   HPI Patient is in today for follow up on chronic medical concerns. No recent febrile illness or hospitalizations. She has been diagnosed with glaucoma and is scheduled to see specialist next month. Her cough and heartburn are worse again. Even notes fluid in her throat. Had some mild constipation but that has resolved. She has throat irritation and some pressure in her right ear. Denies CP/palp/SOB/HA/fevers or GU c/o. Taking meds as prescribed  Past Medical History:  Diagnosis Date  . Allergy   . Cataract   . Diabetes mellitus without complication (HCC)   . GERD (gastroesophageal reflux disease)   . History of shingles   . Hyperlipidemia   . Hypertension     Past Surgical History:  Procedure Laterality Date  . APPENDECTOMY    . BREAST SURGERY     breast biopsy left , b/l lumpectomy 1969  . BUNIONECTOMY Bilateral   . CESAREAN SECTION      Family History  Problem Relation Age of Onset  . Diabetes Mother   . Hypertension Mother   . Hernia Mother        inguinal  . Diabetes Father   . Stroke Son   . Aneurysm Paternal Aunt        brain  . Cancer Sister        intestinal  . Hypertension Sister   . Diabetes Sister   . Glaucoma Sister   . Diabetes Sister   .  Hypertension Sister   . Hyperlipidemia Sister   . Diabetes Sister   . Hypertension Sister   . Hyperlipidemia Sister   . Hernia Sister        umbilical  . Hyperlipidemia Sister   . Hypertension Sister   . Diabetes Sister   . Alcohol abuse Sister   . Diabetes Sister   . Hyperlipidemia Sister   . Hypertension Sister   . Obesity Sister     Social History   Socioeconomic History  . Marital status: Divorced    Spouse name: Not on file  . Number of children: Not on file  . Years of education: Not on file  . Highest education level: Not on file  Occupational History  . Occupation: Nurse    Comment: retired  Tobacco Use  . Smoking status: Former Games developer  . Smokeless tobacco: Never Used  Substance and Sexual Activity  . Alcohol use: Yes  . Drug use: Yes  . Sexual activity: Not Currently  Other Topics Concern  . Not on file  Social History Narrative   Avoids pork and shellfish and deep sea fish. Lives with family, daughter, wears seat belt always. Divorced, retired Engineer, civil (consulting)   Social Determinants of Corporate investment banker Strain:   . Difficulty of Paying Living  Expenses: Not on file  Food Insecurity:   . Worried About Programme researcher, broadcasting/film/videounning Out of Food in the Last Year: Not on file  . Ran Out of Food in the Last Year: Not on file  Transportation Needs:   . Lack of Transportation (Medical): Not on file  . Lack of Transportation (Non-Medical): Not on file  Physical Activity:   . Days of Exercise per Week: Not on file  . Minutes of Exercise per Session: Not on file  Stress:   . Feeling of Stress : Not on file  Social Connections:   . Frequency of Communication with Friends and Family: Not on file  . Frequency of Social Gatherings with Friends and Family: Not on file  . Attends Religious Services: Not on file  . Active Member of Clubs or Organizations: Not on file  . Attends BankerClub or Organization Meetings: Not on file  . Marital Status: Not on file  Intimate Partner Violence:   . Fear  of Current or Ex-Partner: Not on file  . Emotionally Abused: Not on file  . Physically Abused: Not on file  . Sexually Abused: Not on file    Outpatient Medications Prior to Visit  Medication Sig Dispense Refill  . albuterol (VENTOLIN HFA) 108 (90 Base) MCG/ACT inhaler USE 2 INHALATIONS BY MOUTH  EVERY 6 HOURS AS NEEDED FOR WHEEZING OR SHORTNESS OF  BREATH 54 g 1  . amLODipine (NORVASC) 2.5 MG tablet TAKE 1 TABLET BY MOUTH  DAILY 90 tablet 3  . aspirin 81 MG tablet Take 81 mg by mouth daily. Daily OTC    . benzonatate (TESSALON) 100 MG capsule TAKE 1 CAPSULE BY MOUTH TWICE DAILY AS NEEDED FOR COUGH 30 capsule 5  . cetirizine (ZYRTEC) 10 MG tablet Take 1 tablet (10 mg total) by mouth daily. 30 tablet 11  . furosemide (LASIX) 20 MG tablet TAKE 1 TABLET BY MOUTH  DAILY AND A SECOND TABLET  DAILY AS NEEDED FOR PEDAL  EDEMA OR WEIGHT GAIN OF 3  LB IN 24 HOURS 110 tablet 3  . hydrochlorothiazide (MICROZIDE) 12.5 MG capsule Take 1 capsule (12.5 mg total) by mouth daily. 90 capsule 3  . metFORMIN (GLUCOPHAGE-XR) 500 MG 24 hr tablet TAKE 2 TABLETS BY MOUTH IN  THE MORNING AND 1 TABLET IN THE EVENING 270 tablet 3  . montelukast (SINGULAIR) 10 MG tablet Take 1 tablet (10 mg total) by mouth at bedtime as needed (allergies). 90 tablet 3  . potassium chloride SA (KLOR-CON) 20 MEQ tablet TAKE 1 TABLET BY MOUTH  DAILY 90 tablet 3  . rosuvastatin (CRESTOR) 5 MG tablet TAKE 1 TABLET BY MOUTH  DAILY 90 tablet 3  . tiZANidine (ZANAFLEX) 2 MG tablet Take 0.5-1 tablets (1-2 mg total) by mouth at bedtime as needed for muscle spasms. 30 tablet 1  . famotidine (PEPCID) 20 MG tablet TAKE 1 TABLET BY MOUTH  TWICE DAILY 180 tablet 3   No facility-administered medications prior to visit.    Allergies  Allergen Reactions  . Iodine Hives  . Shellfish Allergy Rash    Vomiting  Deep sea fish   . Codeine     tinnitus    Review of Systems  Constitutional: Negative for chills, fever and malaise/fatigue.  HENT:  Positive for congestion. Negative for ear discharge, ear pain and sinus pain.   Eyes: Negative for blurred vision.  Respiratory: Positive for cough and sputum production. Negative for shortness of breath and stridor.   Cardiovascular: Negative for chest pain, palpitations  and leg swelling.  Gastrointestinal: Positive for heartburn. Negative for abdominal pain, blood in stool and nausea.  Genitourinary: Negative for dysuria and frequency.  Musculoskeletal: Negative for falls.  Skin: Negative for rash.  Neurological: Negative for dizziness, loss of consciousness and headaches.  Endo/Heme/Allergies: Negative for environmental allergies.  Psychiatric/Behavioral: Negative for depression. The patient is not nervous/anxious.        Objective:    Physical Exam unable to obtain via phone visit  There were no vitals taken for this visit. Wt Readings from Last 3 Encounters:  06/10/19 162 lb (73.5 kg)  09/13/18 161 lb (73 kg)  05/29/18 164 lb 6.4 oz (74.6 kg)    Diabetic Foot Exam - Simple   No data filed     Lab Results  Component Value Date   WBC 6.9 06/18/2019   HGB 12.8 06/18/2019   HCT 39.3 06/18/2019   PLT 230.0 06/18/2019   GLUCOSE 137 (H) 06/18/2019   CHOL 174 06/18/2019   TRIG 128.0 06/18/2019   HDL 60.50 06/18/2019   LDLCALC 88 06/18/2019   ALT 9 06/18/2019   AST 14 06/18/2019   NA 141 06/18/2019   K 4.5 06/18/2019   CL 103 06/18/2019   CREATININE 0.92 06/18/2019   BUN 24 (H) 06/18/2019   CO2 30 06/18/2019   TSH 1.37 06/18/2019   HGBA1C 6.7 (H) 06/18/2019    Lab Results  Component Value Date   TSH 1.37 06/18/2019   Lab Results  Component Value Date   WBC 6.9 06/18/2019   HGB 12.8 06/18/2019   HCT 39.3 06/18/2019   MCV 87.6 06/18/2019   PLT 230.0 06/18/2019   Lab Results  Component Value Date   NA 141 06/18/2019   K 4.5 06/18/2019   CO2 30 06/18/2019   GLUCOSE 137 (H) 06/18/2019   BUN 24 (H) 06/18/2019   CREATININE 0.92 06/18/2019   BILITOT 0.4  06/18/2019   ALKPHOS 107 06/18/2019   AST 14 06/18/2019   ALT 9 06/18/2019   PROT 7.5 06/18/2019   ALBUMIN 4.4 06/18/2019   CALCIUM 10.1 06/18/2019   GFR 70.28 06/18/2019   Lab Results  Component Value Date   CHOL 174 06/18/2019   Lab Results  Component Value Date   HDL 60.50 06/18/2019   Lab Results  Component Value Date   LDLCALC 88 06/18/2019   Lab Results  Component Value Date   TRIG 128.0 06/18/2019   Lab Results  Component Value Date   CHOLHDL 3 06/18/2019   Lab Results  Component Value Date   HGBA1C 6.7 (H) 06/18/2019       Assessment & Plan:   Problem List Items Addressed This Visit    Cough    Worsening with worsening reflux unable to lie flat without coughing and dyspepsia. Increase acid suppression and refer to gastroenterology      Diabetes mellitus without complication (HCC)    Sugars running 107 to 179. Will recheck hgba1c. hgba1c acceptable, minimize simple carbs. Increase exercise as tolerated. Continue current meds      Relevant Orders   Hemoglobin A1c   Hyperlipidemia   Relevant Orders   Lipid panel   Hypertension    Well controlled, no changes to meds. Encouraged heart healthy diet such as the DASH diet and exercise as tolerated. Well controlled home 110 to 120/70 typically      Relevant Orders   CBC   Comprehensive metabolic panel   TSH   Glaucoma    She has been diagnosed  by her eye doctor Dr Joseph Art, she has been referred to opthamology Dr Jacqulyn Bath in September for further evaluation      Reflux esophagitis - Primary    Worsening over past month, has burning and coughing which worsens when she lies flat. Change Famotidine to 40 mg qhs and add Omeprazole 40 mg in am, can use Tum prn and referred to gastroenterology for evaluation. Avoid offending foods, start probiotics. Do not eat large meals in late evening and consider raising head of bed.       Relevant Orders   Ambulatory referral to Gastroenterology      I have discontinued  Mikala Cast's famotidine. I am also having her start on famotidine and omeprazole. Additionally, I am having her maintain her aspirin, cetirizine, tiZANidine, metFORMIN, amLODipine, rosuvastatin, potassium chloride SA, furosemide, hydrochlorothiazide, montelukast, albuterol, and benzonatate.  Meds ordered this encounter  Medications  . famotidine (PEPCID) 40 MG tablet    Sig: Take 1 tablet (40 mg total) by mouth at bedtime.    Dispense:  30 tablet    Refill:  3  . omeprazole (PRILOSEC) 40 MG capsule    Sig: Take 1 capsule (40 mg total) by mouth daily.    Dispense:  30 capsule    Refill:  3   I discussed the assessment and treatment plan with the patient. The patient was provided an opportunity to ask questions and all were answered. The patient agreed with the plan and demonstrated an understanding of the instructions.   The patient was advised to call back or seek an in-person evaluation if the symptoms worsen or if the condition fails to improve as anticipated.  I provided 25 minutes of non-face-to-face time during this encounter.  Danise Edge, MD

## 2019-12-09 NOTE — Assessment & Plan Note (Signed)
She has been diagnosed by her eye doctor Dr Joseph Art, she has been referred to opthamology Dr Jacqulyn Bath in September for further evaluation

## 2019-12-09 NOTE — Assessment & Plan Note (Signed)
Well controlled, no changes to meds. Encouraged heart healthy diet such as the DASH diet and exercise as tolerated. Well controlled home 110 to 120/70 typically

## 2019-12-09 NOTE — Assessment & Plan Note (Signed)
Worsening with worsening reflux unable to lie flat without coughing and dyspepsia. Increase acid suppression and refer to gastroenterology

## 2019-12-09 NOTE — Assessment & Plan Note (Signed)
Sugars running 107 to 179. Will recheck hgba1c. hgba1c acceptable, minimize simple carbs. Increase exercise as tolerated. Continue current meds

## 2019-12-09 NOTE — Assessment & Plan Note (Signed)
Worsening over past month, has burning and coughing which worsens when she lies flat. Change Famotidine to 40 mg qhs and add Omeprazole 40 mg in am, can use Tum prn and referred to gastroenterology for evaluation. Avoid offending foods, start probiotics. Do not eat large meals in late evening and consider raising head of bed.

## 2019-12-10 ENCOUNTER — Encounter: Payer: Self-pay | Admitting: *Deleted

## 2019-12-11 ENCOUNTER — Telehealth: Payer: Self-pay | Admitting: Family Medicine

## 2019-12-11 NOTE — Telephone Encounter (Signed)
Left voicemail pt needs 4 month follow up.

## 2019-12-14 ENCOUNTER — Other Ambulatory Visit: Payer: Self-pay | Admitting: Family Medicine

## 2019-12-31 ENCOUNTER — Other Ambulatory Visit: Payer: Self-pay | Admitting: Family Medicine

## 2019-12-31 ENCOUNTER — Other Ambulatory Visit: Payer: Self-pay

## 2019-12-31 MED ORDER — FAMOTIDINE 40 MG PO TABS: 40.0000 mg | ORAL_TABLET | Freq: Every day | ORAL | 3 refills | Status: DC

## 2020-01-17 ENCOUNTER — Ambulatory Visit (INDEPENDENT_AMBULATORY_CARE_PROVIDER_SITE_OTHER): Payer: Medicare HMO | Admitting: Orthopedic Surgery

## 2020-01-17 DIAGNOSIS — M1711 Unilateral primary osteoarthritis, right knee: Secondary | ICD-10-CM

## 2020-01-17 DIAGNOSIS — M1712 Unilateral primary osteoarthritis, left knee: Secondary | ICD-10-CM

## 2020-01-19 ENCOUNTER — Encounter: Payer: Self-pay | Admitting: Orthopedic Surgery

## 2020-01-19 DIAGNOSIS — M1712 Unilateral primary osteoarthritis, left knee: Secondary | ICD-10-CM | POA: Diagnosis not present

## 2020-01-19 DIAGNOSIS — M1711 Unilateral primary osteoarthritis, right knee: Secondary | ICD-10-CM | POA: Diagnosis not present

## 2020-01-19 MED ORDER — METHYLPREDNISOLONE ACETATE 40 MG/ML IJ SUSP
40.0000 mg | INTRAMUSCULAR | Status: AC | PRN
  Administered 2020-01-19: 40 mg via INTRA_ARTICULAR

## 2020-01-19 MED ORDER — LIDOCAINE HCL 1 % IJ SOLN
5.0000 mL | INTRAMUSCULAR | Status: AC | PRN
  Administered 2020-01-19: 5 mL

## 2020-01-19 MED ORDER — BUPIVACAINE HCL 0.25 % IJ SOLN
4.0000 mL | INTRAMUSCULAR | Status: AC | PRN
  Administered 2020-01-19: 4 mL via INTRA_ARTICULAR

## 2020-01-19 NOTE — Progress Notes (Signed)
Office Visit Note   Patient: Abigail Mcpherson           Date of Birth: Aug 18, 1932           MRN: [id number] Visit Date: 01/17/2020 Requested by: [name], MD [address] [place],  [place] [address] PCP: [name], MD  Subjective: Chief Complaint  Patient presents with   Left Knee - Pain   Right Knee - Pain    HPI: Abigail Mcpherson is an 86 year old patient with bilateral knee pain.  Had hyaluronic injection 10/19/2019.  Has an iodine allergy.  Left knee injection was done which helps.  She states that the brace helps her right knee.  She would like to have the right knee injected today.              ROS: All systems reviewed are negative as they relate to the chief complaint within the history of present illness.  Patient denies  fevers or chills.   Assessment & Plan: Visit Diagnoses:  1. Unilateral primary osteoarthritis, left knee   2. Unilateral primary osteoarthritis, right knee     Plan: Impression is right knee pain with symptomatic arthritis.  Plan is cortisone injection performed today.  Continue with nonweightbearing short quadrant for knee exercises.  Want to do not more than 3 cortisone injections per year.  Hyaluronic acid could also be considered.  Follow-up as needed.  Follow-Up Instructions: Return if symptoms worsen or fail to improve.   Orders:  No orders of the defined types were placed in this encounter.  No orders of the defined types were placed in this encounter.     Procedures: Large Joint Inj: R knee on 01/19/2020 6:05 PM Indications: diagnostic evaluation, joint swelling and pain Details: 18 G 1.5 in needle, superolateral approach  Arthrogram: No  Medications: 5 mL lidocaine 1 %; 40 mg methylPREDNISolone acetate 40 MG/ML; 4 mL bupivacaine 0.25 % Outcome: tolerated well, no immediate complications Procedure, treatment alternatives, risks and benefits explained, specific risks discussed. Consent was given by the patient.  Immediately prior to procedure a time out was called to verify the correct patient, procedure, equipment, support staff and site/side marked as required. Patient was prepped and draped in the usual sterile fashion.       Clinical Data: No additional findings.  Objective: Vital Signs: There were no vitals taken for this visit.  Physical Exam:   Constitutional: Patient appears well-developed HEENT:  Head: Normocephalic Eyes:EOM are normal Neck: Normal range of motion Cardiovascular: Normal rate Pulmonary/chest: Effort normal Neurologic: Patient is alert Skin: Skin is warm Psychiatric: Patient has normal mood and affect    Ortho Exam: Ortho exam demonstrates full active and passive range of motion of the right and left knee.  Slight flexion contracture bilaterally.  No effusion in either knee.  Collateral and cruciate ligaments are stable.  No masses lymphadenopathy or skin changes noted in the bilateral knee region.  Specialty Comments:  No specialty comments available.  Imaging: No results found.   PMFS History: Patient Active Problem List   Diagnosis Date Noted   Glaucoma 12/09/2019   Reflux esophagitis 12/09/2019   Pedal edema 08/21/2018   Cough 05/29/2018   Urinary incontinence 05/29/2018   Knee pain, bilateral 05/29/2018   Allergy    History of shingles    Diabetes mellitus without complication (HCC)    Hyperlipidemia    Hypertension    Cataract    Past Medical History:  Diagnosis Date  Allergy    Cataract    Diabetes mellitus without complication (HCC)    GERD (gastroesophageal reflux disease)    History of shingles    Hyperlipidemia    Hypertension     Family History  Problem Relation Age of Onset   Diabetes Mother    Hypertension Mother    Hernia Mother        inguinal   Diabetes Father    Stroke Son    Aneurysm Paternal Aunt        brain   Cancer Sister        intestinal   Hypertension Sister    Diabetes  Sister    Glaucoma Sister    Diabetes Sister    Hypertension Sister    Hyperlipidemia Sister    Diabetes Sister    Hypertension Sister    Hyperlipidemia Sister    Hernia Sister        umbilical   Hyperlipidemia Sister    Hypertension Sister    Diabetes Sister    Alcohol abuse Sister    Diabetes Sister    Hyperlipidemia Sister    Hypertension Sister    Obesity Sister     Past Surgical History:  Procedure Laterality Date   APPENDECTOMY     BREAST SURGERY     breast biopsy left , b/l lumpectomy 1969   BUNIONECTOMY Bilateral    CESAREAN SECTION     Social History   Occupational History   Occupation: Engineer, civil (consulting)    Comment: retired  Tobacco Use   Smoking status: Former Smoker   Smokeless tobacco: Never Used  Substance and Sexual Activity   Alcohol use: Yes   Drug use: Yes   Sexual activity: Not Currently

## 2020-01-20 ENCOUNTER — Encounter: Payer: Self-pay | Admitting: Nurse Practitioner

## 2020-01-20 ENCOUNTER — Ambulatory Visit (INDEPENDENT_AMBULATORY_CARE_PROVIDER_SITE_OTHER): Payer: Medicare HMO | Admitting: Nurse Practitioner

## 2020-01-20 VITALS — BP 124/74 | HR 62 | Ht 59.84 in | Wt 167.0 lb

## 2020-01-20 DIAGNOSIS — K219 Gastro-esophageal reflux disease without esophagitis: Secondary | ICD-10-CM

## 2020-01-20 DIAGNOSIS — R198 Other specified symptoms and signs involving the digestive system and abdomen: Secondary | ICD-10-CM

## 2020-01-20 DIAGNOSIS — R0989 Other specified symptoms and signs involving the circulatory and respiratory systems: Secondary | ICD-10-CM

## 2020-01-20 DIAGNOSIS — R059 Cough, unspecified: Secondary | ICD-10-CM | POA: Diagnosis not present

## 2020-01-20 NOTE — Patient Instructions (Signed)
If you are age 84 or older, your body mass index should be between 23-30. Your Body mass index is 32.79 kg/m. If this is out of the aforementioned range listed, please consider follow up with your Primary Care Provider.  If you are age 62 or younger, your body mass index should be between 19-25. Your Body mass index is 32.79 kg/m. If this is out of the aformentioned range listed, please consider follow up with your Primary Care Provider.   You have been scheduled for an endoscopy. Please follow written instructions given to you at your visit today. If you use inhalers (even only as needed), please bring them with you on the day of your procedure.  Due to recent changes in healthcare laws, you may see the results of your imaging and laboratory studies on MyChart before your provider has had a chance to review them.  We understand that in some cases there may be results that are confusing or concerning to you. Not all laboratory results come back in the same time frame and the provider may be waiting for multiple results in order to interpret others.  Please give [place] 48 hours in order for your provider to thoroughly review all the results before contacting the office for clarification of your results.

## 2020-01-20 NOTE — Progress Notes (Addendum)
ASSESSMENT AND PLAN    # GERD with globus. Her chronic cough may be secondary to GERD --Has already seen ENT --Uses two inhalers, zyrtec and tessalon  --Anti-reflex meausre discussed, she appears to be complaint with them --Symptoms persist anti-reflux measures, Pepcid + Prilosec --She does complain of ongoing post-nasal drip which may be contributing to cough --We discussed EGD for evaluation of persistent GERD symptoms. We also discussed the pros/cons of barium swallow. After a long discussion with patient and daughter patient would like to proceed with EGD. The risks and benefits of EGD were discussed and the patient agrees to proceed.    HISTORY OF PRESENT ILLNESS     Primary Gastroenterologist : new - Marsa Aris, MD  Chief Complaint : GERD  Abigail Mcpherson is a 86 y.o. female with PMH / PSH significant for,  but not necessarily limited to: HTN, DM2, hyperlipidemia  Patient says she was diagnosed with GERD around 2006. At that time she had gas and chest discomfort after eating acidic foods. Tums didn't help much. She also has a chronic cough which is so bad that it has kept her from going to church. The cough sounds " wet" but is non-productive.Marland Kitchen  PCP started her on Pepcid around Feb 2020. She was subsequently evaluated by ENT, sounds like she was treated for allergies and post-nasal drip but the cough persisted.  The discomfort in her chest has got worse, PCP added Prilosec about 6 weeks ago  Patient consumes a small amount of baking soda everyday and thinks that has helped more than anything. She hasn't been having any chest discomfort recently but she still still has the cough and  belches a lot. Over the last few months she has developed a sensation that something is in her throat. She adamantly denies dysphagia to food but pills feel like they get stuck. No nausea / vomiting. No abdominal pain. Bowels moving okay.   Data Reviewed: 06/18/19 LFTs normal BUN 24,  Cr   0.92 CBC normal.    Past Medical History:  Diagnosis Date  . Allergy   . Cataract   . Diabetes mellitus without complication (HCC)   . GERD (gastroesophageal reflux disease)   . History of shingles   . Hyperlipidemia   . Hypertension     Past Surgical History:  Procedure Laterality Date  . APPENDECTOMY    . BREAST SURGERY     breast biopsy left , b/l lumpectomy 1969  . BUNIONECTOMY Bilateral   . CESAREAN SECTION     Family History  Problem Relation Age of Onset  . Diabetes Mother   . Hypertension Mother   . Hernia Mother        inguinal  . Diabetes Father   . Stroke Son   . Aneurysm Paternal Aunt        brain  . Cancer Sister        intestinal  . Hypertension Sister   . Diabetes Sister   . Glaucoma Sister   . Diabetes Sister   . Hypertension Sister   . Hyperlipidemia Sister   . Diabetes Sister   . Hypertension Sister   . Hyperlipidemia Sister   . Hernia Sister        umbilical  . Hyperlipidemia Sister   . Hypertension Sister   . Diabetes Sister   . Alcohol abuse Sister   . Diabetes Sister   . Hyperlipidemia Sister   . Hypertension Sister   . Obesity Sister   .  Stomach cancer Neg Hx   . Colon cancer Neg Hx   . Esophageal cancer Neg Hx    Social History   Tobacco Use  . Smoking status: Former Games developer  . Smokeless tobacco: Never Used  Vaping Use  . Vaping Use: Never used  Substance Use Topics  . Alcohol use: Yes    Comment: occ  . Drug use: Not Currently   Current Outpatient Medications  Medication Sig Dispense Refill  . albuterol (VENTOLIN HFA) 108 (90 Base) MCG/ACT inhaler USE 2 INHALATIONS BY MOUTH  EVERY 6 HOURS AS NEEDED FOR WHEEZING OR SHORTNESS OF  BREATH 54 g 1  . amLODipine (NORVASC) 2.5 MG tablet TAKE 1 TABLET BY MOUTH  DAILY 90 tablet 3  . aspirin 81 MG tablet Take 81 mg by mouth daily. Daily OTC    . benzonatate (TESSALON) 100 MG capsule TAKE 1 CAPSULE BY MOUTH TWICE DAILY AS NEEDED FOR COUGH 30 capsule 5  . cetirizine (ZYRTEC) 10 MG  tablet Take 1 tablet (10 mg total) by mouth daily. 30 tablet 11  . famotidine (PEPCID) 40 MG tablet Take 1 tablet (40 mg total) by mouth at bedtime. 90 tablet 3  . furosemide (LASIX) 20 MG tablet TAKE 1 TABLET BY MOUTH  DAILY AND A SECOND TABLET  DAILY AS NEEDED FOR PEDAL  EDEMA OR WEIGHT GAIN OF 3  LB IN 24 HOURS 110 tablet 3  . hydrochlorothiazide (MICROZIDE) 12.5 MG capsule Take 1 capsule (12.5 mg total) by mouth daily. 90 capsule 3  . metFORMIN (GLUCOPHAGE-XR) 500 MG 24 hr tablet TAKE 2 TABLETS BY MOUTH IN  THE MORNING AND 1 TABLET IN THE EVENING 270 tablet 3  . montelukast (SINGULAIR) 10 MG tablet TAKE 1 TABLET(10 MG) BY MOUTH AT BEDTIME AS NEEDED FOR ALLERGIES 30 tablet 2  . omeprazole (PRILOSEC) 40 MG capsule Take 1 capsule (40 mg total) by mouth daily. 30 capsule 3  . potassium chloride SA (KLOR-CON) 20 MEQ tablet TAKE 1 TABLET BY MOUTH  DAILY 90 tablet 3  . rosuvastatin (CRESTOR) 5 MG tablet TAKE 1 TABLET BY MOUTH  DAILY 90 tablet 3  . tiZANidine (ZANAFLEX) 2 MG tablet Take 0.5-1 tablets (1-2 mg total) by mouth at bedtime as needed for muscle spasms. 30 tablet 1   No current facility-administered medications for this visit.   Allergies  Allergen Reactions  . Iodine Hives  . Shellfish Allergy Rash    Vomiting  Deep sea fish   . Codeine     tinnitus     Review of Systems: Positive for arthritis, cough, fatigue, heart murmur, sleeping problems.   All other systems reviewed and negative except where noted in HPI.   PHYSICAL EXAM :    Wt Readings from Last 3 Encounters:  01/20/20 167 lb (75.8 kg)  06/10/19 162 lb (73.5 kg)  09/13/18 161 lb (73 kg)    BP 124/74   Pulse 62   Ht 4' 11.84" (1.52 m)   Wt 167 lb (75.8 kg)   BMI 32.79 kg/m  Constitutional:  Pleasant female in no acute distress. Psychiatric: Normal mood and affect. Behavior is normal. EENT: Pupils normal.  Conjunctivae are normal. No scleral icterus. Neck supple.  Cardiovascular: Normal rate, regular rhythm.  No edema Pulmonary/chest: Effort normal and breath sounds normal. No wheezing, rales or rhonchi. Abdominal: Soft, nondistended, nontender. Bowel sounds active throughout. There are no masses palpable. No hepatomegaly. Neurological: Alert and oriented to person place and time. Skin: Skin is warm and dry.  No rashes noted.  Willette Cluster, NP  01/20/2020, 11:27 AM  Cc:  Referring Provider [name], MD

## 2020-01-27 NOTE — Progress Notes (Signed)
Reviewed and agree with documentation and assessment and plan. K. Veena Khristen Cheyney , MD   

## 2020-01-28 ENCOUNTER — Other Ambulatory Visit (INDEPENDENT_AMBULATORY_CARE_PROVIDER_SITE_OTHER): Payer: Medicare HMO

## 2020-01-28 DIAGNOSIS — I10 Essential (primary) hypertension: Secondary | ICD-10-CM | POA: Diagnosis not present

## 2020-01-28 DIAGNOSIS — E119 Type 2 diabetes mellitus without complications: Secondary | ICD-10-CM

## 2020-01-28 DIAGNOSIS — E785 Hyperlipidemia, unspecified: Secondary | ICD-10-CM

## 2020-01-28 LAB — COMPREHENSIVE METABOLIC PANEL
ALT: 16 U/L (ref 0–35)
AST: 20 U/L (ref 0–37)
Albumin: 4.3 g/dL (ref 3.5–5.2)
Alkaline Phosphatase: 96 U/L (ref 39–117)
BUN: 17 mg/dL (ref 6–23)
CO2: 26 mEq/L (ref 19–32)
Calcium: 9.7 mg/dL (ref 8.4–10.5)
Chloride: 103 mEq/L (ref 96–112)
Creatinine, Ser: 0.91 mg/dL (ref 0.40–1.20)
GFR: 57.46 mL/min — ABNORMAL LOW (ref >60.00)
Glucose, Bld: 122 mg/dL — ABNORMAL HIGH (ref 70–99)
Potassium: 4 mEq/L (ref 3.5–5.1)
Sodium: 141 mEq/L (ref 135–145)
Total Bilirubin: 0.3 mg/dL (ref 0.2–1.2)
Total Protein: 7.5 g/dL (ref 6.0–8.3)

## 2020-01-28 LAB — CBC
HCT: 40.6 % (ref 36.0–46.0)
Hemoglobin: 13.1 g/dL (ref 12.0–15.0)
MCHC: 32.2 g/dL (ref 30.0–36.0)
MCV: 88.6 fl (ref 78.0–100.0)
Platelets: 236 10*3/uL (ref 150.0–400.0)
RBC: 4.58 Mil/uL (ref 3.87–5.11)
RDW: 14.3 % (ref 11.5–15.5)
WBC: 7.2 10*3/uL (ref 4.0–10.5)

## 2020-01-28 LAB — LIPID PANEL
Cholesterol: 188 mg/dL (ref 0–200)
HDL: 64.9 mg/dL (ref >39.00)
LDL Cholesterol: 101 mg/dL — ABNORMAL HIGH (ref 0–99)
NonHDL: 122.62
Total CHOL/HDL Ratio: 3
Triglycerides: 106 mg/dL (ref 0.0–149.0)
VLDL: 21.2 mg/dL (ref 0.0–40.0)

## 2020-01-28 LAB — HEMOGLOBIN A1C: Hgb A1c MFr Bld: 7.1 % — ABNORMAL HIGH (ref 4.6–6.5)

## 2020-01-28 LAB — TSH: TSH: 1.17 u[IU]/mL (ref 0.35–4.50)

## 2020-01-31 ENCOUNTER — Telehealth (INDEPENDENT_AMBULATORY_CARE_PROVIDER_SITE_OTHER): Payer: Medicare HMO | Admitting: Family Medicine

## 2020-01-31 ENCOUNTER — Other Ambulatory Visit: Payer: Self-pay

## 2020-01-31 DIAGNOSIS — E785 Hyperlipidemia, unspecified: Secondary | ICD-10-CM

## 2020-01-31 DIAGNOSIS — N289 Disorder of kidney and ureter, unspecified: Secondary | ICD-10-CM | POA: Diagnosis not present

## 2020-01-31 DIAGNOSIS — J029 Acute pharyngitis, unspecified: Secondary | ICD-10-CM

## 2020-01-31 DIAGNOSIS — E119 Type 2 diabetes mellitus without complications: Secondary | ICD-10-CM | POA: Diagnosis not present

## 2020-01-31 DIAGNOSIS — I10 Essential (primary) hypertension: Secondary | ICD-10-CM | POA: Diagnosis not present

## 2020-01-31 MED ORDER — HYDROCODONE-HOMATROPINE 5-1.5 MG/5ML PO SYRP: 5.0000 mL | ORAL_SOLUTION | Freq: Two times a day (BID) | ORAL | 0 refills | Status: DC | PRN

## 2020-01-31 MED ORDER — METHYLPREDNISOLONE 4 MG PO TABS: ORAL_TABLET | ORAL | 0 refills | Status: DC

## 2020-01-31 MED ORDER — AZITHROMYCIN 250 MG PO TABS: ORAL_TABLET | ORAL | 0 refills | Status: DC

## 2020-01-31 NOTE — Assessment & Plan Note (Signed)
She is struggling with nasal congestion with clear rhinorrhea and PND, sore throat, headache, fatigue, chills, dry cough that kept her up last night. Symptoms started yesterday. She is vaccinated against covid but her last shot was aobut 6 months ago and her grandson with whom she lives attends school in person. They agree to get her COVID tested and we will start her on Hydromet bid prn, Zpak and Medrol dosepak while we wait. She will need a MAB infusion if she is positive for COVID and they are aware

## 2020-01-31 NOTE — Assessment & Plan Note (Signed)
hgba1c acceptable, minimize simple carbs. Increase exercise as tolerated. Continue current meds 

## 2020-01-31 NOTE — Progress Notes (Signed)
Virtual Visit via Video Note  I connected with Abigail Mcpherson on 01/31/20 at 10:00 AM EDT by a video enabled telemedicine application and verified that I am speaking with the correct person using two identifiers.  Location: Patient: home, patient and her daughter and provider in visit Provider: home   I discussed the limitations of evaluation and management by telemedicine and the availability of in person appointments. The patient expressed understanding and agreed to proceed. Lindell Spar, CMA was able to get the patient setup on a video visit    Subjective:    Patient ID: Abigail Mcpherson, female    DOB: 01/06/1933, 86 y.o.   MRN: [id number]  No chief complaint on file.   HPI Patient is in today for evaluation of a cough and other symptoms that started yesterday. She is struggling with nasal congestion with clear rhinorrhea and PND, sore throat, headache, fatigue, chills, dry cough that kept her up last night. Symptoms started yesterday. She is vaccinated against covid but her last shot was aobut 6 months ago and her grandson with whom she lives attends school in person. She noted some slight wheezing first thing this morning but htat has not persisted and so far she does not think most of her illness is in her chest yet.  Denies CP/palp/SOB/fevers/GI or GU c/o. Taking meds as prescribed Past Medical History:  Diagnosis Date  . Allergy   . Cataract   . Diabetes mellitus without complication (HCC)   . GERD (gastroesophageal reflux disease)   . History of shingles   . Hyperlipidemia   . Hypertension     Past Surgical History:  Procedure Laterality Date  . APPENDECTOMY    . BREAST SURGERY     breast biopsy left , b/l lumpectomy 1969  . BUNIONECTOMY Bilateral   . CESAREAN SECTION      Family History  Problem Relation Age of Onset  . Diabetes Mother   . Hypertension Mother   . Hernia Mother        inguinal  . Diabetes Father   . Stroke Son   . Aneurysm Paternal Aunt          brain  . Cancer Sister        intestinal  . Hypertension Sister   . Diabetes Sister   . Glaucoma Sister   . Diabetes Sister   . Hypertension Sister   . Hyperlipidemia Sister   . Diabetes Sister   . Hypertension Sister   . Hyperlipidemia Sister   . Hernia Sister        umbilical  . Hyperlipidemia Sister   . Hypertension Sister   . Diabetes Sister   . Alcohol abuse Sister   . Diabetes Sister   . Hyperlipidemia Sister   . Hypertension Sister   . Obesity Sister   . Stomach cancer Neg Hx   . Colon cancer Neg Hx   . Esophageal cancer Neg Hx     Social History   Socioeconomic History  . Marital status: Divorced    Spouse name: Not on file  . Number of children: Not on file  . Years of education: Not on file  . Highest education level: Not on file  Occupational History  . Occupation: Nurse    Comment: retired  Tobacco Use  . Smoking status: Former Games developer  . Smokeless tobacco: Never Used  Vaping Use  . Vaping Use: Never used  Substance and Sexual Activity  . Alcohol use: Yes    Comment: occ  .  Drug use: Not Currently  . Sexual activity: Not Currently  Other Topics Concern  . Not on file  Social History Narrative   Avoids pork and shellfish and deep sea fish. Lives with family, daughter, wears seat belt always. Divorced, retired Engineer, civil (consulting)nurse   Social Determinants of Corporate investment bankerHealth   Financial Resource Strain:   . Difficulty of Paying Living Expenses: Not on file  Food Insecurity:   . Worried About Programme researcher, broadcasting/film/videounning Out of Food in the Last Year: Not on file  . Ran Out of Food in the Last Year: Not on file  Transportation Needs:   . Lack of Transportation (Medical): Not on file  . Lack of Transportation (Non-Medical): Not on file  Physical Activity:   . Days of Exercise per Week: Not on file  . Minutes of Exercise per Session: Not on file  Stress:   . Feeling of Stress : Not on file  Social Connections:   . Frequency of Communication with Friends and Family: Not on file  .  Frequency of Social Gatherings with Friends and Family: Not on file  . Attends Religious Services: Not on file  . Active Member of Clubs or Organizations: Not on file  . Attends BankerClub or Organization Meetings: Not on file  . Marital Status: Not on file  Intimate Partner Violence:   . Fear of Current or Ex-Partner: Not on file  . Emotionally Abused: Not on file  . Physically Abused: Not on file  . Sexually Abused: Not on file    Outpatient Medications Prior to Visit  Medication Sig Dispense Refill  . albuterol (VENTOLIN HFA) 108 (90 Base) MCG/ACT inhaler USE 2 INHALATIONS BY MOUTH  EVERY 6 HOURS AS NEEDED FOR WHEEZING OR SHORTNESS OF  BREATH 54 g 1  . amLODipine (NORVASC) 2.5 MG tablet TAKE 1 TABLET BY MOUTH  DAILY 90 tablet 3  . aspirin 81 MG tablet Take 81 mg by mouth daily. Daily OTC    . benzonatate (TESSALON) 100 MG capsule TAKE 1 CAPSULE BY MOUTH TWICE DAILY AS NEEDED FOR COUGH 30 capsule 5  . cetirizine (ZYRTEC) 10 MG tablet Take 1 tablet (10 mg total) by mouth daily. 30 tablet 11  . famotidine (PEPCID) 40 MG tablet Take 1 tablet (40 mg total) by mouth at bedtime. 90 tablet 3  . furosemide (LASIX) 20 MG tablet TAKE 1 TABLET BY MOUTH  DAILY AND A SECOND TABLET  DAILY AS NEEDED FOR PEDAL  EDEMA OR WEIGHT GAIN OF 3  LB IN 24 HOURS 110 tablet 3  . hydrochlorothiazide (MICROZIDE) 12.5 MG capsule Take 1 capsule (12.5 mg total) by mouth daily. 90 capsule 3  . metFORMIN (GLUCOPHAGE-XR) 500 MG 24 hr tablet TAKE 2 TABLETS BY MOUTH IN  THE MORNING AND 1 TABLET IN THE EVENING 270 tablet 3  . montelukast (SINGULAIR) 10 MG tablet TAKE 1 TABLET(10 MG) BY MOUTH AT BEDTIME AS NEEDED FOR ALLERGIES 30 tablet 2  . omeprazole (PRILOSEC) 40 MG capsule Take 1 capsule (40 mg total) by mouth daily. 30 capsule 3  . potassium chloride SA (KLOR-CON) 20 MEQ tablet TAKE 1 TABLET BY MOUTH  DAILY 90 tablet 3  . rosuvastatin (CRESTOR) 5 MG tablet TAKE 1 TABLET BY MOUTH  DAILY 90 tablet 3  . tiZANidine (ZANAFLEX) 2 MG  tablet Take 0.5-1 tablets (1-2 mg total) by mouth at bedtime as needed for muscle spasms. 30 tablet 1   No facility-administered medications prior to visit.    Allergies  Allergen Reactions  .  Iodine Hives  . Shellfish Allergy Rash    Vomiting  Deep sea fish   . Codeine     tinnitus    Review of Systems  Constitutional: Positive for chills and malaise/fatigue. Negative for fever.  HENT: Positive for congestion, sinus pain and sore throat.   Eyes: Negative for blurred vision.  Respiratory: Positive for cough, sputum production and wheezing. Negative for shortness of breath.   Cardiovascular: Negative for chest pain, palpitations and leg swelling.  Gastrointestinal: Negative for abdominal pain, blood in stool and nausea.  Genitourinary: Negative for dysuria and frequency.  Musculoskeletal: Positive for myalgias. Negative for falls.  Skin: Negative for rash.  Neurological: Negative for dizziness, loss of consciousness and headaches.  Endo/Heme/Allergies: Negative for environmental allergies.  Psychiatric/Behavioral: Negative for depression. The patient is not nervous/anxious.        Objective:    Physical Exam Constitutional:      Appearance: Normal appearance. She is not ill-appearing.  HENT:     Head: Normocephalic and atraumatic.     Right Ear: External ear normal.     Left Ear: External ear normal.     Nose: Nose normal.  Pulmonary:     Effort: Pulmonary effort is normal.  Neurological:     Mental Status: She is alert and oriented to person, place, and time.  Psychiatric:        Behavior: Behavior normal.     There were no vitals taken for this visit. Wt Readings from Last 3 Encounters:  01/20/20 167 lb (75.8 kg)  06/10/19 162 lb (73.5 kg)  09/13/18 161 lb (73 kg)    Diabetic Foot Exam - Simple   No data filed     Lab Results  Component Value Date   WBC 7.2 01/28/2020   HGB 13.1 01/28/2020   HCT 40.6 01/28/2020   PLT 236.0 01/28/2020   GLUCOSE 122  (H) 01/28/2020   CHOL 188 01/28/2020   TRIG 106.0 01/28/2020   HDL 64.90 01/28/2020   LDLCALC 101 (H) 01/28/2020   ALT 16 01/28/2020   AST 20 01/28/2020   NA 141 01/28/2020   K 4.0 01/28/2020   CL 103 01/28/2020   CREATININE 0.91 01/28/2020   BUN 17 01/28/2020   CO2 26 01/28/2020   TSH 1.17 01/28/2020   HGBA1C 7.1 (H) 01/28/2020    Lab Results  Component Value Date   TSH 1.17 01/28/2020   Lab Results  Component Value Date   WBC 7.2 01/28/2020   HGB 13.1 01/28/2020   HCT 40.6 01/28/2020   MCV 88.6 01/28/2020   PLT 236.0 01/28/2020   Lab Results  Component Value Date   NA 141 01/28/2020   K 4.0 01/28/2020   CO2 26 01/28/2020   GLUCOSE 122 (H) 01/28/2020   BUN 17 01/28/2020   CREATININE 0.91 01/28/2020   BILITOT 0.3 01/28/2020   ALKPHOS 96 01/28/2020   AST 20 01/28/2020   ALT 16 01/28/2020   PROT 7.5 01/28/2020   ALBUMIN 4.3 01/28/2020   CALCIUM 9.7 01/28/2020   GFR 57.46 (L) 01/28/2020   Lab Results  Component Value Date   CHOL 188 01/28/2020   Lab Results  Component Value Date   HDL 64.90 01/28/2020   Lab Results  Component Value Date   LDLCALC 101 (H) 01/28/2020   Lab Results  Component Value Date   TRIG 106.0 01/28/2020   Lab Results  Component Value Date   CHOLHDL 3 01/28/2020   Lab Results  Component Value Date  HGBA1C 7.1 (H) 01/28/2020       Assessment & Plan:   Problem List Items Addressed This Visit    Pharyngitis    She is struggling with nasal congestion with clear rhinorrhea and PND, sore throat, headache, fatigue, chills, dry cough that kept her up last night. Symptoms started yesterday. She is vaccinated against covid but her last shot was aobut 6 months ago and her grandson with whom she lives attends school in person. They agree to get her COVID tested and we will start her on Hydromet bid prn, Zpak and Medrol dosepak while we wait. She will need a MAB infusion if she is positive for COVID and they are aware      Diabetes  mellitus without complication (HCC)    hgba1c acceptable, minimize simple carbs. Increase exercise as tolerated. Continue current meds      Relevant Orders   Hemoglobin A1c   Hyperlipidemia   Relevant Orders   Lipid panel   Hypertension    Monitor and report any concerns, no changes to meds. Encouraged heart healthy diet such as the DASH diet and exercise as tolerated.       Relevant Orders   CBC   Comprehensive metabolic panel   TSH    Other Visit Diagnoses    Renal insufficiency    -  Primary   Relevant Orders   Comprehensive metabolic panel      I am having Abigail Mcpherson start on methylPREDNISolone, azithromycin, and HYDROcodone-homatropine. I am also having her maintain her aspirin, cetirizine, tiZANidine, amLODipine, rosuvastatin, potassium chloride SA, furosemide, hydrochlorothiazide, albuterol, benzonatate, omeprazole, montelukast, metFORMIN, and famotidine.  Meds ordered this encounter  Medications  . methylPREDNISolone (MEDROL) 4 MG tablet    Sig: 5 tab po qd X 1d then 4 tab po qd X 1d then 3 tab po qd X 1d then 2 tab po qd then 1 tab po qd    Dispense:  15 tablet    Refill:  0  . azithromycin (ZITHROMAX) 250 MG tablet    Sig: 2 tabs po once and then 1 tab po daily x 4 x days    Dispense:  6 tablet    Refill:  0  . HYDROcodone-homatropine (HYCODAN) 5-1.5 MG/5ML syrup    Sig: Take 5 mLs by mouth 2 (two) times daily as needed for cough.    Dispense:  150 mL    Refill:  0    I discussed the assessment and treatment plan with the patient. The patient was provided an opportunity to ask questions and all were answered. The patient agreed with the plan and demonstrated an understanding of the instructions.   The patient was advised to call back or seek an in-person evaluation if the symptoms worsen or if the condition fails to improve as anticipated.  I provided 20 minutes of non-face-to-face time during this encounter.   Danise Edge, MD

## 2020-01-31 NOTE — Assessment & Plan Note (Signed)
Monitor and report any concerns, no changes to meds. Encouraged heart healthy diet such as the DASH diet and exercise as tolerated.  ?

## 2020-02-01 ENCOUNTER — Other Ambulatory Visit: Payer: Self-pay

## 2020-02-01 ENCOUNTER — Other Ambulatory Visit: Payer: Medicare HMO

## 2020-02-01 DIAGNOSIS — Z20822 Contact with and (suspected) exposure to covid-19: Secondary | ICD-10-CM

## 2020-02-02 LAB — SARS-COV-2, NAA 2 DAY TAT

## 2020-02-02 LAB — NOVEL CORONAVIRUS, NAA: SARS-CoV-2, NAA: NOT DETECTED

## 2020-02-03 ENCOUNTER — Other Ambulatory Visit: Payer: Self-pay | Admitting: Family Medicine

## 2020-02-03 ENCOUNTER — Other Ambulatory Visit: Payer: Self-pay

## 2020-02-03 ENCOUNTER — Encounter: Payer: Self-pay | Admitting: Family Medicine

## 2020-02-03 ENCOUNTER — Telehealth: Payer: Self-pay

## 2020-02-03 MED ORDER — METHYLPREDNISOLONE 4 MG PO TABS: ORAL_TABLET | ORAL | 0 refills | Status: DC

## 2020-02-03 MED ORDER — HYDROCODONE-HOMATROPINE 5-1.5 MG/5ML PO SYRP: 5.0000 mL | ORAL_SOLUTION | Freq: Two times a day (BID) | ORAL | 0 refills | Status: DC | PRN

## 2020-02-03 MED ORDER — AZITHROMYCIN 250 MG PO TABS
ORAL_TABLET | ORAL | 0 refills | Status: DC
Start: 2020-02-03 — End: 2020-02-17

## 2020-02-03 NOTE — Telephone Encounter (Signed)
done

## 2020-02-03 NOTE — Telephone Encounter (Signed)
E-prescribing was down 01/31/2020- can you resnd Hycodan syrup please?

## 2020-02-14 ENCOUNTER — Other Ambulatory Visit: Payer: Self-pay | Admitting: Family Medicine

## 2020-02-14 DIAGNOSIS — E119 Type 2 diabetes mellitus without complications: Secondary | ICD-10-CM

## 2020-02-17 ENCOUNTER — Encounter: Payer: Self-pay | Admitting: Gastroenterology

## 2020-02-17 ENCOUNTER — Ambulatory Visit (AMBULATORY_SURGERY_CENTER): Payer: Medicare HMO | Admitting: Gastroenterology

## 2020-02-17 ENCOUNTER — Other Ambulatory Visit: Payer: Self-pay

## 2020-02-17 VITALS — BP 147/75 | HR 65 | Temp 96.8°F | Resp 25 | Ht 59.0 in | Wt 167.0 lb

## 2020-02-17 DIAGNOSIS — K259 Gastric ulcer, unspecified as acute or chronic, without hemorrhage or perforation: Secondary | ICD-10-CM

## 2020-02-17 DIAGNOSIS — K219 Gastro-esophageal reflux disease without esophagitis: Secondary | ICD-10-CM | POA: Diagnosis not present

## 2020-02-17 DIAGNOSIS — K3189 Other diseases of stomach and duodenum: Secondary | ICD-10-CM

## 2020-02-17 DIAGNOSIS — K449 Diaphragmatic hernia without obstruction or gangrene: Secondary | ICD-10-CM | POA: Diagnosis not present

## 2020-02-17 MED ORDER — SODIUM CHLORIDE 0.9 % IV SOLN: 500.0000 mL | Freq: Once | INTRAVENOUS | Status: DC

## 2020-02-17 NOTE — Progress Notes (Signed)
Vital signs by Renette Butters

## 2020-02-17 NOTE — Progress Notes (Signed)
PT taken to PACU. Monitors in place. VSS. Report given to RN. 

## 2020-02-17 NOTE — Patient Instructions (Addendum)
HANDOUTS PROVIDED ON: HIATAL HERNIA  The biopsies taken today have been sent for pathology.  The results can take 1-3 weeks to receive.    You may resume your previous diet and medication schedule.  No aspirin, ibuprofen, naproxen, or other NSAIDs.  Thank you for allowing [place] to care for you today!!!   YOU HAD AN ENDOSCOPIC PROCEDURE TODAY AT THE [facility]:   Refer to the procedure report that was given to you for any specific questions about what was found during the examination.  If the procedure report does not answer your questions, please call your gastroenterologist to clarify.  If you requested that your care partner not be given the details of your procedure findings, then the procedure report has been included in a sealed envelope for you to review at your convenience later.  YOU SHOULD EXPECT: Some feelings of bloating in the abdomen. Passage of more gas than usual.  Walking can help get rid of the air that was put into your GI tract during the procedure and reduce the bloating.   Please Note:  You might notice some irritation and congestion in your nose or some drainage.  This is from the oxygen used during your procedure.  There is no need for concern and it should clear up in a day or so.  SYMPTOMS TO REPORT IMMEDIATELY:   Following upper endoscopy (EGD)  Vomiting of blood or coffee ground material  New chest pain or pain under the shoulder blades  Painful or persistently difficult swallowing  New shortness of breath  Fever of 100F or higher  Black, tarry-looking stools  For urgent or emergent issues, a gastroenterologist can be reached at any hour by calling [phone]. Do not use MyChart messaging for urgent concerns.    DIET:  We do recommend a small meal at first, but then you may proceed to your regular diet.  Drink plenty of fluids but you should avoid alcoholic beverages for 24 hours.  ACTIVITY:  You should plan to take it easy for the rest of  today and you should NOT DRIVE or use heavy machinery until tomorrow (because of the sedation medicines used during the test).    FOLLOW UP: Our staff will call the number listed on your records Wednesday morning between 7:15 am and 8:15 am to check on you and address any questions or concerns that you may have regarding the information given to you following your procedure. If we do not reach you, we will leave a message.  We will attempt to reach you two times.  During this call, we will ask if you have developed any symptoms of COVID 19. If you develop any symptoms (ie: fever, flu-like symptoms, shortness of breath, cough etc.) before then, please call [phone].  If you test positive for Covid 19 in the 2 weeks post procedure, please call and report this information to [place].    If any biopsies were taken you will be contacted by phone or by letter within the next 1-3 weeks.  Please call us at [phone] if you have not heard about the biopsies in 3 weeks.    SIGNATURES/CONFIDENTIALITY: You and/or your care partner have signed paperwork which will be entered into your electronic medical record.  These signatures attest to the fact that that the information above on your After Visit Summary has been reviewed and is understood.  Full responsibility of the confidentiality of this discharge information lies with you and/or your care-partner.

## 2020-02-17 NOTE — Op Note (Signed)
[facility] Patient Name: Abigail Mcpherson Procedure Date: 02/17/2020 10:14 AM MRN: [id number] Endoscopist: Napoleon Form , MD Age: 86 Referring MD:  Date of Birth: [birth year] Gender: Female Account #: [id number] Procedure:                Upper GI endoscopy Indications:              Globus sensation. Esophageal reflux symptoms that                            persist despite appropriate therapy Procedure:                Pre-Anesthesia Assessment:                           - Prior to the procedure, a History and Physical                            was performed, and patient medications and                            allergies were reviewed. The patient's tolerance of                            previous anesthesia was also reviewed. The risks                            and benefits of the procedure and the sedation                            options and risks were discussed with the patient.                            All questions were answered, and informed consent                            was obtained. Prior Anticoagulants: The patient has                            taken no previous anticoagulant or antiplatelet                            agents. ASA Grade Assessment: II - A patient with                            mild systemic disease. After reviewing the risks                            and benefits, the patient was deemed in                            satisfactory condition to undergo the procedure.                           After obtaining informed consent, the endoscope was  passed under direct vision. Throughout the                            procedure, the patient's blood pressure, pulse, and                            oxygen saturations were monitored continuously. The                            Endoscope was introduced through the mouth, and                            advanced to the second part of duodenum. The upper                             GI endoscopy was accomplished without difficulty.                            The patient tolerated the procedure well. Scope In: Scope Out: Findings:                 The Z-line was regular and was found 35 cm from the                            incisors.                           The gastroesophageal flap valve was visualized                            endoscopically and classified as Hill Grade III                            (minimal fold, loose to endoscope, hiatal hernia                            likely).                           A small hiatal hernia was present.                           One non-bleeding cratered gastric ulcer with a                            clean ulcer base (Forrest Class III) was found in                            the prepyloric region of the stomach. The lesion                            was less than one mm in largest dimension. Biopsies                            were taken with  a cold forceps for Helicobacter                            pylori testing.                           The examined duodenum was normal. Complications:            No immediate complications. Estimated Blood Loss:     Estimated blood loss was minimal. Impression:               - Z-line regular, 35 cm from the incisors.                           - Gastroesophageal flap valve classified as Hill                            Grade III (minimal fold, loose to endoscope, hiatal                            hernia likely).                           - Small hiatal hernia.                           - Non-bleeding gastric ulcer with a clean ulcer                            base (Forrest Class III). Biopsied.                           - Normal examined duodenum. Recommendation:           - Patient has a contact number available for                            emergencies. The signs and symptoms of potential                            delayed complications were discussed with the                             patient. Return to normal activities tomorrow.                            Written discharge instructions were provided to the                            patient.                           - Resume previous diet.                           - Continue present medications.                           -  Await pathology results.                           - No aspirin, ibuprofen, naproxen, or other                            non-steroidal anti-inflammatory drugs.                           - Return to GI office in 3 months. Napoleon Form, MD 02/17/2020 10:39:42 AM This report has been signed electronically.

## 2020-02-19 ENCOUNTER — Telehealth: Payer: Self-pay | Admitting: *Deleted

## 2020-02-19 NOTE — Telephone Encounter (Signed)
°  Follow up Call-  Call back number 02/17/2020  Post procedure Call Back phone  # [phone]  Permission to leave phone message Yes     Patient questions:  Do you have a fever, pain , or abdominal swelling? No. Pain Score  0 *  Have you tolerated food without any problems? Yes.    Have you been able to return to your normal activities? Yes.    Do you have any questions about your discharge instructions: Diet   No. Medications  No. Follow up visit  No.  Do you have questions or concerns about your Care? Yes.    Actions: * If pain score is 4 or above: No action needed, pain <4.  1. Have you developed a fever since your procedure? no  2.   Have you had an respiratory symptoms (SOB or cough) since your procedure? no  3.   Have you tested positive for COVID 19 since your procedure no  4.   Have you had any family members/close contacts diagnosed with the COVID 19 since your procedure?  no   If yes to any of these questions please route to Laverna Peace, RN and Karlton Lemon, RN

## 2020-02-25 ENCOUNTER — Other Ambulatory Visit: Payer: Self-pay | Admitting: Family Medicine

## 2020-02-28 ENCOUNTER — Other Ambulatory Visit: Payer: Self-pay

## 2020-02-28 ENCOUNTER — Ambulatory Visit: Payer: Medicare HMO | Admitting: Podiatry

## 2020-02-28 DIAGNOSIS — M2041 Other hammer toe(s) (acquired), right foot: Secondary | ICD-10-CM | POA: Diagnosis not present

## 2020-02-28 DIAGNOSIS — M79674 Pain in right toe(s): Secondary | ICD-10-CM

## 2020-02-28 DIAGNOSIS — M79675 Pain in left toe(s): Secondary | ICD-10-CM | POA: Diagnosis not present

## 2020-02-28 DIAGNOSIS — E119 Type 2 diabetes mellitus without complications: Secondary | ICD-10-CM

## 2020-02-28 DIAGNOSIS — B351 Tinea unguium: Secondary | ICD-10-CM

## 2020-02-28 DIAGNOSIS — M2042 Other hammer toe(s) (acquired), left foot: Secondary | ICD-10-CM

## 2020-03-02 ENCOUNTER — Encounter: Payer: Self-pay | Admitting: Gastroenterology

## 2020-03-03 ENCOUNTER — Encounter: Payer: Self-pay | Admitting: Podiatry

## 2020-03-03 NOTE — Progress Notes (Signed)
  Subjective:  Patient ID: Abigail Mcpherson, female    DOB: [birth year],  MRN: [id number]  Chief Complaint  Patient presents with  . Callouses    left hallux painful callus    86 y.o. female returns for the above complaint.  Patient presents with thickened elongated dystrophic toenails x10.  Patient would like to have them to be done as she is not able to do so.  They are mildly painful to touch.  Painful when walking.  She is a type II diabetic with last A1c of 7.1.  She is also ensuring getting diabetic shoes as she has uncontrolled sugars she has hammertoe contractures at likely be attributed to excessive pressure and could lead to ulceration.  Objective:  There were no vitals filed for this visit. Podiatric Exam: Vascular: dorsalis pedis and posterior tibial pulses are palpable bilateral. Capillary return is immediate. Temperature gradient is WNL. Skin turgor WNL  Sensorium: Diminished Semmes Weinstein monofilament test.  Diminished tactile sensation bilaterally. Nail Exam: Pt has thick disfigured discolored nails with subungual debris noted bilateral entire nail hallux through fifth toenails.  Pain on palpation to the nails. Ulcer Exam: There is no evidence of ulcer or pre-ulcerative changes or infection. Orthopedic Exam: Muscle tone and strength are WNL. No limitations in general ROM. No crepitus or effusions noted. HAV  B/L.  Hammer toes 2-5  B/L. Skin: No Porokeratosis. No infection or ulcers    Assessment & Plan:   1. Diabetes mellitus without complication (HCC)   2. Pain due to onychomycosis of toenails of both feet   3. Hammertoe, bilateral     Patient was evaluated and treated and all questions answered.  Hammertoe contractures bilateral 2 through 5 -I explained to patient the etiology of contractures and various treatment options were discussed.  Given the patient is an uncontrolled diabetic with last A1c of 7.1 I believe she will benefit from diabetic shoes to evenly  distribute the pressure and offload the contractures therefore preventing ulcerations in future amputations.  Patient agrees with the plan. -She will be scheduled see rec for diabetic shoes  Onychomycosis with pain  -Nails palliatively debrided as below. -Educated on self-care  Procedure: Nail Debridement Rationale: pain  Type of Debridement: manual, sharp debridement. Instrumentation: Nail nipper, rotary burr. Number of Nails: 10  Procedures and Treatment: Consent by patient was obtained for treatment procedures. The patient understood the discussion of treatment and procedures well. All questions were answered thoroughly reviewed. Debridement of mycotic and hypertrophic toenails, 1 through 5 bilateral and clearing of subungual debris. No ulceration, no infection noted.  Return Visit-Office Procedure: Patient instructed to return to the office for a follow up visit 3 months for continued evaluation and treatment.  Nicholes Rough, DPM    No follow-ups on file.

## 2020-03-17 ENCOUNTER — Other Ambulatory Visit: Payer: Self-pay | Admitting: Family Medicine

## 2020-03-19 ENCOUNTER — Ambulatory Visit: Payer: Medicare HMO | Admitting: Orthotics

## 2020-03-19 ENCOUNTER — Other Ambulatory Visit: Payer: Self-pay

## 2020-03-19 DIAGNOSIS — M2041 Other hammer toe(s) (acquired), right foot: Secondary | ICD-10-CM

## 2020-03-19 DIAGNOSIS — M2042 Other hammer toe(s) (acquired), left foot: Secondary | ICD-10-CM

## 2020-03-19 DIAGNOSIS — E119 Type 2 diabetes mellitus without complications: Secondary | ICD-10-CM

## 2020-03-19 NOTE — Progress Notes (Signed)

## 2020-03-23 ENCOUNTER — Telehealth: Payer: Self-pay | Admitting: Orthotics

## 2020-04-01 NOTE — Telephone Encounter (Signed)
called

## 2020-04-05 ENCOUNTER — Encounter: Payer: Self-pay | Admitting: Podiatry

## 2020-04-07 ENCOUNTER — Other Ambulatory Visit: Payer: Self-pay

## 2020-04-07 ENCOUNTER — Ambulatory Visit: Payer: Medicare HMO | Admitting: Podiatry

## 2020-04-07 DIAGNOSIS — E119 Type 2 diabetes mellitus without complications: Secondary | ICD-10-CM

## 2020-04-07 DIAGNOSIS — S90822A Blister (nonthermal), left foot, initial encounter: Secondary | ICD-10-CM | POA: Diagnosis not present

## 2020-04-08 ENCOUNTER — Encounter: Payer: Self-pay | Admitting: Podiatry

## 2020-04-08 NOTE — Progress Notes (Signed)
Subjective:  Patient ID: Abigail Mcpherson, female    DOB: 10/26/1932,  MRN: [id number]  Chief Complaint  Patient presents with  . Blister    PT stated that she has a blister on her left hallux. Stated that it started about 2 weeks ago and went away and came back about 3 days ago. PT stated that it is very painful.    86 y.o. female presents with the above complaint.  Patient presents with complaint of left hallux blister formation.  Patient states that about 2 weeks ago it went away and came back about 3 days ago.  Patient has been wearing different types of shoes.  She does not recall how it happened.  She is a diabetic and just wanted to make sure that there was no underlying ulceration.  She denies any other acute complaints.  She has been just, protecting the blister has not been doing anything else.   Review of Systems: Negative except as noted in the HPI. Denies N/V/F/Ch.  Past Medical History:  Diagnosis Date  . Allergy   . Cataract   . Diabetes mellitus without complication (HCC)   . GERD (gastroesophageal reflux disease)   . History of shingles   . Hyperlipidemia   . Hypertension   . Sleep apnea    cannot tolerate CPAP    Current Outpatient Medications:  .  albuterol (VENTOLIN HFA) 108 (90 Base) MCG/ACT inhaler, USE 2 INHALATIONS BY MOUTH  EVERY 6 HOURS AS NEEDED FOR WHEEZING OR SHORTNESS OF  BREATH, Disp: 54 g, Rfl: 1 .  amLODipine (NORVASC) 2.5 MG tablet, Take 1 tablet (2.5 mg total) by mouth daily., Disp: 90 tablet, Rfl: 1 .  aspirin 81 MG tablet, Take 81 mg by mouth daily. Daily OTC, Disp: , Rfl:  .  cetirizine (ZYRTEC) 10 MG tablet, Take 1 tablet (10 mg total) by mouth daily., Disp: 30 tablet, Rfl: 11 .  famotidine (PEPCID) 40 MG tablet, Take 1 tablet (40 mg total) by mouth at bedtime., Disp: 90 tablet, Rfl: 3 .  furosemide (LASIX) 20 MG tablet, TAKE 1 TABLET BY MOUTH  DAILY AND A SECOND TABLET  DAILY AS NEEDED FOR PEDAL  EDEMA OR WEIGHT GAIN OF 3  LB IN 24 HOURS,  Disp: 110 tablet, Rfl: 1 .  hydrochlorothiazide (MICROZIDE) 12.5 MG capsule, Take 1 capsule (12.5 mg total) by mouth daily., Disp: 90 capsule, Rfl: 3 .  HYDROcodone-homatropine (HYCODAN) 5-1.5 MG/5ML syrup, Take 5 mLs by mouth 2 (two) times daily as needed for cough., Disp: 150 mL, Rfl: 0 .  metFORMIN (GLUCOPHAGE-XR) 500 MG 24 hr tablet, TAKE 2 TABLETS BY MOUTH IN  THE MORNING AND 1 TABLET IN THE EVENING, Disp: 270 tablet, Rfl: 3 .  montelukast (SINGULAIR) 10 MG tablet, TAKE 1 TABLET(10 MG) BY MOUTH AT BEDTIME AS NEEDED FOR ALLERGIES, Disp: 30 tablet, Rfl: 2 .  omeprazole (PRILOSEC) 40 MG capsule, Take 1 capsule (40 mg total) by mouth daily., Disp: 90 capsule, Rfl: 3 .  potassium chloride SA (KLOR-CON) 20 MEQ tablet, Take 1 tablet (20 mEq total) by mouth daily., Disp: 90 tablet, Rfl: 1 .  rosuvastatin (CRESTOR) 5 MG tablet, Take 1 tablet (5 mg total) by mouth daily., Disp: 90 tablet, Rfl: 1 .  tiZANidine (ZANAFLEX) 2 MG tablet, Take 0.5-1 tablets (1-2 mg total) by mouth at bedtime as needed for muscle spasms., Disp: 30 tablet, Rfl: 1  Social History   Tobacco Use  Smoking Status Former Smoker  . Types: Cigarettes  Smokeless Tobacco  Never Used    Allergies  Allergen Reactions  . Iodine Hives  . Shellfish Allergy Rash    Vomiting  Deep sea fish   . Codeine     tinnitus  . Demerol [Meperidine Hcl]     hyperactivity   Objective:  There were no vitals filed for this visit. There is no height or weight on file to calculate BMI. Constitutional Well developed. Well nourished.  Vascular Dorsalis pedis pulses palpable bilaterally. Posterior tibial pulses palpable bilaterally. Capillary refill normal to all digits.  No cyanosis or clubbing noted. Pedal hair growth normal.  Neurologic Normal speech. Oriented to person, place, and time. Epicritic sensation to light touch grossly present bilaterally.  Dermatologic  friction blister noted to the left distal tip of the hallux.  Good  perfusion noted to the digit.  No concern for underlying ulceration.  The blister was drained with serous fluid expressed.  No purulent drainage noted.  No clinical signs of infection noted.  Orthopedic: Normal joint ROM without pain or crepitus bilaterally. No visible deformities. No bony tenderness.   Radiographs: None Assessment:   1. Diabetes mellitus without complication (HCC)   2. Friction blister of the foot, left, initial encounter    Plan:  Patient was evaluated and treated and all questions answered.  Left distal hallux friction blister -I explained to the patient the etiology of friction blister and various treatment options were discussed.  Given that patient is a diabetic she she is at a high risk of developing ulceration.  I encouraged her to apply triple antibiotic and a Band-Aid.  She is allergic to Betadine.  Upon draining the blister there was no purulent drainage noted. -At this time I am not concerned for an acute ulceration.  No clinical signs of infection I will hold off on antibiotics for now. -I will see her back again in few weeks and will reevaluate if there is no improvement we will discuss other treatment options.  No follow-ups on file.

## 2020-04-22 ENCOUNTER — Telehealth: Payer: Self-pay

## 2020-04-22 ENCOUNTER — Ambulatory Visit: Payer: Medicare HMO | Admitting: Orthopedic Surgery

## 2020-04-22 DIAGNOSIS — M1712 Unilateral primary osteoarthritis, left knee: Secondary | ICD-10-CM

## 2020-04-22 NOTE — Telephone Encounter (Signed)
Can we get patient approved for left knee gel injection 

## 2020-04-22 NOTE — Telephone Encounter (Signed)
Noted  

## 2020-04-25 ENCOUNTER — Encounter: Payer: Self-pay | Admitting: Orthopedic Surgery

## 2020-04-25 NOTE — Progress Notes (Signed)
Office Visit Note   Patient: Abigail Mcpherson           Date of Birth: [birth year]           MRN: [id number] Visit Date: 04/22/2020 Requested by: [name], MD [address] [place],  [place] [address] PCP: [name], MD  Subjective: Chief Complaint  Patient presents with  . Other     Wanting injection    HPI: Torey Regan is a 87 y.o. female who presents to the office complaining of left knee pain.  She has history of left knee osteoarthritis.  She requests injection today.  She denies any recent changes or injuries to her left knee history.  Denies any groin pain, radicular pain.  No mechanical symptoms of the left knee..                ROS: All systems reviewed are negative as they relate to the chief complaint within the history of present illness.  Patient denies fevers or chills.  Assessment & Plan: Visit Diagnoses:  1. Unilateral primary osteoarthritis, left knee     Plan: Patient is a 87 year old female presents complaining of left knee pain.  She has history of left knee arthroscopy arthritis.  She requests left knee injection today.  Last left knee injection was on 10/16/2019 which was a gel injection.  Plan to administer cortisone injection to the left knee today.  Last A1c was 7.1 in October 2021.  Patient tolerated the procedure well.  Preapproved patient for left knee gel injection.  Follow-up after the left knee cortisone injection wears off.  This patient is diagnosed with osteoarthritis of the knee(s).    Radiographs show evidence of joint space narrowing, osteophytes, subchondral sclerosis and/or subchondral cysts.  This patient has knee pain which interferes with functional and activities of daily living.    This patient has experienced inadequate response, adverse effects and/or intolerance with conservative treatments such as acetaminophen, NSAIDS, topical creams, physical therapy or regular exercise, knee bracing and/or weight loss.    This patient has experienced inadequate response or has a contraindication to intra articular steroid injections for at least 3 months.   This patient is not scheduled to have a total knee replacement within 6 months of starting treatment with viscosupplementation.   Follow-Up Instructions: No follow-ups on file.   Orders:  No orders of the defined types were placed in this encounter.  No orders of the defined types were placed in this encounter.     Procedures: Large Joint Inj: L knee on 04/29/2020 12:20 PM Indications: diagnostic evaluation, joint swelling and pain Details: 18 G 1.5 in needle, superolateral approach  Arthrogram: No  Medications: 5 mL lidocaine 1 %; 40 mg methylPREDNISolone acetate 40 MG/ML; 4 mL bupivacaine 0.25 % Outcome: tolerated well, no immediate complications Procedure, treatment alternatives, risks and benefits explained, specific risks discussed. Consent was given by the patient. Immediately prior to procedure a time out was called to verify the correct patient, procedure, equipment, support staff and site/side marked as required. Patient was prepped and draped in the usual sterile fashion.       Clinical Data: No additional findings.  Objective: Vital Signs: There were no vitals taken for this visit.  Physical Exam:  Constitutional: Patient appears well-developed HEENT:  Head: Normocephalic Eyes:EOM are normal Neck: Normal range of motion Cardiovascular: Normal rate Pulmonary/chest: Effort normal Neurologic: Patient is alert Skin: Skin is warm Psychiatric: Patient has normal mood and  affect  Ortho Exam: Ortho exam demonstrates no significant effusion of the left knee.  Tenderness over the medial lateral joint lines.  Able to perform straight leg raise and extensor mechanism intact.  No pain with hip range of motion.  Specialty Comments:  No specialty comments available.  Imaging: No results found.   PMFS History: Patient Active  Problem List   Diagnosis Date Noted  . Glaucoma 12/09/2019  . Reflux esophagitis 12/09/2019  . Pedal edema 08/21/2018  . Pharyngitis 05/29/2018  . Urinary incontinence 05/29/2018  . Knee pain, bilateral 05/29/2018  . Allergy   . History of shingles   . Diabetes mellitus without complication (HCC)   . Hyperlipidemia   . Hypertension   . Cataract    Past Medical History:  Diagnosis Date  . Allergy   . Cataract   . Diabetes mellitus without complication (HCC)   . GERD (gastroesophageal reflux disease)   . History of shingles   . Hyperlipidemia   . Hypertension   . Sleep apnea    cannot tolerate CPAP    Family History  Problem Relation Age of Onset  . Diabetes Mother   . Hypertension Mother   . Hernia Mother        inguinal  . Diabetes Father   . Stroke Son   . Aneurysm Paternal Aunt        brain  . Cancer Sister        intestinal  . Hypertension Sister   . Diabetes Sister   . Glaucoma Sister   . Diabetes Sister   . Hypertension Sister   . Hyperlipidemia Sister   . Diabetes Sister   . Hypertension Sister   . Hyperlipidemia Sister   . Hernia Sister        umbilical  . Hyperlipidemia Sister   . Hypertension Sister   . Diabetes Sister   . Alcohol abuse Sister   . Diabetes Sister   . Hyperlipidemia Sister   . Hypertension Sister   . Obesity Sister   . Stomach cancer Neg Hx   . Colon cancer Neg Hx   . Esophageal cancer Neg Hx   . Colon polyps Neg Hx     Past Surgical History:  Procedure Laterality Date  . APPENDECTOMY    . BREAST SURGERY     breast biopsy left , b/l lumpectomy 1969  . BUNIONECTOMY Bilateral   . CESAREAN SECTION     Social History   Occupational History  . Occupation: Nurse    Comment: retired  Tobacco Use  . Smoking status: Former Smoker    Types: Cigarettes  . Smokeless tobacco: Never Used  Vaping Use  . Vaping Use: Never used  Substance and Sexual Activity  . Alcohol use: Yes    Comment: occ  . Drug use: Never  . Sexual  activity: Not Currently

## 2020-04-29 ENCOUNTER — Other Ambulatory Visit: Payer: Self-pay

## 2020-04-29 ENCOUNTER — Ambulatory Visit: Payer: Medicare HMO | Admitting: Podiatry

## 2020-04-29 DIAGNOSIS — M1712 Unilateral primary osteoarthritis, left knee: Secondary | ICD-10-CM | POA: Diagnosis not present

## 2020-04-29 DIAGNOSIS — E119 Type 2 diabetes mellitus without complications: Secondary | ICD-10-CM | POA: Diagnosis not present

## 2020-04-29 DIAGNOSIS — S90822A Blister (nonthermal), left foot, initial encounter: Secondary | ICD-10-CM | POA: Diagnosis not present

## 2020-04-29 MED ORDER — LIDOCAINE HCL 1 % IJ SOLN
5.0000 mL | INTRAMUSCULAR | Status: AC | PRN
  Administered 2020-04-29: 5 mL

## 2020-04-29 MED ORDER — METHYLPREDNISOLONE ACETATE 40 MG/ML IJ SUSP
40.0000 mg | INTRAMUSCULAR | Status: AC | PRN
  Administered 2020-04-29: 40 mg via INTRA_ARTICULAR

## 2020-04-29 MED ORDER — BUPIVACAINE HCL 0.25 % IJ SOLN
4.0000 mL | INTRAMUSCULAR | Status: AC | PRN
  Administered 2020-04-29: 4 mL via INTRA_ARTICULAR

## 2020-05-05 ENCOUNTER — Encounter: Payer: Self-pay | Admitting: Podiatry

## 2020-05-05 NOTE — Progress Notes (Signed)
Subjective:  Patient ID: Abigail Mcpherson, female    DOB: Jan 31, 1933,  MRN: [id number]  Chief Complaint  Patient presents with  . Diabetes    87 y.o. female presents with the above complaint.  Patient presents with a follow-up of left hallux blister formation.  Patient states is doing a lot better.  It has completely healed.  She has been taking care of the local wound care.  She denies any other acute complaints.  Review of Systems: Negative except as noted in the HPI. Denies N/V/F/Ch.  Past Medical History:  Diagnosis Date  . Allergy   . Cataract   . Diabetes mellitus without complication (HCC)   . GERD (gastroesophageal reflux disease)   . History of shingles   . Hyperlipidemia   . Hypertension   . Sleep apnea    cannot tolerate CPAP    Current Outpatient Medications:  .  albuterol (VENTOLIN HFA) 108 (90 Base) MCG/ACT inhaler, USE 2 INHALATIONS BY MOUTH  EVERY 6 HOURS AS NEEDED FOR WHEEZING OR SHORTNESS OF  BREATH, Disp: 54 g, Rfl: 1 .  amLODipine (NORVASC) 2.5 MG tablet, Take 1 tablet (2.5 mg total) by mouth daily., Disp: 90 tablet, Rfl: 1 .  aspirin 81 MG tablet, Take 81 mg by mouth daily. Daily OTC, Disp: , Rfl:  .  cetirizine (ZYRTEC) 10 MG tablet, Take 1 tablet (10 mg total) by mouth daily., Disp: 30 tablet, Rfl: 11 .  famotidine (PEPCID) 40 MG tablet, Take 1 tablet (40 mg total) by mouth at bedtime., Disp: 90 tablet, Rfl: 3 .  furosemide (LASIX) 20 MG tablet, TAKE 1 TABLET BY MOUTH  DAILY AND A SECOND TABLET  DAILY AS NEEDED FOR PEDAL  EDEMA OR WEIGHT GAIN OF 3  LB IN 24 HOURS, Disp: 110 tablet, Rfl: 1 .  hydrochlorothiazide (MICROZIDE) 12.5 MG capsule, Take 1 capsule (12.5 mg total) by mouth daily., Disp: 90 capsule, Rfl: 3 .  HYDROcodone-homatropine (HYCODAN) 5-1.5 MG/5ML syrup, Take 5 mLs by mouth 2 (two) times daily as needed for cough., Disp: 150 mL, Rfl: 0 .  metFORMIN (GLUCOPHAGE-XR) 500 MG 24 hr tablet, TAKE 2 TABLETS BY MOUTH IN  THE MORNING AND 1 TABLET IN THE  EVENING, Disp: 270 tablet, Rfl: 3 .  montelukast (SINGULAIR) 10 MG tablet, TAKE 1 TABLET(10 MG) BY MOUTH AT BEDTIME AS NEEDED FOR ALLERGIES, Disp: 30 tablet, Rfl: 2 .  omeprazole (PRILOSEC) 40 MG capsule, Take 1 capsule (40 mg total) by mouth daily., Disp: 90 capsule, Rfl: 3 .  potassium chloride SA (KLOR-CON) 20 MEQ tablet, Take 1 tablet (20 mEq total) by mouth daily., Disp: 90 tablet, Rfl: 1 .  rosuvastatin (CRESTOR) 5 MG tablet, Take 1 tablet (5 mg total) by mouth daily., Disp: 90 tablet, Rfl: 1 .  tiZANidine (ZANAFLEX) 2 MG tablet, Take 0.5-1 tablets (1-2 mg total) by mouth at bedtime as needed for muscle spasms., Disp: 30 tablet, Rfl: 1  Social History   Tobacco Use  Smoking Status Former Smoker  . Types: Cigarettes  Smokeless Tobacco Never Used    Allergies  Allergen Reactions  . Iodine Hives  . Shellfish Allergy Rash    Vomiting  Deep sea fish   . Codeine     tinnitus  . Demerol [Meperidine Hcl]     hyperactivity   Objective:  There were no vitals filed for this visit. There is no height or weight on file to calculate BMI. Constitutional Well developed. Well nourished.  Vascular Dorsalis pedis pulses palpable bilaterally. Posterior  tibial pulses palpable bilaterally. Capillary refill normal to all digits.  No cyanosis or clubbing noted. Pedal hair growth normal.  Neurologic Normal speech. Oriented to person, place, and time. Epicritic sensation to light touch grossly present bilaterally.  Dermatologic  no further friction blister noted to the left distal tip of the hallux.  Good perfusion noted to the digit.  No further concern for underlying ulceration.    No purulent drainage noted.  Skin completely epithelialized no clinical signs of infection noted.  Orthopedic: Normal joint ROM without pain or crepitus bilaterally. No visible deformities. No bony tenderness.   Radiographs: None Assessment:   1. Diabetes mellitus without complication (HCC)   2. Friction  blister of the foot, left, initial encounter    Plan:  Patient was evaluated and treated and all questions answered.  Left distal hallux friction blister -Clinically healed.  No further blister formation noted.  I have continue to return back to regular activities.  I also encouraged her to maintain her sugars given that she is diabetic and any type of blister formation can easily turn into wound.  Patient states understanding. -If any foot and ankle issues arise in the future I have asked her to come back and see me.  She states understanding No follow-ups on file.

## 2020-05-20 ENCOUNTER — Other Ambulatory Visit: Payer: Self-pay | Admitting: Family Medicine

## 2020-05-20 ENCOUNTER — Telehealth: Payer: Self-pay

## 2020-05-20 NOTE — Telephone Encounter (Signed)
Submitted VOB for Gel-One, left knee. 

## 2020-05-24 ENCOUNTER — Other Ambulatory Visit: Payer: Self-pay | Admitting: Family Medicine

## 2020-05-25 ENCOUNTER — Telehealth: Payer: Self-pay

## 2020-05-25 NOTE — Telephone Encounter (Signed)
Called and left a VM advising patient to call back and schedule an appointment with Dr. August Saucer.  Approved, Gel-One, left knee. Buy & Bill Patient is covered through her insurance  Co-pay of $10.00 [place] Approval# [id number] Valid 05/21/2020- 08/18/2020

## 2020-06-05 ENCOUNTER — Other Ambulatory Visit: Payer: Self-pay

## 2020-06-05 ENCOUNTER — Ambulatory Visit (INDEPENDENT_AMBULATORY_CARE_PROVIDER_SITE_OTHER): Payer: Medicare HMO | Admitting: Podiatry

## 2020-06-05 DIAGNOSIS — E119 Type 2 diabetes mellitus without complications: Secondary | ICD-10-CM | POA: Diagnosis not present

## 2020-06-05 DIAGNOSIS — M79675 Pain in left toe(s): Secondary | ICD-10-CM | POA: Diagnosis not present

## 2020-06-05 DIAGNOSIS — M79674 Pain in right toe(s): Secondary | ICD-10-CM

## 2020-06-05 DIAGNOSIS — B351 Tinea unguium: Secondary | ICD-10-CM | POA: Diagnosis not present

## 2020-06-14 ENCOUNTER — Encounter: Payer: Self-pay | Admitting: Podiatry

## 2020-06-14 NOTE — Progress Notes (Signed)
  Subjective:  Patient ID: Abigail Mcpherson, female    DOB: December 19, 1932,  MRN: [id number]  Chief Complaint  Patient presents with  . Nail Problem    Nail care    87 y.o. female returns for the above complaint.  Patient presents with thickened elongated dystrophic toenails x10.  Patient would like to have them to be done as she is not able to do so.  They are mildly painful to touch.  Painful when walking.  She is a type II diabetic with last A1c of 7.1.  She denies any other acute complaints.  Objective:  There were no vitals filed for this visit. Podiatric Exam: Vascular: dorsalis pedis and posterior tibial pulses are palpable bilateral. Capillary return is immediate. Temperature gradient is WNL. Skin turgor WNL  Sensorium: Diminished Semmes Weinstein monofilament test.  Diminished tactile sensation bilaterally. Nail Exam: Pt has thick disfigured discolored nails with subungual debris noted bilateral entire nail hallux through fifth toenails.  Pain on palpation to the nails. Ulcer Exam: There is no evidence of ulcer or pre-ulcerative changes or infection. Orthopedic Exam: Muscle tone and strength are WNL. No limitations in general ROM. No crepitus or effusions noted. HAV  B/L.  Hammer toes 2-5  B/L. Skin: No Porokeratosis. No infection or ulcers    Assessment & Plan:   No diagnosis found.  Patient was evaluated and treated and all questions answered.  Hammertoe contractures bilateral 2 through 5 -I explained to patient the etiology of contractures and various treatment options were discussed.  Given the patient is an uncontrolled diabetic with last A1c of 7.1 I believe she will benefit from diabetic shoes to evenly distribute the pressure and offload the contractures therefore preventing ulcerations in future amputations.  Patient agrees with the plan. -She has not received her diabetic shoes.  Onychomycosis with pain  -Nails palliatively debrided as below. -Educated on  self-care  Procedure: Nail Debridement Rationale: pain  Type of Debridement: manual, sharp debridement. Instrumentation: Nail nipper, rotary burr. Number of Nails: 10  Procedures and Treatment: Consent by patient was obtained for treatment procedures. The patient understood the discussion of treatment and procedures well. All questions were answered thoroughly reviewed. Debridement of mycotic and hypertrophic toenails, 1 through 5 bilateral and clearing of subungual debris. No ulceration, no infection noted.  Return Visit-Office Procedure: Patient instructed to return to the office for a follow up visit 3 months for continued evaluation and treatment.  Nicholes Rough, DPM    No follow-ups on file.

## 2020-06-22 ENCOUNTER — Ambulatory Visit (INDEPENDENT_AMBULATORY_CARE_PROVIDER_SITE_OTHER): Payer: Medicare HMO | Admitting: Podiatry

## 2020-06-22 ENCOUNTER — Other Ambulatory Visit: Payer: Self-pay

## 2020-06-22 DIAGNOSIS — M2042 Other hammer toe(s) (acquired), left foot: Secondary | ICD-10-CM

## 2020-06-22 DIAGNOSIS — M2041 Other hammer toe(s) (acquired), right foot: Secondary | ICD-10-CM

## 2020-06-22 DIAGNOSIS — E119 Type 2 diabetes mellitus without complications: Secondary | ICD-10-CM

## 2020-06-22 NOTE — Progress Notes (Signed)
Patient tried on the two sets of shoes that was ordered and the first pair fit ok but the left foot was too swollen and patient could not get the shoe on all the way.  Patient tried on the second pair and could not get the shoe on at all.  Both shoes was a 7 wide and the patient needed a 7 1/2 wide and Dawn has ordered another shoe.  Patient will be contracted when the shoes come in.

## 2020-07-09 ENCOUNTER — Telehealth: Payer: Self-pay

## 2020-07-09 DIAGNOSIS — M1711 Unilateral primary osteoarthritis, right knee: Secondary | ICD-10-CM

## 2020-07-09 DIAGNOSIS — M1712 Unilateral primary osteoarthritis, left knee: Secondary | ICD-10-CM

## 2020-07-09 NOTE — Telephone Encounter (Signed)
Please advise. Nothing mentioned in last OV note about PT

## 2020-07-09 NOTE — Telephone Encounter (Signed)
Abigail Mcpherson with [facility] would like a physical therapy referral faxed to [phone].  Cb# [phone].  Please advise.  Thank you.

## 2020-07-13 NOTE — Telephone Encounter (Signed)
Okay for one-time a week for 4 weeks thanks

## 2020-07-14 NOTE — Telephone Encounter (Signed)
faxed

## 2020-07-14 NOTE — Addendum Note (Signed)
Addended byPrescott Parma on: 07/14/2020 08:30 AM   Modules accepted: Orders

## 2020-07-20 ENCOUNTER — Telehealth: Payer: Self-pay

## 2020-07-20 NOTE — Telephone Encounter (Signed)
[facility] called stating that a referral for PT needs to be faxed to [phone].  CB# [phone].  Please advise.  Thank you.

## 2020-07-21 NOTE — Telephone Encounter (Signed)
Faxed again

## 2020-07-23 ENCOUNTER — Telehealth: Payer: Self-pay

## 2020-07-23 NOTE — Telephone Encounter (Signed)
Faxed dempgraphics to Johnathan with [facility] at [phone].

## 2020-07-29 ENCOUNTER — Encounter: Payer: Self-pay | Admitting: Family Medicine

## 2020-07-30 ENCOUNTER — Ambulatory Visit: Payer: Medicare HMO | Admitting: Orthopedic Surgery

## 2020-07-30 ENCOUNTER — Other Ambulatory Visit: Payer: Self-pay | Admitting: Family Medicine

## 2020-07-30 ENCOUNTER — Encounter: Payer: Self-pay | Admitting: Orthopedic Surgery

## 2020-07-30 ENCOUNTER — Telehealth: Payer: Self-pay

## 2020-07-30 DIAGNOSIS — M1712 Unilateral primary osteoarthritis, left knee: Secondary | ICD-10-CM

## 2020-07-30 MED ORDER — BENZONATATE 100 MG PO CAPS: 100.0000 mg | ORAL_CAPSULE | Freq: Three times a day (TID) | ORAL | 1 refills | Status: DC | PRN

## 2020-07-30 MED ORDER — AMOXICILLIN 500 MG PO CAPS: 500.0000 mg | ORAL_CAPSULE | Freq: Three times a day (TID) | ORAL | 0 refills | Status: DC

## 2020-07-30 NOTE — Telephone Encounter (Signed)
Noted  

## 2020-07-30 NOTE — Telephone Encounter (Signed)
Left knee gel injection ?

## 2020-07-30 NOTE — Progress Notes (Signed)
Office Visit Note   Patient: Abigail Mcpherson           Date of Birth: 02-10-1933           MRN: [id number] Visit Date: 07/30/2020 Requested by: [name], MD [address] [place],  [place] [address] PCP: [name], MD  Subjective: Chief Complaint  Patient presents with  . Left Knee - Follow-up    HPI: Abigail Mcpherson is a 87 y.o. female who presents to the office complaining of left knee pain.  She has known history of left knee arthritis.  Last injection was 04/22/2020 and provided about 3 months of relief.  She is not waking with pain at night.  She takes Tylenol at night for pain control.  Last A1c is 6.4 by her history..                ROS: All systems reviewed are negative as they relate to the chief complaint within the history of present illness.  Patient denies fevers or chills.  Assessment & Plan: Visit Diagnoses: No diagnosis found.  Plan: Patient is a 87 year old female who presents for repeat left knee injection.  Last injection was 04/22/2018 to provide 3 months of relief.  She is here today for cortisone injection.  Tolerated procedure well.  She does report that the gel injections are more helpful so plan to preapproved patient for gel injections and follow-up in 3 months.  If gel injections are approved tried the gel at that time, if not proceed with cortisone.  Patient agreed with this plan.  This patient is diagnosed with osteoarthritis of the knee(s).    Radiographs show evidence of joint space narrowing, osteophytes, subchondral sclerosis and/or subchondral cysts.  This patient has knee pain which interferes with functional and activities of daily living.    This patient has experienced inadequate response, adverse effects and/or intolerance with conservative treatments such as acetaminophen, NSAIDS, topical creams, physical therapy or regular exercise, knee bracing and/or weight loss.   This patient has experienced inadequate response or  has a contraindication to intra articular steroid injections for at least 3 months.   This patient is not scheduled to have a total knee replacement within 6 months of starting treatment with viscosupplementation.   Follow-Up Instructions: No follow-ups on file.   Orders:  No orders of the defined types were placed in this encounter.  No orders of the defined types were placed in this encounter.     Procedures: Large Joint Inj: L knee on 07/30/2020 11:28 AM Indications: diagnostic evaluation, joint swelling and pain Details: 18 G 1.5 in needle, superolateral approach  Arthrogram: No  Medications: 5 mL lidocaine 1 %; 4 mL bupivacaine 0.25 %; 6 mg betamethasone acetate-betamethasone sodium phosphate 6 (3-3) MG/ML Outcome: tolerated well, no immediate complications Procedure, treatment alternatives, risks and benefits explained, specific risks discussed. Consent was given by the patient. Immediately prior to procedure a time out was called to verify the correct patient, procedure, equipment, support staff and site/side marked as required. Patient was prepped and draped in the usual sterile fashion.       Clinical Data: No additional findings.  Objective: Vital Signs: There were no vitals taken for this visit.  Physical Exam:  Constitutional: Patient appears well-developed HEENT:  Head: Normocephalic Eyes:EOM are normal Neck: Normal range of motion Cardiovascular: Normal rate Pulmonary/chest: Effort normal Neurologic: Patient is alert Skin: Skin is warm Psychiatric: Patient has normal mood and affect  Ortho Exam: Ortho exam demonstrates left knee with no effusion.  10 degree flexion contracture with 95 degrees of flexion.  No calf tenderness.  Negative Homans' sign.  No pain with hip range of motion.  Tenderness over the medial joint line but no tenderness over the lateral joint line.  Specialty Comments:  No specialty comments available.  Imaging: No results  found.   PMFS History: Patient Active Problem List   Diagnosis Date Noted  . Glaucoma 12/09/2019  . Reflux esophagitis 12/09/2019  . Pedal edema 08/21/2018  . Pharyngitis 05/29/2018  . Urinary incontinence 05/29/2018  . Knee pain, bilateral 05/29/2018  . Allergy   . History of shingles   . Diabetes mellitus without complication (HCC)   . Hyperlipidemia   . Hypertension   . Cataract    Past Medical History:  Diagnosis Date  . Allergy   . Cataract   . Diabetes mellitus without complication (HCC)   . GERD (gastroesophageal reflux disease)   . History of shingles   . Hyperlipidemia   . Hypertension   . Sleep apnea    cannot tolerate CPAP    Family History  Problem Relation Age of Onset  . Diabetes Mother   . Hypertension Mother   . Hernia Mother        inguinal  . Diabetes Father   . Stroke Son   . Aneurysm Paternal Aunt        brain  . Cancer Sister        intestinal  . Hypertension Sister   . Diabetes Sister   . Glaucoma Sister   . Diabetes Sister   . Hypertension Sister   . Hyperlipidemia Sister   . Diabetes Sister   . Hypertension Sister   . Hyperlipidemia Sister   . Hernia Sister        umbilical  . Hyperlipidemia Sister   . Hypertension Sister   . Diabetes Sister   . Alcohol abuse Sister   . Diabetes Sister   . Hyperlipidemia Sister   . Hypertension Sister   . Obesity Sister   . Stomach cancer Neg Hx   . Colon cancer Neg Hx   . Esophageal cancer Neg Hx   . Colon polyps Neg Hx     Past Surgical History:  Procedure Laterality Date  . APPENDECTOMY    . BREAST SURGERY     breast biopsy left , b/l lumpectomy 1969  . BUNIONECTOMY Bilateral   . CESAREAN SECTION     Social History   Occupational History  . Occupation: Nurse    Comment: retired  Tobacco Use  . Smoking status: Former Smoker    Types: Cigarettes  . Smokeless tobacco: Never Used  Vaping Use  . Vaping Use: Never used  Substance and Sexual Activity  . Alcohol use: Yes     Comment: occ  . Drug use: Never  . Sexual activity: Not Currently

## 2020-07-31 MED ORDER — BUPIVACAINE HCL 0.25 % IJ SOLN
4.0000 mL | INTRAMUSCULAR | Status: AC | PRN
  Administered 2020-07-30: 4 mL via INTRA_ARTICULAR

## 2020-07-31 MED ORDER — BETAMETHASONE SOD PHOS & ACET 6 (3-3) MG/ML IJ SUSP
6.0000 mg | INTRAMUSCULAR | Status: AC | PRN
  Administered 2020-07-30: 6 mg via INTRA_ARTICULAR

## 2020-07-31 MED ORDER — LIDOCAINE HCL 1 % IJ SOLN
5.0000 mL | INTRAMUSCULAR | Status: AC | PRN
  Administered 2020-07-30: 5 mL

## 2020-08-04 NOTE — Telephone Encounter (Signed)
Called patient and she just decided to make an ov and to just get labs the week before. Appointments scheduled.

## 2020-08-04 NOTE — Addendum Note (Signed)
Addended by: Thelma Barge D on: 08/04/2020 11:58 AM   Modules accepted: Orders

## 2020-08-05 ENCOUNTER — Other Ambulatory Visit: Payer: Self-pay | Admitting: Family Medicine

## 2020-08-05 DIAGNOSIS — Z1239 Encounter for other screening for malignant neoplasm of breast: Secondary | ICD-10-CM

## 2020-08-13 ENCOUNTER — Other Ambulatory Visit: Payer: Self-pay | Admitting: Family Medicine

## 2020-09-02 ENCOUNTER — Other Ambulatory Visit: Payer: Self-pay

## 2020-09-02 ENCOUNTER — Ambulatory Visit: Payer: Medicare HMO | Admitting: Podiatry

## 2020-09-02 DIAGNOSIS — M79675 Pain in left toe(s): Secondary | ICD-10-CM | POA: Diagnosis not present

## 2020-09-02 DIAGNOSIS — E119 Type 2 diabetes mellitus without complications: Secondary | ICD-10-CM

## 2020-09-02 DIAGNOSIS — M79674 Pain in right toe(s): Secondary | ICD-10-CM

## 2020-09-02 DIAGNOSIS — B351 Tinea unguium: Secondary | ICD-10-CM | POA: Diagnosis not present

## 2020-09-03 ENCOUNTER — Encounter: Payer: Self-pay | Admitting: Podiatry

## 2020-09-03 NOTE — Progress Notes (Signed)
  Subjective:  Patient ID: Abigail Mcpherson, female    DOB: Aug 01, 1932,  MRN: [id number]  Chief Complaint  Patient presents with  . Diabetes    Diabetic foot care    87 y.o. female returns for the above complaint.  Patient presents with thickened elongated dystrophic toenails x10.  Patient would like to have them to be done as she is not able to do so.  They are mildly painful to touch.  Painful when walking.  She is a type II diabetic with last A1c of 7.1.  She denies any other acute complaints.  Objective:  There were no vitals filed for this visit. Podiatric Exam: Vascular: dorsalis pedis and posterior tibial pulses are palpable bilateral. Capillary return is immediate. Temperature gradient is WNL. Skin turgor WNL  Sensorium: Diminished Semmes Weinstein monofilament test.  Diminished tactile sensation bilaterally. Nail Exam: Pt has thick disfigured discolored nails with subungual debris noted bilateral entire nail hallux through fifth toenails.  Pain on palpation to the nails. Ulcer Exam: There is no evidence of ulcer or pre-ulcerative changes or infection. Orthopedic Exam: Muscle tone and strength are WNL. No limitations in general ROM. No crepitus or effusions noted. HAV  B/L.  Hammer toes 2-5  B/L. Skin: No Porokeratosis. No infection or ulcers    Assessment & Plan:   1. Diabetes mellitus without complication (HCC)   2. Pain due to onychomycosis of toenails of both feet     Patient was evaluated and treated and all questions answered.  Hammertoe contractures bilateral 2 through 5 -I explained to patient the etiology of contractures and various treatment options were discussed.  Given the patient is an uncontrolled diabetic with last A1c of 7.1 I believe she will benefit from diabetic shoes to evenly distribute the pressure and offload the contractures therefore preventing ulcerations in future amputations.  Patient agrees with the plan. -She has not received her diabetic  shoes.  Onychomycosis with pain  -Nails palliatively debrided as below. -Educated on self-care  Procedure: Nail Debridement Rationale: pain  Type of Debridement: manual, sharp debridement. Instrumentation: Nail nipper, rotary burr. Number of Nails: 10  Procedures and Treatment: Consent by patient was obtained for treatment procedures. The patient understood the discussion of treatment and procedures well. All questions were answered thoroughly reviewed. Debridement of mycotic and hypertrophic toenails, 1 through 5 bilateral and clearing of subungual debris. No ulceration, no infection noted.  Return Visit-Office Procedure: Patient instructed to return to the office for a follow up visit 3 months for continued evaluation and treatment.  Nicholes Rough, DPM    No follow-ups on file.

## 2020-09-04 ENCOUNTER — Ambulatory Visit: Payer: Medicare HMO | Admitting: Podiatry

## 2020-09-16 ENCOUNTER — Telehealth: Payer: Self-pay

## 2020-09-16 ENCOUNTER — Encounter: Payer: Self-pay | Admitting: Family Medicine

## 2020-09-16 NOTE — Telephone Encounter (Signed)
Approved, Gel-One, left knee. Buy & Bill Patient is covered through her insurance        Co-pay of $10.00 [place] Approval# [id number] Valid 09/16/2020- 12/17/2020

## 2020-09-16 NOTE — Telephone Encounter (Signed)
Tried calling patient to schedule appointment for gel injection, but no answer and VM is full and was not able to leave a message.

## 2020-10-30 ENCOUNTER — Ambulatory Visit: Payer: Medicare HMO | Admitting: Orthopedic Surgery

## 2020-11-05 ENCOUNTER — Other Ambulatory Visit (INDEPENDENT_AMBULATORY_CARE_PROVIDER_SITE_OTHER): Payer: Medicare HMO

## 2020-11-05 DIAGNOSIS — N289 Disorder of kidney and ureter, unspecified: Secondary | ICD-10-CM

## 2020-11-05 DIAGNOSIS — E785 Hyperlipidemia, unspecified: Secondary | ICD-10-CM

## 2020-11-05 DIAGNOSIS — I10 Essential (primary) hypertension: Secondary | ICD-10-CM

## 2020-11-05 DIAGNOSIS — E119 Type 2 diabetes mellitus without complications: Secondary | ICD-10-CM | POA: Diagnosis not present

## 2020-11-05 LAB — COMPREHENSIVE METABOLIC PANEL
ALT: 10 U/L (ref 0–35)
AST: 15 U/L (ref 0–37)
Albumin: 4.2 g/dL (ref 3.5–5.2)
Alkaline Phosphatase: 92 U/L (ref 39–117)
BUN: 13 mg/dL (ref 6–23)
CO2: 26 mEq/L (ref 19–32)
Calcium: 9.5 mg/dL (ref 8.4–10.5)
Chloride: 105 mEq/L (ref 96–112)
Creatinine, Ser: 0.81 mg/dL (ref 0.40–1.20)
GFR: 65.95 mL/min (ref >60.00)
Glucose, Bld: 127 mg/dL — ABNORMAL HIGH (ref 70–99)
Potassium: 3.9 mEq/L (ref 3.5–5.1)
Sodium: 141 mEq/L (ref 135–145)
Total Bilirubin: 0.4 mg/dL (ref 0.2–1.2)
Total Protein: 7 g/dL (ref 6.0–8.3)

## 2020-11-05 LAB — CBC WITH DIFFERENTIAL/PLATELET
Basophils Absolute: 0 10*3/uL (ref 0.0–0.1)
Basophils Relative: 0.1 % (ref 0.0–3.0)
Eosinophils Absolute: 0 10*3/uL (ref 0.0–0.7)
Eosinophils Relative: 0.5 % (ref 0.0–5.0)
HCT: 38.3 % (ref 36.0–46.0)
Hemoglobin: 12.5 g/dL (ref 12.0–15.0)
Lymphocytes Relative: 28.7 % (ref 12.0–46.0)
Lymphs Abs: 1.9 10*3/uL (ref 0.7–4.0)
MCHC: 32.7 g/dL (ref 30.0–36.0)
MCV: 87.6 fl (ref 78.0–100.0)
Monocytes Absolute: 0.4 10*3/uL (ref 0.1–1.0)
Monocytes Relative: 6.6 % (ref 3.0–12.0)
Neutro Abs: 4.2 10*3/uL (ref 1.4–7.7)
Neutrophils Relative %: 64.1 % (ref 43.0–77.0)
Platelets: 229 10*3/uL (ref 150.0–400.0)
RBC: 4.37 Mil/uL (ref 3.87–5.11)
RDW: 13.8 % (ref 11.5–15.5)
WBC: 6.5 10*3/uL (ref 4.0–10.5)

## 2020-11-05 LAB — LIPID PANEL
Cholesterol: 240 mg/dL — ABNORMAL HIGH (ref 0–200)
HDL: 60.3 mg/dL (ref >39.00)
LDL Cholesterol: 147 mg/dL — ABNORMAL HIGH (ref 0–99)
NonHDL: 180.01
Total CHOL/HDL Ratio: 4
Triglycerides: 163 mg/dL — ABNORMAL HIGH (ref 0.0–149.0)
VLDL: 32.6 mg/dL (ref 0.0–40.0)

## 2020-11-05 LAB — HEMOGLOBIN A1C: Hgb A1c MFr Bld: 6.6 % — ABNORMAL HIGH (ref 4.6–6.5)

## 2020-11-05 LAB — TSH: TSH: 1.04 u[IU]/mL (ref 0.35–5.50)

## 2020-11-05 NOTE — Addendum Note (Signed)
Addended by: Elita Boone E on: 11/05/2020 10:51 AM   Modules accepted: Orders

## 2020-11-08 ENCOUNTER — Other Ambulatory Visit: Payer: Self-pay | Admitting: Family Medicine

## 2020-11-10 ENCOUNTER — Telehealth (INDEPENDENT_AMBULATORY_CARE_PROVIDER_SITE_OTHER): Payer: Medicare HMO | Admitting: Family Medicine

## 2020-11-10 ENCOUNTER — Other Ambulatory Visit: Payer: Self-pay

## 2020-11-10 ENCOUNTER — Telehealth: Payer: Self-pay | Admitting: Podiatry

## 2020-11-10 DIAGNOSIS — E119 Type 2 diabetes mellitus without complications: Secondary | ICD-10-CM

## 2020-11-10 DIAGNOSIS — G4700 Insomnia, unspecified: Secondary | ICD-10-CM

## 2020-11-10 DIAGNOSIS — E785 Hyperlipidemia, unspecified: Secondary | ICD-10-CM | POA: Diagnosis not present

## 2020-11-10 DIAGNOSIS — I10 Essential (primary) hypertension: Secondary | ICD-10-CM | POA: Diagnosis not present

## 2020-11-10 DIAGNOSIS — M79672 Pain in left foot: Secondary | ICD-10-CM

## 2020-11-10 DIAGNOSIS — N3281 Overactive bladder: Secondary | ICD-10-CM

## 2020-11-10 DIAGNOSIS — R202 Paresthesia of skin: Secondary | ICD-10-CM

## 2020-11-10 DIAGNOSIS — R059 Cough, unspecified: Secondary | ICD-10-CM | POA: Diagnosis not present

## 2020-11-10 DIAGNOSIS — R32 Unspecified urinary incontinence: Secondary | ICD-10-CM

## 2020-11-10 MED ORDER — NYSTATIN 100000 UNIT/GM EX CREA: 1.0000 "application " | TOPICAL_CREAM | Freq: Two times a day (BID) | CUTANEOUS | 5 refills | Status: DC

## 2020-11-10 MED ORDER — ROSUVASTATIN CALCIUM 10 MG PO TABS: 10.0000 mg | ORAL_TABLET | Freq: Every day | ORAL | 1 refills | Status: DC

## 2020-11-10 MED ORDER — OMEPRAZOLE 40 MG PO CPDR: 40.0000 mg | DELAYED_RELEASE_CAPSULE | Freq: Two times a day (BID) | ORAL | 1 refills | Status: DC

## 2020-11-10 MED ORDER — OXYBUTYNIN CHLORIDE ER 10 MG PO TB24: 10.0000 mg | ORAL_TABLET | Freq: Every day | ORAL | 2 refills | Status: DC

## 2020-11-10 NOTE — Patient Instructions (Addendum)
Melatonin 2-10 mg  L Tryptophan capsules Magnesium Glycinate 200-400 mg    Unisom   Paxlovid or Molnupiravir are the new COVID medications we can give you if you get COVID so make sure you test if you have symptoms because we have to treat by day 5 of symptoms for it to be effective. If you are positive let us know so we can treat. If a home test is negative and your symptoms are persistent get a PCR test. Can check testing locations at [email or web address] If you are positive we will make an appointment with [place] and we will send in Paxlovid if you would like it. Check with your pharmacy before we meet to confirm they have it in stock, if they do not then we can get the prescription at the [facility]   Insomnia Insomnia is a sleep disorder that makes it difficult to fall asleep or stay asleep. Insomnia can cause fatigue, low energy, difficulty concentrating, moodswings, and poor performance at work or school. There are three different ways to classify insomnia: Difficulty falling asleep. Difficulty staying asleep. Waking up too early in the morning. Any type of insomnia can be long-term (chronic) or short-term (acute). Both are common. Short-term insomnia usually lasts for three months or less. Chronic insomnia occurs at least three times a week for longer than threemonths. What are the causes? Insomnia may be caused by another condition, situation, or substance, such as: Anxiety. Certain medicines. Gastroesophageal reflux disease (GERD) or other gastrointestinal conditions. Asthma or other breathing conditions. Restless legs syndrome, sleep apnea, or other sleep disorders. Chronic pain. Menopause. Stroke. Abuse of alcohol, tobacco, or illegal drugs. Mental health conditions, such as depression. Caffeine. Neurological disorders, such as Alzheimer's disease. An overactive thyroid (hyperthyroidism). Sometimes, the cause of insomnia may not be known. What increases the  risk? Risk factors for insomnia include: Gender. Women are affected more often than men. Age. Insomnia is more common as you get older. Stress. Lack of exercise. Irregular work schedule or working night shifts. Traveling between different time zones. Certain medical and mental health conditions. What are the signs or symptoms? If you have insomnia, the main symptom is having trouble falling asleep or having trouble staying asleep. This may lead to other symptoms, such as: Feeling fatigued or having low energy. Feeling nervous about going to sleep. Not feeling rested in the morning. Having trouble concentrating. Feeling irritable, anxious, or depressed. How is this diagnosed? This condition may be diagnosed based on: Your symptoms and medical history. Your health care provider may ask about: Your sleep habits. Any medical conditions you have. Your mental health. A physical exam. How is this treated? Treatment for insomnia depends on the cause. Treatment may focus on treating an underlying condition that is causing insomnia. Treatment may also include: Medicines to help you sleep. Counseling or therapy. Lifestyle adjustments to help you sleep better. Follow these instructions at home: Eating and drinking  Limit or avoid alcohol, caffeinated beverages, and cigarettes, especially close to bedtime. These can disrupt your sleep. Do not eat a large meal or eat spicy foods right before bedtime. This can lead to digestive discomfort that can make it hard for you to sleep.  Sleep habits  Keep a sleep diary to help you and your health care provider figure out what could be causing your insomnia. Write down: When you sleep. When you wake up during the night. How well you sleep. How rested you feel the next day. Any side effects of  medicines you are taking. What you eat and drink. Make your bedroom a dark, comfortable place where it is easy to fall asleep. Put up shades or blackout  curtains to block light from outside. Use a white noise machine to block noise. Keep the temperature cool. Limit screen use before bedtime. This includes: Watching TV. Using your smartphone, tablet, or computer. Stick to a routine that includes going to bed and waking up at the same times every day and night. This can help you fall asleep faster. Consider making a quiet activity, such as reading, part of your nighttime routine. Try to avoid taking naps during the day so that you sleep better at night. Get out of bed if you are still awake after 15 minutes of trying to sleep. Keep the lights down, but try reading or doing a quiet activity. When you feel sleepy, go back to bed.  General instructions Take over-the-counter and prescription medicines only as told by your health care provider. Exercise regularly, as told by your health care provider. Avoid exercise starting several hours before bedtime. Use relaxation techniques to manage stress. Ask your health care provider to suggest some techniques that may work well for you. These may include: Breathing exercises. Routines to release muscle tension. Visualizing peaceful scenes. Make sure that you drive carefully. Avoid driving if you feel very sleepy. Keep all follow-up visits as told by your health care provider. This is important. Contact a health care provider if: You are tired throughout the day. You have trouble in your daily routine due to sleepiness. You continue to have sleep problems, or your sleep problems get worse. Get help right away if: You have serious thoughts about hurting yourself or someone else. If you ever feel like you may hurt yourself or others, or have thoughts about taking your own life, get help right away. You can go to your nearest emergency department or call: Your local emergency services (911 in the [place] A suicide crisis helpline, such as the National Suicide Prevention Lifeline at 412-525-4640. This is  open 24 hours a day. Summary Insomnia is a sleep disorder that makes it difficult to fall asleep or stay asleep. Insomnia can be long-term (chronic) or short-term (acute). Treatment for insomnia depends on the cause. Treatment may focus on treating an underlying condition that is causing insomnia. Keep a sleep diary to help you and your health care provider figure out what could be causing your insomnia. This information is not intended to replace advice given to you by your health care provider. Make sure you discuss any questions you have with your healthcare provider. Document Revised: 02/13/2020 Document Reviewed: 02/13/2020 Elsevier Patient Education  2022 [employer].

## 2020-11-10 NOTE — Telephone Encounter (Signed)
Received voicemail from [facility] @ [facility] Mariel Aloe office asking if we could fax over diabetic shoe paperwork for pt.   The fax # is [phone] and phone #  is [phone] if any questions.  I have faxed documents to them.

## 2020-11-10 NOTE — Assessment & Plan Note (Signed)
Cholesterol is up, increase the Rosuvastatin to 10 mg qhs. Encourage heart healthy diet such as MIND or DASH diet, increase exercise, avoid trans fats, simple carbohydrates and processed foods, consider a krill or fish or flaxseed oil cap daily.

## 2020-11-10 NOTE — Assessment & Plan Note (Signed)
Well controlled, no changes to meds. Encouraged heart healthy diet such as the DASH diet and exercise as tolerated.  °

## 2020-11-10 NOTE — Assessment & Plan Note (Signed)
hgba1c acceptable, minimize simple carbs. Increase exercise as tolerated. Continue current meds 

## 2020-11-10 NOTE — Progress Notes (Addendum)
MyChart Video Visit    Virtual Visit via Video Note   This visit type was conducted due to national recommendations for restrictions regarding the COVID-19 Pandemic (e.g. social distancing) in an effort to limit this patient's exposure and mitigate transmission in our community. This patient is at least at moderate risk for complications without adequate follow up. This format is felt to be most appropriate for this patient at this time. Physical exam was limited by quality of the video and audio technology used for the visit. S. Chism was able to get the patient set up on a video visit.  Patient location: Home Patient and provider in visit Provider location: Office  I discussed the limitations of evaluation and management by telemedicine and the availability of in person appointments. The patient expressed understanding and agreed to proceed.  Visit Date: 11/10/20  Today's healthcare provider: Danise Edge, MD     Subjective:    Patient ID: Abigail Mcpherson, female    DOB: Sep 05, 1932, 87 y.o.   MRN: [id number]  Chief Complaint  Patient presents with   Follow-up   Hyperlipidemia    HPI Patient is in today for follow up on chronic medical concerns including DM and HTN. No recent febrile illness or hospitalizations. No complaints of polydipsia but she continues to note urinary incontinence. No dysuria or hematuria. She notes numbness in 2nd and 3rd fingers of left hand intermittently. Notes pain in left foot, fatigue and insomnia. Denies CP/palp/SOB/Mcpherson/congestion/fevers/GI or GU c/o. Taking meds as prescribed   Past Medical History:  Diagnosis Date   Allergy    Cataract    Diabetes mellitus without complication (HCC)    GERD (gastroesophageal reflux disease)    History of shingles    Hyperlipidemia    Hypertension    Sleep apnea    cannot tolerate CPAP    Past Surgical History:  Procedure Laterality Date   APPENDECTOMY     BREAST SURGERY     breast biopsy left , b/l  lumpectomy 1969   BUNIONECTOMY Bilateral    CESAREAN SECTION      Family History  Problem Relation Age of Onset   Diabetes Mother    Hypertension Mother    Hernia Mother        inguinal   Diabetes Father    Stroke Son    Aneurysm Paternal Aunt        brain   Cancer Sister        intestinal   Hypertension Sister    Diabetes Sister    Glaucoma Sister    Diabetes Sister    Hypertension Sister    Hyperlipidemia Sister    Diabetes Sister    Hypertension Sister    Hyperlipidemia Sister    Hernia Sister        umbilical   Hyperlipidemia Sister    Hypertension Sister    Diabetes Sister    Alcohol abuse Sister    Diabetes Sister    Hyperlipidemia Sister    Hypertension Sister    Obesity Sister    Stomach cancer Neg Hx    Colon cancer Neg Hx    Esophageal cancer Neg Hx    Colon polyps Neg Hx     Social History   Socioeconomic History   Marital status: Divorced    Spouse name: Not on file   Number of children: Not on file   Years of education: Not on file   Highest education level: Not on file  Occupational History  Occupation: Engineer, civil (consulting)    Comment: retired  Tobacco Use   Smoking status: Former    Types: Cigarettes   Smokeless tobacco: Never  Building services engineer Use: Never used  Substance and Sexual Activity   Alcohol use: Yes    Comment: occ   Drug use: Never   Sexual activity: Not Currently  Other Topics Concern   Not on file  Social History Narrative   Avoids pork and shellfish and deep sea fish. Lives with family, daughter, wears seat belt always. Divorced, retired Engineer, civil (consulting)   Social Determinants of Corporate investment banker Strain: Not on [employer] Insecurity: Not on file  Transportation Needs: Not on file  Physical Activity: Not on file  Stress: Not on file  Social Connections: Not on file  Intimate Partner Violence: Not on file    Outpatient Medications Prior to Visit  Medication Sig Dispense Refill   albuterol (VENTOLIN HFA) 108 (90 Base)  MCG/ACT inhaler USE 2 INHALATIONS BY MOUTH  EVERY 6 HOURS AS NEEDED FOR WHEEZING OR SHORTNESS OF  BREATH 54 g 1   amLODipine (NORVASC) 2.5 MG tablet TAKE 1 TABLET BY MOUTH  DAILY 90 tablet 3   benzonatate (TESSALON) 100 MG capsule TAKE 1 CAPSULE(100 MG) BY MOUTH THREE TIMES DAILY AS NEEDED FOR COUGH 30 capsule 1   cetirizine (ZYRTEC) 10 MG tablet Take 1 tablet (10 mg total) by mouth daily. 30 tablet 11   famotidine (PEPCID) 40 MG tablet Take 1 tablet (40 mg total) by mouth at bedtime. 90 tablet 3   furosemide (LASIX) 20 MG tablet TAKE 1 TABLET BY MOUTH  DAILY AND A SECOND TABLET  DAILY AS NEEDED FOR PEDAL  EDEMA OR WEIGHT GAIN OF 3  LB IN 24 HOURS 180 tablet 3   hydrochlorothiazide (MICROZIDE) 12.5 MG capsule TAKE 1 CAPSULE BY MOUTH  DAILY 90 capsule 3   metFORMIN (GLUCOPHAGE-XR) 500 MG 24 hr tablet TAKE 2 TABLETS BY MOUTH IN  THE MORNING AND 1 TABLET IN THE EVENING 270 tablet 3   montelukast (SINGULAIR) 10 MG tablet TAKE 1 TABLET BY MOUTH AT  BEDTIME AS NEEDED FOR  ALLERGIES 90 tablet 3   potassium chloride SA (KLOR-CON) 20 MEQ tablet TAKE 1 TABLET BY MOUTH  DAILY 90 tablet 3   tiZANidine (ZANAFLEX) 2 MG tablet Take 0.5-1 tablets (1-2 mg total) by mouth at bedtime as needed for muscle spasms. 30 tablet 1   rosuvastatin (CRESTOR) 5 MG tablet TAKE 1 TABLET BY MOUTH  DAILY 90 tablet 3   aspirin 81 MG tablet Take 81 mg by mouth daily. Daily OTC (Patient not taking: Reported on 11/10/2020)     amoxicillin (AMOXIL) 500 MG capsule Take 1 capsule (500 mg total) by mouth 3 (three) times daily. 30 capsule 0   HYDROcodone-homatropine (HYCODAN) 5-1.5 MG/5ML syrup Take 5 mLs by mouth 2 (two) times daily as needed for cough. 150 mL 0   omeprazole (PRILOSEC) 40 MG capsule Take 1 capsule (40 mg total) by mouth daily. 90 capsule 3   No facility-administered medications prior to visit.    Allergies  Allergen Reactions   Iodine Hives   Shellfish Allergy Rash    Vomiting  Deep sea fish    Codeine      tinnitus   Demerol [Meperidine Hcl]     hyperactivity    Review of Systems  Constitutional:  Positive for malaise/fatigue. Negative for fever.  HENT:  Positive for congestion.   Eyes:  Negative for  blurred vision.  Respiratory:  Negative for shortness of breath.   Cardiovascular:  Negative for chest pain, palpitations and leg swelling.  Gastrointestinal:  Negative for abdominal pain, blood in stool and nausea.  Genitourinary:  Negative for dysuria and frequency.  Musculoskeletal:  Positive for joint pain. Negative for falls.  Skin:  Negative for rash.  Neurological:  Negative for dizziness, loss of consciousness and headaches.  Endo/Heme/Allergies:  Negative for environmental allergies.  Psychiatric/Behavioral:  Negative for depression. The patient has insomnia. The patient is not nervous/anxious.       Objective:    Physical Exam Constitutional:      General: She is not in acute distress.    Appearance: Normal appearance. She is not ill-appearing or toxic-appearing.  HENT:     Head: Normocephalic and atraumatic.     Right Ear: External ear normal.     Left Ear: External ear normal.     Nose: Nose normal.  Eyes:     General:        Right eye: No discharge.        Left eye: No discharge.  Pulmonary:     Effort: Pulmonary effort is normal.  Skin:    Findings: No rash.  Neurological:     Mental Status: She is alert and oriented to person, place, and time.  Psychiatric:        Behavior: Behavior normal.    There were no vitals taken for this visit. Wt Readings from Last 3 Encounters:  02/17/20 167 lb (75.8 kg)  01/20/20 167 lb (75.8 kg)  06/10/19 162 lb (73.5 kg)    Diabetic Foot Exam - Simple   No data filed    Lab Results  Component Value Date   WBC 6.5 11/05/2020   HGB 12.5 11/05/2020   HCT 38.3 11/05/2020   PLT 229.0 11/05/2020   GLUCOSE 127 (H) 11/05/2020   CHOL 240 (H) 11/05/2020   TRIG 163.0 (H) 11/05/2020   HDL 60.30 11/05/2020   LDLCALC 147 (H)  11/05/2020   ALT 10 11/05/2020   AST 15 11/05/2020   NA 141 11/05/2020   K 3.9 11/05/2020   CL 105 11/05/2020   CREATININE 0.81 11/05/2020   BUN 13 11/05/2020   CO2 26 11/05/2020   TSH 1.04 11/05/2020   HGBA1C 6.6 (H) 11/05/2020    Lab Results  Component Value Date   TSH 1.04 11/05/2020   Lab Results  Component Value Date   WBC 6.5 11/05/2020   HGB 12.5 11/05/2020   HCT 38.3 11/05/2020   MCV 87.6 11/05/2020   PLT 229.0 11/05/2020   Lab Results  Component Value Date   NA 141 11/05/2020   K 3.9 11/05/2020   CO2 26 11/05/2020   GLUCOSE 127 (H) 11/05/2020   BUN 13 11/05/2020   CREATININE 0.81 11/05/2020   BILITOT 0.4 11/05/2020   ALKPHOS 92 11/05/2020   AST 15 11/05/2020   ALT 10 11/05/2020   PROT 7.0 11/05/2020   ALBUMIN 4.2 11/05/2020   CALCIUM 9.5 11/05/2020   GFR 65.95 11/05/2020   Lab Results  Component Value Date   CHOL 240 (H) 11/05/2020   Lab Results  Component Value Date   HDL 60.30 11/05/2020   Lab Results  Component Value Date   LDLCALC 147 (H) 11/05/2020   Lab Results  Component Value Date   TRIG 163.0 (H) 11/05/2020   Lab Results  Component Value Date   CHOLHDL 4 11/05/2020   Lab Results  Component Value  Date   HGBA1C 6.6 (H) 11/05/2020       Assessment & Plan:   Problem List Items Addressed This Visit     Urinary incontinence    Will try with Oxybutynin xl 10 mg daily and consider urology referral if need be       Relevant Medications   oxybutynin (DITROPAN XL) 10 MG 24 hr tablet   Diabetes mellitus without complication (HCC)    hgba1c acceptable, minimize simple carbs. Increase exercise as tolerated. Continue current meds       Relevant Medications   rosuvastatin (CRESTOR) 10 MG tablet   Hyperlipidemia    Cholesterol is up, increase the Rosuvastatin to 10 mg qhs. Encourage heart healthy diet such as MIND or DASH diet, increase exercise, avoid trans fats, simple carbohydrates and processed foods, consider a krill or fish  or flaxseed oil cap daily.        Relevant Medications   rosuvastatin (CRESTOR) 10 MG tablet   Hypertension    Well controlled, no changes to meds. Encouraged heart healthy diet such as the DASH diet and exercise as tolerated.        Relevant Medications   rosuvastatin (CRESTOR) 10 MG tablet   Left foot pain    Has had bilateral bunions removed and that was helpful but she does still experience some left foot pain, will follow up with podiatry. She is in need of a new prescription for diabetic shoes       Paresthesia of hand    She notes intermittent numbness in 2nd and 3rd fingers of left hand. Suspect CTS will try ice and lidocaine patches and consider referral to hand surgeon if persists       Insomnia    Encouraged good sleep hygiene such as dark, quiet room. No blue/green glowing lights such as computer screens in bedroom. No alcohol or stimulants in evening. Cut down on caffeine as able. Regular exercise is helpful but not just prior to bed time.        Other Visit Diagnoses     Cough    -  Primary   Relevant Orders   DG Chest 2 View   OAB (overactive bladder)       Relevant Medications   oxybutynin (DITROPAN XL) 10 MG 24 hr tablet         Meds ordered this encounter  Medications   rosuvastatin (CRESTOR) 10 MG tablet    Sig: Take 1 tablet (10 mg total) by mouth daily.    Dispense:  90 tablet    Refill:  1   DISCONTD: omeprazole (PRILOSEC) 40 MG capsule    Sig: Take 1 capsule (40 mg total) by mouth in the morning and at bedtime.    Dispense:  180 capsule    Refill:  1   omeprazole (PRILOSEC) 40 MG capsule    Sig: Take 1 capsule (40 mg total) by mouth in the morning and at bedtime.    Dispense:  180 capsule    Refill:  1   nystatin cream (MYCOSTATIN)    Sig: Apply 1 application topically 2 (two) times daily.    Dispense:  30 g    Refill:  5   oxybutynin (DITROPAN XL) 10 MG 24 hr tablet    Sig: Take 1 tablet (10 mg total) by mouth at bedtime.     Dispense:  30 tablet    Refill:  2    I discussed the assessment and treatment plan with the patient.  The patient was provided an opportunity to ask questions and all were answered. The patient agreed with the plan and demonstrated an understanding of the instructions.   The patient was advised to call back or seek an in-person evaluation if the symptoms worsen or if the condition fails to improve as anticipated.  I provided 21 minutes of face-to-face time during this encounter.   Danise EdgeStacey Blyth, MD [facility] [phone] (phone) [phone] (fax)  [facility]

## 2020-11-11 ENCOUNTER — Ambulatory Visit: Payer: Medicare HMO | Admitting: Orthopedic Surgery

## 2020-11-11 ENCOUNTER — Encounter: Payer: Self-pay | Admitting: Orthopedic Surgery

## 2020-11-11 DIAGNOSIS — M1712 Unilateral primary osteoarthritis, left knee: Secondary | ICD-10-CM | POA: Diagnosis not present

## 2020-11-11 DIAGNOSIS — G4700 Insomnia, unspecified: Secondary | ICD-10-CM | POA: Insufficient documentation

## 2020-11-11 DIAGNOSIS — R202 Paresthesia of skin: Secondary | ICD-10-CM | POA: Insufficient documentation

## 2020-11-11 MED ORDER — LIDOCAINE HCL 1 % IJ SOLN
5.0000 mL | INTRAMUSCULAR | Status: AC | PRN
  Administered 2020-11-11: 5 mL

## 2020-11-11 MED ORDER — CROSS-LINK HYAL ACID (VISC) 30 MG/3ML IX PRSY
30.0000 mg | PREFILLED_SYRINGE | INTRA_ARTICULAR | Status: AC | PRN
  Administered 2020-11-11: 30 mg via INTRA_ARTICULAR

## 2020-11-11 NOTE — Assessment & Plan Note (Signed)
She notes intermittent numbness in 2nd and 3rd fingers of left hand. Suspect CTS will try ice and lidocaine patches and consider referral to hand surgeon if persists

## 2020-11-11 NOTE — Progress Notes (Signed)
   Procedure Note  Patient: Abigail Mcpherson             Date of Birth: [birth year]           MRN: [id number]             Visit Date: 11/11/2020  Procedures: Visit Diagnoses:  1. Unilateral primary osteoarthritis, left knee     Large Joint Inj on 11/11/2020 1:34 PM Indications: diagnostic evaluation, joint swelling and pain Details: 18 G 1.5 in needle, superolateral approach  Arthrogram: No  Medications: 5 mL lidocaine 1 %; 30 mg Cross-Linked Hyaluronate 30 MG/3ML Outcome: tolerated well, no immediate complications Procedure, treatment alternatives, risks and benefits explained, specific risks discussed. Consent was given by the patient. Immediately prior to procedure a time out was called to verify the correct patient, procedure, equipment, support staff and site/side marked as required. Patient was prepped and draped in the usual sterile fashion.

## 2020-11-11 NOTE — Assessment & Plan Note (Signed)
Will try with Oxybutynin xl 10 mg daily and consider urology referral if need be

## 2020-11-11 NOTE — Assessment & Plan Note (Addendum)
Has had bilateral bunions removed and that was helpful but she does still experience some left foot pain, will follow up with podiatry. She is in need of a new prescription for diabetic shoes

## 2020-11-11 NOTE — Assessment & Plan Note (Signed)
Encouraged good sleep hygiene such as dark, quiet room. No blue/green glowing lights such as computer screens in bedroom. No alcohol or stimulants in evening. Cut down on caffeine as able. Regular exercise is helpful but not just prior to bed time.  

## 2020-11-12 ENCOUNTER — Other Ambulatory Visit: Payer: Self-pay | Admitting: Family Medicine

## 2020-11-26 ENCOUNTER — Telehealth: Payer: Self-pay | Admitting: Podiatry

## 2020-11-26 NOTE — Telephone Encounter (Signed)
Called pt to let her know we got an updated auth for her shoes and she can pick them up at her appt on 8.17.  She said thank you.

## 2020-12-02 ENCOUNTER — Other Ambulatory Visit: Payer: Self-pay

## 2020-12-02 ENCOUNTER — Encounter: Payer: Self-pay | Admitting: Family Medicine

## 2020-12-02 ENCOUNTER — Ambulatory Visit: Payer: Medicare HMO | Admitting: Podiatry

## 2020-12-02 DIAGNOSIS — M79674 Pain in right toe(s): Secondary | ICD-10-CM

## 2020-12-02 DIAGNOSIS — M79675 Pain in left toe(s): Secondary | ICD-10-CM

## 2020-12-02 DIAGNOSIS — E119 Type 2 diabetes mellitus without complications: Secondary | ICD-10-CM

## 2020-12-02 DIAGNOSIS — B351 Tinea unguium: Secondary | ICD-10-CM | POA: Diagnosis not present

## 2020-12-02 DIAGNOSIS — M2012 Hallux valgus (acquired), left foot: Secondary | ICD-10-CM

## 2020-12-02 DIAGNOSIS — M2042 Other hammer toe(s) (acquired), left foot: Secondary | ICD-10-CM

## 2020-12-02 DIAGNOSIS — M2041 Other hammer toe(s) (acquired), right foot: Secondary | ICD-10-CM

## 2020-12-02 DIAGNOSIS — M2011 Hallux valgus (acquired), right foot: Secondary | ICD-10-CM

## 2020-12-03 NOTE — Progress Notes (Signed)
  Subjective:  Patient ID: Abigail Mcpherson, female    DOB: 06/18/1932,  MRN: [id number]  Chief Complaint  Patient presents with   Nail Problem    Nail trim    87 y.o. female returns for the above complaint.  Patient presents with thickened elongated dystrophic toenails x10.  Patient would like to have them to be done as she is not able to do so.  They are mildly painful to touch.  Painful when walking.  She is a type II diabetic with last A1c of 7.1.  She denies any other acute complaints.  Objective:  There were no vitals filed for this visit. Podiatric Exam: Vascular: dorsalis pedis and posterior tibial pulses are palpable bilateral. Capillary return is immediate. Temperature gradient is WNL. Skin turgor WNL  Sensorium: Diminished Semmes Weinstein monofilament test.  Diminished tactile sensation bilaterally. Nail Exam: Pt has thick disfigured discolored nails with subungual debris noted bilateral entire nail hallux through fifth toenails.  Pain on palpation to the nails. Ulcer Exam: There is no evidence of ulcer or pre-ulcerative changes or infection. Orthopedic Exam: Muscle tone and strength are WNL. No limitations in general ROM. No crepitus or effusions noted. HAV  B/L.  Hammer toes 2-5  B/L. Skin: No Porokeratosis. No infection or ulcers    Assessment & Plan:   1. Diabetes mellitus without complication (HCC)   2. Pain due to onychomycosis of toenails of both feet       Patient was evaluated and treated and all questions answered.  Hammertoe contractures bilateral 2 through 5 -I explained to patient the etiology of contractures and various treatment options were discussed.  Given the patient is an uncontrolled diabetic with last A1c of 7.1 I believe she will benefit from diabetic shoes to evenly distribute the pressure and offload the contractures therefore preventing ulcerations in future amputations.  Patient agrees with the plan. -She has not received her diabetic  shoes.  Onychomycosis with pain  -Nails palliatively debrided as below. -Educated on self-care  Procedure: Nail Debridement Rationale: pain  Type of Debridement: manual, sharp debridement. Instrumentation: Nail nipper, rotary burr. Number of Nails: 10  Procedures and Treatment: Consent by patient was obtained for treatment procedures. The patient understood the discussion of treatment and procedures well. All questions were answered thoroughly reviewed. Debridement of mycotic and hypertrophic toenails, 1 through 5 bilateral and clearing of subungual debris. No ulceration, no infection noted.  Return Visit-Office Procedure: Patient instructed to return to the office for a follow up visit 3 months for continued evaluation and treatment.  Kanav Kazmierczak, DPM    No follow-ups on file.  

## 2021-01-06 ENCOUNTER — Other Ambulatory Visit: Payer: Self-pay | Admitting: Family Medicine

## 2021-01-06 ENCOUNTER — Telehealth: Payer: Self-pay

## 2021-01-06 ENCOUNTER — Ambulatory Visit (INDEPENDENT_AMBULATORY_CARE_PROVIDER_SITE_OTHER): Payer: Medicare HMO

## 2021-01-06 VITALS — BP 144/80 | HR 61 | Ht 62.0 in | Wt 157.2 lb

## 2021-01-06 DIAGNOSIS — Z0000 Encounter for general adult medical examination without abnormal findings: Secondary | ICD-10-CM

## 2021-01-06 MED ORDER — ALBUTEROL SULFATE HFA 108 (90 BASE) MCG/ACT IN AERS: INHALATION_SPRAY | RESPIRATORY_TRACT | 1 refills | Status: DC

## 2021-01-06 NOTE — Patient Instructions (Signed)
Abigail Mcpherson , Thank you for taking time to complete your Medicare Wellness Visit. I appreciate your ongoing commitment to your health goals. Please review the following plan we discussed and let me know if I can assist you in the future.   Screening recommendations/referrals: Colonoscopy: No longer required Mammogram: Scheduled for 02/03/2021 Bone Density: Declined today. Recommended yearly ophthalmology/optometry visit for glaucoma screening and checkup Recommended yearly dental visit for hygiene and checkup  Vaccinations: Influenza vaccine: Declined Pneumococcal vaccine: Due-May obtain vaccine at our office or your local pharmacy. Tdap vaccine: Discuss with pharmacy Shingles vaccine: Discuss with pharmacy   Covid-19:Newest booster available at the pharmacy.  Advanced directives: Information mailed today  Conditions/risks identified: See problem list  Next appointment: Follow up in one year for your annual wellness visit    Preventive Care 65 Years and Older, Female Preventive care refers to lifestyle choices and visits with your health care provider that can promote health and wellness. What does preventive care include? A yearly physical exam. This is also called an annual well check. Dental exams once or twice a year. Routine eye exams. Ask your health care provider how often you should have your eyes checked. Personal lifestyle choices, including: Daily care of your teeth and gums. Regular physical activity. Eating a healthy diet. Avoiding tobacco and drug use. Limiting alcohol use. Practicing safe sex. Taking low-dose aspirin every day. Taking vitamin and mineral supplements as recommended by your health care provider. What happens during an annual well check? The services and screenings done by your health care provider during your annual well check will depend on your age, overall health, lifestyle risk factors, and family history of disease. Counseling  Your health  care provider may ask you questions about your: Alcohol use. Tobacco use. Drug use. Emotional well-being. Home and relationship well-being. Sexual activity. Eating habits. History of falls. Memory and ability to understand (cognition). Work and work Astronomer. Reproductive health. Screening  You may have the following tests or measurements: Height, weight, and BMI. Blood pressure. Lipid and cholesterol levels. These may be checked every 5 years, or more frequently if you are over 64 years old. Skin check. Lung cancer screening. You may have this screening every year starting at age 41 if you have a 30-pack-year history of smoking and currently smoke or have quit within the past 15 years. Fecal occult blood test (FOBT) of the stool. You may have this test every year starting at age 16. Flexible sigmoidoscopy or colonoscopy. You may have a sigmoidoscopy every 5 years or a colonoscopy every 10 years starting at age 14. Hepatitis C blood test. Hepatitis B blood test. Sexually transmitted disease (STD) testing. Diabetes screening. This is done by checking your blood sugar (glucose) after you have not eaten for a while (fasting). You may have this done every 1-3 years. Bone density scan. This is done to screen for osteoporosis. You may have this done starting at age 46. Mammogram. This may be done every 1-2 years. Talk to your health care provider about how often you should have regular mammograms. Talk with your health care provider about your test results, treatment options, and if necessary, the need for more tests. Vaccines  Your health care provider may recommend certain vaccines, such as: Influenza vaccine. This is recommended every year. Tetanus, diphtheria, and acellular pertussis (Tdap, Td) vaccine. You may need a Td booster every 10 years. Zoster vaccine. You may need this after age 64. Pneumococcal 13-valent conjugate (PCV13) vaccine. One dose is recommended  after age  61. Pneumococcal polysaccharide (PPSV23) vaccine. One dose is recommended after age 62. Talk to your health care provider about which screenings and vaccines you need and how often you need them. This information is not intended to replace advice given to you by your health care provider. Make sure you discuss any questions you have with your health care provider. Document Released: 05/01/2015 Document Revised: 12/23/2015 Document Reviewed: 02/03/2015 Elsevier Interactive Patient Education  2017 [place] Prevention in the Home Falls can cause injuries. They can happen to people of all ages. There are many things you can do to make your home safe and to help prevent falls. What can I do on the outside of my home? Regularly fix the edges of walkways and driveways and fix any cracks. Remove anything that might make you trip as you walk through a door, such as a raised step or threshold. Trim any bushes or trees on the path to your home. Use bright outdoor lighting. Clear any walking paths of anything that might make someone trip, such as rocks or tools. Regularly check to see if handrails are loose or broken. Make sure that both sides of any steps have handrails. Any raised decks and porches should have guardrails on the edges. Have any leaves, snow, or ice cleared regularly. Use sand or salt on walking paths during winter. Clean up any spills in your garage right away. This includes oil or grease spills. What can I do in the bathroom? Use night lights. Install grab bars by the toilet and in the tub and shower. Do not use towel bars as grab bars. Use non-skid mats or decals in the tub or shower. If you need to sit down in the shower, use a plastic, non-slip stool. Keep the floor dry. Clean up any water that spills on the floor as soon as it happens. Remove soap buildup in the tub or shower regularly. Attach bath mats securely with double-sided non-slip rug tape. Do not have throw  rugs and other things on the floor that can make you trip. What can I do in the bedroom? Use night lights. Make sure that you have a light by your bed that is easy to reach. Do not use any sheets or blankets that are too big for your bed. They should not hang down onto the floor. Have a firm chair that has side arms. You can use this for support while you get dressed. Do not have throw rugs and other things on the floor that can make you trip. What can I do in the kitchen? Clean up any spills right away. Avoid walking on wet floors. Keep items that you use a lot in easy-to-reach places. If you need to reach something above you, use a strong step stool that has a grab bar. Keep electrical cords out of the way. Do not use floor polish or wax that makes floors slippery. If you must use wax, use non-skid floor wax. Do not have throw rugs and other things on the floor that can make you trip. What can I do with my stairs? Do not leave any items on the stairs. Make sure that there are handrails on both sides of the stairs and use them. Fix handrails that are broken or loose. Make sure that handrails are as long as the stairways. Check any carpeting to make sure that it is firmly attached to the stairs. Fix any carpet that is loose or worn. Avoid having throw  rugs at the top or bottom of the stairs. If you do have throw rugs, attach them to the floor with carpet tape. Make sure that you have a light switch at the top of the stairs and the bottom of the stairs. If you do not have them, ask someone to add them for you. What else can I do to help prevent falls? Wear shoes that: Do not have high heels. Have rubber bottoms. Are comfortable and fit you well. Are closed at the toe. Do not wear sandals. If you use a stepladder: Make sure that it is fully opened. Do not climb a closed stepladder. Make sure that both sides of the stepladder are locked into place. Ask someone to hold it for you, if  possible. Clearly mark and make sure that you can see: Any grab bars or handrails. First and last steps. Where the edge of each step is. Use tools that help you move around (mobility aids) if they are needed. These include: Canes. Walkers. Scooters. Crutches. Turn on the lights when you go into a dark area. Replace any light bulbs as soon as they burn out. Set up your furniture so you have a clear path. Avoid moving your furniture around. If any of your floors are uneven, fix them. If there are any pets around you, be aware of where they are. Review your medicines with your doctor. Some medicines can make you feel dizzy. This can increase your chance of falling. Ask your doctor what other things that you can do to help prevent falls. This information is not intended to replace advice given to you by your health care provider. Make sure you discuss any questions you have with your health care provider. Document Released: 01/29/2009 Document Revised: 09/10/2015 Document Reviewed: 05/09/2014 Elsevier Interactive Patient Education  2017 [employer].

## 2021-01-06 NOTE — Telephone Encounter (Signed)
Patient is requesting refills of her Albuterol inhaler.

## 2021-01-06 NOTE — Progress Notes (Signed)
Subjective:   Abigail Mcpherson is a 87 y.o. female who presents for an Initial Medicare Annual Wellness Visit.   I connected with [name] today by telephone and verified that I am speaking with the correct person using two identifiers. 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    I discussed the limitations, risks, security and privacy concerns of performing an evaluation and management service by telephone and the availability of in person appointments. I also discussed with the patient that there may be a patient responsible charge related to this service. The patient expressed understanding and verbally consented to this telephonic visit.    Interactive audio and video telecommunications were attempted between this provider and patient, however failed, due to patient having technical difficulties OR patient did not have access to video capability.  We continued and completed visit with audio only.  Some vital signs may be absent or patient reported.   Time Spent with patient on telephone encounter: 30 minutes  Review of Systems     Cardiac Risk Factors include: advanced age (>23men, >56 women);diabetes mellitus;dyslipidemia;hypertension     Objective:    Today's Vitals   01/06/21 1102 01/06/21 1106  BP: (!) 144/80   Pulse: 61   Weight: 157 lb 3.2 oz (71.3 kg)   Height: 5\' 2"  (1.575 m)   PainSc:  7    Body mass index is 28.75 kg/m.  Advanced Directives 01/06/2021  Does Patient Have a Medical Advance Directive? No  Would patient like information on creating a medical advance directive? Yes (MAU/Ambulatory/Procedural Areas - Information given)    Current Medications (verified) Outpatient Encounter Medications as of 01/06/2021  Medication Sig   albuterol (VENTOLIN HFA) 108 (90 Base) MCG/ACT inhaler USE 2 INHALATIONS BY MOUTH  EVERY 6 HOURS AS NEEDED FOR WHEEZING OR SHORTNESS OF  BREATH   amLODipine  (NORVASC) 2.5 MG tablet TAKE 1 TABLET BY MOUTH  DAILY   benzonatate (TESSALON) 100 MG capsule TAKE 1 CAPSULE(100 MG) BY MOUTH THREE TIMES DAILY AS NEEDED FOR COUGH   cetirizine (ZYRTEC) 10 MG tablet Take 1 tablet (10 mg total) by mouth daily.   famotidine (PEPCID) 40 MG tablet TAKE 1 TABLET BY MOUTH AT  BEDTIME   furosemide (LASIX) 20 MG tablet TAKE 1 TABLET BY MOUTH  DAILY AND A SECOND TABLET  DAILY AS NEEDED FOR PEDAL  EDEMA OR WEIGHT GAIN OF 3  LB IN 24 HOURS   hydrochlorothiazide (MICROZIDE) 12.5 MG capsule TAKE 1 CAPSULE BY MOUTH  DAILY   metFORMIN (GLUCOPHAGE-XR) 500 MG 24 hr tablet TAKE 2 TABLETS BY MOUTH IN  THE MORNING AND 1 TABLET IN THE EVENING   montelukast (SINGULAIR) 10 MG tablet TAKE 1 TABLET BY MOUTH AT  BEDTIME AS NEEDED FOR  ALLERGIES   nystatin cream (MYCOSTATIN) Apply 1 application topically 2 (two) times daily.   omeprazole (PRILOSEC) 40 MG capsule Take 1 capsule (40 mg total) by mouth in the morning and at bedtime.   oxybutynin (DITROPAN XL) 10 MG 24 hr tablet Take 1 tablet (10 mg total) by mouth at bedtime.   potassium chloride SA (KLOR-CON) 20 MEQ tablet TAKE 1 TABLET BY MOUTH  DAILY   rosuvastatin (CRESTOR) 10 MG tablet Take 1 tablet (10 mg total) by mouth daily.   tiZANidine (ZANAFLEX) 2 MG tablet Take 0.5-1 tablets (1-2 mg total) by mouth at bedtime as needed for muscle spasms.   aspirin 81 MG tablet Take 81 mg by mouth daily.  Daily OTC (Patient not taking: No sig reported)   No facility-administered encounter medications on file as of 01/06/2021.    Allergies (verified) Iodine, Shellfish allergy, Codeine, and Demerol [meperidine hcl]   History: Past Medical History:  Diagnosis Date   Allergy    Cataract    Diabetes mellitus without complication (HCC)    GERD (gastroesophageal reflux disease)    History of shingles    Hyperlipidemia    Hypertension    Sleep apnea    cannot tolerate CPAP   Past Surgical History:  Procedure Laterality Date   APPENDECTOMY      BREAST SURGERY     breast biopsy left , b/l lumpectomy 1969   BUNIONECTOMY Bilateral    CESAREAN SECTION     Family History  Problem Relation Age of Onset   Diabetes Mother    Hypertension Mother    Hernia Mother        inguinal   Diabetes Father    Stroke Son    Aneurysm Paternal Aunt        brain   Cancer Sister        intestinal   Hypertension Sister    Diabetes Sister    Glaucoma Sister    Diabetes Sister    Hypertension Sister    Hyperlipidemia Sister    Diabetes Sister    Hypertension Sister    Hyperlipidemia Sister    Hernia Sister        umbilical   Hyperlipidemia Sister    Hypertension Sister    Diabetes Sister    Alcohol abuse Sister    Diabetes Sister    Hyperlipidemia Sister    Hypertension Sister    Obesity Sister    Stomach cancer Neg Hx    Colon cancer Neg Hx    Esophageal cancer Neg Hx    Colon polyps Neg Hx    Social History   Socioeconomic History   Marital status: Divorced    Spouse name: Not on file   Number of children: Not on file   Years of education: Not on file   Highest education level: Not on file  Occupational History   Occupation: Nurse    Comment: retired  Tobacco Use   Smoking status: Former    Types: Cigarettes   Smokeless tobacco: Never  Building services engineer Use: Never used  Substance and Sexual Activity   Alcohol use: Yes    Comment: occ   Drug use: Never   Sexual activity: Not Currently  Other Topics Concern   Not on file  Social History Narrative   Avoids pork and shellfish and deep sea fish. Lives with family, daughter, wears seat belt always. Divorced, retired Engineer, civil (consulting)   Social Determinants of Corporate investment banker Strain: Low Risk    Difficulty of Paying Living Expenses: Not hard at all  Food Insecurity: No Food Insecurity   Worried About Programme researcher, broadcasting/film/video in the Last Year: Never true   Barista in the Last Year: Never true  Transportation Needs: No Transportation Needs   Lack of  Transportation (Medical): No   Lack of Transportation (Non-Medical): No  Physical Activity: Insufficiently Active   Days of Exercise per Week: 7 days   Minutes of Exercise per Session: 20 min  Stress: No Stress Concern Present   Feeling of Stress : Not at all  Social Connections: Socially Isolated   Frequency of Communication with Friends and Family: More than three times  a week   Frequency of Social Gatherings with Friends and Family: More than three times a week   Attends Religious Services: Never   Database administrator or Organizations: No   Attends Engineer, structural: Never   Marital Status: Divorced    Tobacco Counseling Counseling given: Not Answered   Clinical Intake:  Pre-visit preparation completed: Yes  Pain : 0-10 Pain Score: 7  Pain Type: Chronic pain Pain Location: Foot (Sees a podiatrist) Pain Orientation: Left Pain Onset: More than a month ago Pain Frequency: Intermittent     BMI - recorded: 28.75 Nutritional Status: BMI 25 -29 Overweight Nutritional Risks: None Diabetes: Yes CBG done?: No Did pt. bring in CBG monitor from home?: No (phone visit)  How often do you need to have someone help you when you read instructions, pamphlets, or other written materials from your doctor or pharmacy?: 1 - Never  Diabetes:  Is the patient diabetic?  Yes  If diabetic, was a CBG obtained today?  No  Did the patient bring in their glucometer from home?  Yes  How often do you monitor your CBG's? Daily.   Financial Strains and Diabetes Management:  Are you having any financial strains with the device, your supplies or your medication? No .  Does the patient want to be seen by Chronic Care Management for management of their diabetes?  No  Would the patient like to be referred to a Nutritionist or for Diabetic Management?  No   Diabetic Exams:  Diabetic Eye Exam: Completed 03/2020-per patient.   Diabetic Foot Exam: Pt has been advised about the  importance in completing this exam.To be completed by PCP.Marland Kitchen    Interpreter Needed?: No  Information entered by :: Thomasenia Sales LPN   Activities of Daily Living In your present state of health, do you have any difficulty performing the following activities: 01/06/2021  Hearing? N  Vision? N  Difficulty concentrating or making decisions? N  Walking or climbing stairs? N  Dressing or bathing? N  Doing errands, shopping? N  Preparing Food and eating ? N  Using the Toilet? N  In the past six months, have you accidently leaked urine? N  Do you have problems with loss of bowel control? N  Managing your Medications? N  Managing your Finances? N  Housekeeping or managing your Housekeeping? N  Some recent data might be hidden    Patient Care Team: [name], MD as PCP - General (Family Medicine)  Indicate any recent Medical Services you may have received from other than [facility] providers in the past year (date may be approximate).     Assessment:   This is a routine wellness examination for Abigail Mcpherson.  Hearing/Vision screen Hearing Screening - Comments:: No issues Vision Screening - Comments:: Last eye exam-03/2020-Dr.Woods  Dietary issues and exercise activities discussed: Current Exercise Habits: Structured exercise class (physical therapy), Type of exercise: strength training/weights;stretching, Time (Minutes): 15, Frequency (Times/Week): 7, Weekly Exercise (Minutes/Week): 105, Intensity: Mild, Exercise limited by: orthopedic condition(s) (knee pain)   Goals Addressed             This Visit's Progress    Patient Stated       Maintain current health & independence       Depression Screen PHQ 2/9 Scores 01/06/2021 01/31/2020 05/29/2018  PHQ - 2 Score 0 1 0    Fall Risk Fall Risk  01/06/2021 05/29/2018  Falls in the past year? 0 0  Number falls in  past yr: 0 -  Injury with Fall? 0 -  Follow up Falls prevention discussed -    FALL RISK PREVENTION PERTAINING TO  THE HOME:  Any stairs in or around the home? Yes  If so, are there any without handrails? No  Home free of loose throw rugs in walkways, pet beds, electrical cords, etc? Yes  Adequate lighting in your home to reduce risk of falls? Yes   ASSISTIVE DEVICES UTILIZED TO PREVENT FALLS:  Life alert? No  Use of a cane, walker or w/c? Yes  Grab bars in the bathroom? Yes  Shower chair or bench in shower? Yes  Elevated toilet seat or a handicapped toilet? No   TIMED UP AND GO:  Was the test performed? No . Phone visit   Cognitive Function:Normal cognitive status assessed by this Nurse Health Advisor. No abnormalities found.          Immunizations Immunization History  Administered Date(s) Administered   [employer] Vaccination 06/16/2019, 07/14/2019   PFIZER(Purple Top)SARS-COV-2 Vaccination 05/14/2020, 09/25/2020    TDAP status: Due, Education has been provided regarding the importance of this vaccine. Advised may receive this vaccine at local pharmacy or Health Dept. Aware to provide a copy of the vaccination record if obtained from local pharmacy or Health Dept. Verbalized acceptance and understanding.  Flu Vaccine status: Declined, Education has been provided regarding the importance of this vaccine but patient still declined. Advised may receive this vaccine at local pharmacy or Health Dept. Aware to provide a copy of the vaccination record if obtained from local pharmacy or Health Dept. Verbalized acceptance and understanding.  Pneumococcal vaccine status: Due, Education has been provided regarding the importance of this vaccine. Advised may receive this vaccine at local pharmacy or Health Dept. Aware to provide a copy of the vaccination record if obtained from local pharmacy or Health Dept. Verbalized acceptance and understanding.  Covid-19 vaccine status: Completed vaccines  Qualifies for Shingles Vaccine? Yes   Zostavax completed No   Shingrix Completed?: No.     Education has been provided regarding the importance of this vaccine. Patient has been advised to call insurance company to determine out of pocket expense if they have not yet received this vaccine. Advised may also receive vaccine at local pharmacy or Health Dept. Verbalized acceptance and understanding.  Screening Tests Health Maintenance  Topic Date Due   FOOT EXAM  Never done   URINE MICROALBUMIN  Never done   TETANUS/TDAP  Never done   Zoster Vaccines- Shingrix (1 of 2) Never done   DEXA SCAN  Never done   INFLUENZA VACCINE  Never done   OPHTHALMOLOGY EXAM  11/25/2020   HEMOGLOBIN A1C  05/08/2021   COVID-19 Vaccine  Completed   HPV VACCINES  Aged Out    Health Maintenance  Health Maintenance Due  Topic Date Due   FOOT EXAM  Never done   URINE MICROALBUMIN  Never done   TETANUS/TDAP  Never done   Zoster Vaccines- Shingrix (1 of 2) Never done   DEXA SCAN  Never done   INFLUENZA VACCINE  Never done   OPHTHALMOLOGY EXAM  11/25/2020    Colorectal cancer screening: No longer required.   Mammogram status: Scheduled for 02/03/2021  Bone Density status: Declined today  Lung Cancer Screening: (Low Dose CT Chest recommended if Age 10-80 years, 30 pack-year currently smoking OR have quit w/in 15years.) does not qualify.     Additional Screening:  Hepatitis C Screening: does not qualify  Vision  Screening: Recommended annual ophthalmology exams for early detection of glaucoma and other disorders of the eye. Is the patient up to date with their annual eye exam?  Yes  Who is the provider or what is the name of the office in which the patient attends annual eye exams? Dr. Joseph Art   Dental Screening: Recommended annual dental exams for proper oral hygiene  Community Resource Referral / Chronic Care Management: CRR required this visit?  No   CCM required this visit?  No      Plan:     I have personally reviewed and noted the following in the patient's chart:   Medical  and social history Use of alcohol, tobacco or illicit drugs  Current medications and supplements including opioid prescriptions. Patient is not currently taking opioid prescriptions. Functional ability and status Nutritional status Physical activity Advanced directives List of other physicians Hospitalizations, surgeries, and ER visits in previous 12 months Vitals Screenings to include cognitive, depression, and falls Referrals and appointments  In addition, I have reviewed and discussed with patient certain preventive protocols, quality metrics, and best practice recommendations. A written personalized care plan for preventive services as well as general preventive health recommendations were provided to patient.   Due to this being a telephonic visit, the after visit summary with patients personalized plan was offered to patient via mail or my-chart.  Patient would like to access on my-chart.   Roanna Raider, LPN   08/12/621  Nurse Health Advisor  Nurse Notes: None

## 2021-02-02 ENCOUNTER — Other Ambulatory Visit: Payer: Self-pay | Admitting: Family Medicine

## 2021-02-03 ENCOUNTER — Other Ambulatory Visit: Payer: Self-pay

## 2021-02-03 ENCOUNTER — Ambulatory Visit
Admission: RE | Admit: 2021-02-03 | Discharge: 2021-02-03 | Disposition: A | Discharge status: Home / Self Care | Payer: Medicare HMO | Source: Ambulatory Visit | Attending: Family Medicine | Admitting: Family Medicine

## 2021-02-03 DIAGNOSIS — Z1239 Encounter for other screening for malignant neoplasm of breast: Secondary | ICD-10-CM

## 2021-02-06 ENCOUNTER — Other Ambulatory Visit: Payer: Self-pay | Admitting: Family Medicine

## 2021-02-06 DIAGNOSIS — N3281 Overactive bladder: Secondary | ICD-10-CM

## 2021-02-10 ENCOUNTER — Other Ambulatory Visit: Payer: Self-pay | Admitting: Family Medicine

## 2021-02-15 ENCOUNTER — Encounter: Payer: Self-pay | Admitting: Orthopedic Surgery

## 2021-02-15 ENCOUNTER — Ambulatory Visit: Payer: Medicare HMO | Admitting: Orthopedic Surgery

## 2021-02-15 ENCOUNTER — Other Ambulatory Visit: Payer: Self-pay

## 2021-02-15 DIAGNOSIS — M25562 Pain in left knee: Secondary | ICD-10-CM | POA: Diagnosis not present

## 2021-02-15 DIAGNOSIS — M1712 Unilateral primary osteoarthritis, left knee: Secondary | ICD-10-CM

## 2021-02-23 ENCOUNTER — Encounter: Payer: Self-pay | Admitting: Orthopedic Surgery

## 2021-02-23 MED ORDER — LIDOCAINE HCL 1 % IJ SOLN
5.0000 mL | INTRAMUSCULAR | Status: AC | PRN
Start: 2021-02-15 — End: 2021-02-15
  Administered 2021-02-15: 5 mL

## 2021-02-23 MED ORDER — METHYLPREDNISOLONE ACETATE 40 MG/ML IJ SUSP
40.0000 mg | INTRAMUSCULAR | Status: AC | PRN
  Administered 2021-02-15: 40 mg via INTRA_ARTICULAR

## 2021-02-23 MED ORDER — BUPIVACAINE HCL 0.25 % IJ SOLN
4.0000 mL | INTRAMUSCULAR | Status: AC | PRN
Start: 2021-02-15 — End: 2021-02-15
  Administered 2021-02-15: 4 mL via INTRA_ARTICULAR

## 2021-02-23 NOTE — Progress Notes (Addendum)
Office Visit Note   Patient: Abigail Mcpherson           Date of Birth: 04-01-1933           MRN: [id number] Visit Date: 02/15/2021 Requested by: [name], MD [address] [place],  [place] [address] PCP: [name], MD  Subjective: Chief Complaint  Patient presents with   Left Knee - Follow-up    HPI: Abigail Mcpherson is an 87 year old patient with left knee pain.  She had gel injection in July which gave her some relief.  She is having some recurrent symptoms now.  She is still able to get around.  No interval injuries to the left knee.  Reports pain which is aggravating but not yet debilitating.              ROS: All systems reviewed are negative as they relate to the chief complaint within the history of present illness.  Patient denies  fevers or chills.   Assessment & Plan: Visit Diagnoses: No diagnosis found.  Plan: Impression is left knee arthritis.  Plan is left knee cortisone injection and aspiration today.  Plan for gel injection in 3 months.  We will try to continue this regimen as long as it remains effective.  So far the alternating cortisone and gel shots have diminished her pain symptoms incrementally.  Handicap sticker for 6 months provided.  Return 05/14/2021 for cortisone injections.  She is using a cane for balance.  Follow-Up Instructions: Return in about 3 months (around 05/18/2021).   Orders:  No orders of the defined types were placed in this encounter.  No orders of the defined types were placed in this encounter.     Procedures: Large Joint Inj: L knee on 02/15/2021 7:31 AM Indications: diagnostic evaluation, joint swelling and pain Details: 18 G 1.5 in needle, superolateral approach  Arthrogram: No  Medications: 5 mL lidocaine 1 %; 40 mg methylPREDNISolone acetate 40 MG/ML; 4 mL bupivacaine 0.25 % Outcome: tolerated well, no immediate complications Procedure, treatment alternatives, risks and benefits explained, specific risks  discussed. Consent was given by the patient. Immediately prior to procedure a time out was called to verify the correct patient, procedure, equipment, support staff and site/side marked as required. Patient was prepped and draped in the usual sterile fashion.      Clinical Data: No additional findings.  Objective: Vital Signs: There were no vitals taken for this visit.  Physical Exam:   Constitutional: Patient appears well-developed HEENT:  Head: Normocephalic Eyes:EOM are normal Neck: Normal range of motion Cardiovascular: Normal rate Pulmonary/chest: Effort normal Neurologic: Patient is alert Skin: Skin is warm Psychiatric: Patient has normal mood and affect   Ortho Exam: Ortho exam demonstrates varus alignment of that left knee.  Mild effusion is present pedal pulses palpable no groin pain with internal or external Tatian of the leg.  Range of motion is about 5 to 95 degrees.  Extensor mechanism is intact This patient is diagnosed with osteoarthritis of the knee(s).    Radiographs show evidence of joint space narrowing, osteophytes, subchondral sclerosis and/or subchondral cysts.  This patient has knee pain which interferes with functional and activities of daily living.    This patient has experienced inadequate response, adverse effects and/or intolerance with conservative treatments such as acetaminophen, NSAIDS, topical creams, physical therapy or regular exercise, knee bracing and/or weight loss.   This patient has experienced inadequate response or has a contraindication to intra articular steroid injections  for at least 3 months.   This patient is not scheduled to have a total knee replacement within 6 months of starting treatment with viscosupplementation.   Specialty Comments:  No specialty comments available.  Imaging: No results found.   PMFS History: Patient Active Problem List   Diagnosis Date Noted   Paresthesia of hand 11/11/2020   Insomnia 11/11/2020    Left foot pain 11/10/2020   Glaucoma 12/09/2019   Reflux esophagitis 12/09/2019   Pedal edema 08/21/2018   Urinary incontinence 05/29/2018   Knee pain, bilateral 05/29/2018   Allergy    History of shingles    Diabetes mellitus without complication (HCC)    Hyperlipidemia    Hypertension    Cataract    Past Medical History:  Diagnosis Date   Allergy    Cataract    Diabetes mellitus without complication (HCC)    GERD (gastroesophageal reflux disease)    History of shingles    Hyperlipidemia    Hypertension    Sleep apnea    cannot tolerate CPAP    Family History  Problem Relation Age of Onset   Diabetes Mother    Hypertension Mother    Hernia Mother        inguinal   Diabetes Father    Stroke Son    Aneurysm Paternal Aunt        brain   Cancer Sister        intestinal   Hypertension Sister    Diabetes Sister    Glaucoma Sister    Diabetes Sister    Hypertension Sister    Hyperlipidemia Sister    Diabetes Sister    Hypertension Sister    Hyperlipidemia Sister    Hernia Sister        umbilical   Hyperlipidemia Sister    Hypertension Sister    Diabetes Sister    Alcohol abuse Sister    Diabetes Sister    Hyperlipidemia Sister    Hypertension Sister    Obesity Sister    Stomach cancer Neg Hx    Colon cancer Neg Hx    Esophageal cancer Neg Hx    Colon polyps Neg Hx     Past Surgical History:  Procedure Laterality Date   APPENDECTOMY     BREAST BIOPSY Bilateral    benign   BREAST EXCISIONAL BIOPSY Bilateral    benign   BREAST SURGERY     breast biopsy left , b/l excisional biopsy 1969   BUNIONECTOMY Bilateral    CESAREAN SECTION     Social History   Occupational History   Occupation: Engineer, civil (consulting)    Comment: retired  Tobacco Use   Smoking status: Former    Types: Cigarettes   Smokeless tobacco: Never  Vaping Use   Vaping Use: Never used  Substance and Sexual Activity   Alcohol use: Yes    Comment: occ   Drug use: Never   Sexual activity: Not  Currently

## 2021-03-05 ENCOUNTER — Encounter: Payer: Self-pay | Admitting: Podiatry

## 2021-03-05 ENCOUNTER — Ambulatory Visit: Payer: Medicare HMO | Admitting: Podiatry

## 2021-03-05 ENCOUNTER — Other Ambulatory Visit: Payer: Self-pay

## 2021-03-05 DIAGNOSIS — B351 Tinea unguium: Secondary | ICD-10-CM

## 2021-03-05 DIAGNOSIS — E119 Type 2 diabetes mellitus without complications: Secondary | ICD-10-CM | POA: Diagnosis not present

## 2021-03-05 DIAGNOSIS — M79674 Pain in right toe(s): Secondary | ICD-10-CM

## 2021-03-05 DIAGNOSIS — M79675 Pain in left toe(s): Secondary | ICD-10-CM | POA: Diagnosis not present

## 2021-03-05 NOTE — Progress Notes (Signed)
  Subjective:  Patient ID: Abigail Mcpherson, female    DOB: March 25, 1933,  MRN: [id number]  Chief Complaint  Patient presents with   Nail Problem    Nail trim    87 y.o. female returns for the above complaint.  Patient presents with thickened elongated dystrophic toenails x10.  Patient would like to have them to be done as she is not able to do so.  They are mildly painful to touch.  Painful when walking.  She is a type II diabetic with last A1c of 7.1.  She denies any other acute complaints.  Objective:  There were no vitals filed for this visit. Podiatric Exam: Vascular: dorsalis pedis and posterior tibial pulses are palpable bilateral. Capillary return is immediate. Temperature gradient is WNL. Skin turgor WNL  Sensorium: Diminished Semmes Weinstein monofilament test.  Diminished tactile sensation bilaterally. Nail Exam: Pt has thick disfigured discolored nails with subungual debris noted bilateral entire nail hallux through fifth toenails.  Pain on palpation to the nails. Ulcer Exam: There is no evidence of ulcer or pre-ulcerative changes or infection. Orthopedic Exam: Muscle tone and strength are WNL. No limitations in general ROM. No crepitus or effusions noted. HAV  B/L.  Hammer toes 2-5  B/L. Skin: No Porokeratosis. No infection or ulcers    Assessment & Plan:   1. Diabetes mellitus without complication (HCC)   2. Pain due to onychomycosis of toenails of both feet       Patient was evaluated and treated and all questions answered.  Hammertoe contractures bilateral 2 through 5 -I explained to patient the etiology of contractures and various treatment options were discussed.  Given the patient is an uncontrolled diabetic with last A1c of 7.1 I believe she will benefit from diabetic shoes to evenly distribute the pressure and offload the contractures therefore preventing ulcerations in future amputations.  Patient agrees with the plan. -She has not received her diabetic  shoes.  Onychomycosis with pain  -Nails palliatively debrided as below. -Educated on self-care  Procedure: Nail Debridement Rationale: pain  Type of Debridement: manual, sharp debridement. Instrumentation: Nail nipper, rotary burr. Number of Nails: 10  Procedures and Treatment: Consent by patient was obtained for treatment procedures. The patient understood the discussion of treatment and procedures well. All questions were answered thoroughly reviewed. Debridement of mycotic and hypertrophic toenails, 1 through 5 bilateral and clearing of subungual debris. No ulceration, no infection noted.  Return Visit-Office Procedure: Patient instructed to return to the office for a follow up visit 3 months for continued evaluation and treatment.  Nicholes Rough, DPM    No follow-ups on file.

## 2021-03-29 ENCOUNTER — Other Ambulatory Visit: Payer: Self-pay | Admitting: Family Medicine

## 2021-04-16 ENCOUNTER — Encounter: Payer: Self-pay | Admitting: Family Medicine

## 2021-04-16 ENCOUNTER — Telehealth (INDEPENDENT_AMBULATORY_CARE_PROVIDER_SITE_OTHER): Payer: Medicare HMO | Admitting: Family Medicine

## 2021-04-16 VITALS — BP 162/79 | Ht 62.0 in | Wt 153.0 lb

## 2021-04-16 DIAGNOSIS — B028 Zoster with other complications: Secondary | ICD-10-CM

## 2021-04-16 DIAGNOSIS — I10 Essential (primary) hypertension: Secondary | ICD-10-CM

## 2021-04-16 DIAGNOSIS — B029 Zoster without complications: Secondary | ICD-10-CM | POA: Insufficient documentation

## 2021-04-16 MED ORDER — VALACYCLOVIR HCL 1 G PO TABS: 1000.0000 mg | ORAL_TABLET | Freq: Three times a day (TID) | ORAL | 0 refills | Status: AC

## 2021-04-16 MED ORDER — GABAPENTIN 300 MG PO CAPS: 300.0000 mg | ORAL_CAPSULE | Freq: Three times a day (TID) | ORAL | 1 refills | Status: DC

## 2021-04-16 NOTE — Progress Notes (Signed)
Established Patient Office Visit  Subjective:  Patient ID: Abigail Mcpherson, female    DOB: December 21, 1932  Age: 87 y.o. MRN: [id number]  CC:  Chief Complaint  Patient presents with   Acute Visit    C/o having a rash under RT arm/breast/back x 4 days.  Is very painful.  She has been using Tylenol and cream with little relief.      HPI Abigail Mcpherson presents for evaluation and treatment of a rash on the left side of her chest that extends from her back on towards her breast.  The rash is burning and stinging.  She has tried Tylenol and a soothing cream with little relief.  Denies any antecedent pain.  Does not feel stressed.  Enjoyed a quiet Christmas with her family.  Distant history of zoster rash in the past many years ago.  Blood pressure has been elevated over the last few days running in the 150 range.  Past Medical History:  Diagnosis Date   Allergy    Cataract    Diabetes mellitus without complication (HCC)    GERD (gastroesophageal reflux disease)    History of shingles    Hyperlipidemia    Hypertension    Sleep apnea    cannot tolerate CPAP    Past Surgical History:  Procedure Laterality Date   APPENDECTOMY     BREAST BIOPSY Bilateral    benign   BREAST EXCISIONAL BIOPSY Bilateral    benign   BREAST SURGERY     breast biopsy left , b/l excisional biopsy 1969   BUNIONECTOMY Bilateral    CESAREAN SECTION      Family History  Problem Relation Age of Onset   Diabetes Mother    Hypertension Mother    Hernia Mother        inguinal   Diabetes Father    Stroke Son    Aneurysm Paternal Aunt        brain   Cancer Sister        intestinal   Hypertension Sister    Diabetes Sister    Glaucoma Sister    Diabetes Sister    Hypertension Sister    Hyperlipidemia Sister    Diabetes Sister    Hypertension Sister    Hyperlipidemia Sister    Hernia Sister        umbilical   Hyperlipidemia Sister    Hypertension Sister    Diabetes Sister    Alcohol abuse  Sister    Diabetes Sister    Hyperlipidemia Sister    Hypertension Sister    Obesity Sister    Stomach cancer Neg Hx    Colon cancer Neg Hx    Esophageal cancer Neg Hx    Colon polyps Neg Hx     Social History   Socioeconomic History   Marital status: Divorced    Spouse name: Not on file   Number of children: Not on file   Years of education: Not on file   Highest education level: Not on file  Occupational History   Occupation: Nurse    Comment: retired  Tobacco Use   Smoking status: Former    Types: Cigarettes   Smokeless tobacco: Never  Vaping Use   Vaping Use: Never used  Substance and Sexual Activity   Alcohol use: Yes    Comment: occ   Drug use: Never   Sexual activity: Not Currently  Other Topics Concern   Not on file  Social History Narrative   Avoids pork  and shellfish and deep sea fish. Lives with family, daughter, wears seat belt always. Divorced, retired Engineer, civil (consulting)   Social Determinants of Corporate investment banker Strain: Low Risk    Difficulty of Paying Living Expenses: Not hard at all  Food Insecurity: No Food Insecurity   Worried About Programme researcher, broadcasting/film/video in the Last Year: Never true   Barista in the Last Year: Never true  Transportation Needs: No Transportation Needs   Lack of Transportation (Medical): No   Lack of Transportation (Non-Medical): No  Physical Activity: Insufficiently Active   Days of Exercise per Week: 7 days   Minutes of Exercise per Session: 20 min  Stress: No Stress Concern Present   Feeling of Stress : Not at all  Social Connections: Socially Isolated   Frequency of Communication with Friends and Family: More than three times a week   Frequency of Social Gatherings with Friends and Family: More than three times a week   Attends Religious Services: Never   Database administrator or Organizations: No   Attends Engineer, structural: Never   Marital Status: Divorced  Catering manager Violence: Not At Risk   Fear  of Current or Ex-Partner: No   Emotionally Abused: No   Physically Abused: No   Sexually Abused: No    Outpatient Medications Prior to Visit  Medication Sig Dispense Refill   amLODipine (NORVASC) 2.5 MG tablet TAKE 1 TABLET BY MOUTH  DAILY 90 tablet 3   cetirizine (ZYRTEC) 10 MG tablet Take 1 tablet (10 mg total) by mouth daily. 30 tablet 11   famotidine (PEPCID) 40 MG tablet TAKE 1 TABLET BY MOUTH AT  BEDTIME 90 tablet 3   furosemide (LASIX) 20 MG tablet TAKE 1 TABLET BY MOUTH  DAILY AND A SECOND TABLET  DAILY AS NEEDED FOR PEDAL  EDEMA OR WEIGHT GAIN OF 3  LB IN 24 HOURS 180 tablet 3   hydrochlorothiazide (MICROZIDE) 12.5 MG capsule TAKE 1 CAPSULE BY MOUTH  DAILY 90 capsule 0   metFORMIN (GLUCOPHAGE-XR) 500 MG 24 hr tablet TAKE 2 TABLETS BY MOUTH IN  THE MORNING AND 1 TABLET BY MOUTH IN THE EVENING 270 tablet 3   montelukast (SINGULAIR) 10 MG tablet TAKE 1 TABLET BY MOUTH AT  BEDTIME AS NEEDED FOR  ALLERGIES 90 tablet 0   omeprazole (PRILOSEC) 40 MG capsule TAKE 1 CAPSULE BY MOUTH IN  THE MORNING AND AT BEDTIME 180 capsule 0   oxybutynin (DITROPAN-XL) 10 MG 24 hr tablet TAKE 1 TABLET(10 MG) BY MOUTH AT BEDTIME 30 tablet 2   potassium chloride SA (KLOR-CON) 20 MEQ tablet TAKE 1 TABLET BY MOUTH  DAILY 90 tablet 3   rosuvastatin (CRESTOR) 10 MG tablet TAKE 1 TABLET BY MOUTH  DAILY 90 tablet 0   tiZANidine (ZANAFLEX) 2 MG tablet Take 0.5-1 tablets (1-2 mg total) by mouth at bedtime as needed for muscle spasms. 30 tablet 1   albuterol (VENTOLIN HFA) 108 (90 Base) MCG/ACT inhaler USE 2 INHALATIONS BY MOUTH  EVERY 6 HOURS AS NEEDED FOR WHEEZING OR SHORTNESS OF  BREATH (Patient not taking: Reported on 04/16/2021) 54 g 1   benzonatate (TESSALON) 100 MG capsule TAKE 1 CAPSULE(100 MG) BY MOUTH THREE TIMES DAILY AS NEEDED FOR COUGH (Patient not taking: Reported on 04/16/2021) 30 capsule 1   nystatin cream (MYCOSTATIN) Apply 1 application topically 2 (two) times daily. (Patient not taking: Reported on  04/16/2021) 30 g 5   aspirin 81 MG tablet  Take 81 mg by mouth daily. Daily OTC (Patient not taking: Reported on 11/10/2020)     No facility-administered medications prior to visit.    Allergies  Allergen Reactions   Iodine Hives   Shellfish Allergy Rash    Vomiting  Deep sea fish    Codeine     tinnitus   Demerol [Meperidine Hcl]     hyperactivity    ROS Review of Systems  Constitutional:  Negative for diaphoresis, fatigue, fever and unexpected weight change.  HENT: Negative.    Respiratory: Negative.    Cardiovascular: Negative.   Gastrointestinal: Negative.   Skin:  Positive for rash. Negative for wound.  Neurological:  Negative for speech difficulty, weakness and headaches.  Psychiatric/Behavioral: Negative.  Negative for dysphoric mood. The patient is not nervous/anxious.      Objective:    Physical Exam Vitals and nursing note reviewed.  Constitutional:      Appearance: Normal appearance.  HENT:     Head: Normocephalic and atraumatic.  Eyes:     General: No scleral icterus.       Right eye: No discharge.        Left eye: No discharge.     Extraocular Movements: Extraocular movements intact.     Conjunctiva/sclera: Conjunctivae normal.  Pulmonary:     Effort: Pulmonary effort is normal.  Skin:      Neurological:     Mental Status: She is alert and oriented to person, place, and time.  Psychiatric:        Mood and Affect: Mood normal.        Behavior: Behavior normal.    BP (!) 162/79 Comment: pt reported   Ht 5\' 2"  (1.575 m)    Wt 153 lb (69.4 kg) Comment: pt reported   BMI 27.98 kg/m  Wt Readings from Last 3 Encounters:  04/16/21 153 lb (69.4 kg)  01/06/21 157 lb 3.2 oz (71.3 kg)  02/17/20 167 lb (75.8 kg)     Health Maintenance Due  Topic Date Due   Pneumonia Vaccine 30+ Years old (1 - PCV) Never done   FOOT EXAM  Never done   URINE MICROALBUMIN  Never done   TETANUS/TDAP  Never done   Zoster Vaccines- Shingrix (1 of 2) Never done   DEXA  SCAN  Never done   INFLUENZA VACCINE  Never done   COVID-19 Vaccine (5 - Booster for Moderna series) 11/20/2020   OPHTHALMOLOGY EXAM  11/25/2020    There are no preventive care reminders to display for this patient.  Lab Results  Component Value Date   TSH 1.04 11/05/2020   Lab Results  Component Value Date   WBC 6.5 11/05/2020   HGB 12.5 11/05/2020   HCT 38.3 11/05/2020   MCV 87.6 11/05/2020   PLT 229.0 11/05/2020   Lab Results  Component Value Date   NA 141 11/05/2020   K 3.9 11/05/2020   CO2 26 11/05/2020   GLUCOSE 127 (H) 11/05/2020   BUN 13 11/05/2020   CREATININE 0.81 11/05/2020   BILITOT 0.4 11/05/2020   ALKPHOS 92 11/05/2020   AST 15 11/05/2020   ALT 10 11/05/2020   PROT 7.0 11/05/2020   ALBUMIN 4.2 11/05/2020   CALCIUM 9.5 11/05/2020   GFR 65.95 11/05/2020   Lab Results  Component Value Date   CHOL 240 (H) 11/05/2020   Lab Results  Component Value Date   HDL 60.30 11/05/2020   Lab Results  Component Value Date   LDLCALC 147 (H)  11/05/2020   Lab Results  Component Value Date   TRIG 163.0 (H) 11/05/2020   Lab Results  Component Value Date   CHOLHDL 4 11/05/2020   Lab Results  Component Value Date   HGBA1C 6.6 (H) 11/05/2020      Assessment & Plan:   Problem List Items Addressed This Visit       Cardiovascular and Mediastinum   Primary hypertension     Other   Shingles - Primary   Relevant Medications   valACYclovir (VALTREX) 1000 MG tablet   gabapentin (NEURONTIN) 300 MG capsule    Meds ordered this encounter  Medications   valACYclovir (VALTREX) 1000 MG tablet    Sig: Take 1 tablet (1,000 mg total) by mouth 3 (three) times daily for 7 days.    Dispense:  21 tablet    Refill:  0   gabapentin (NEURONTIN) 300 MG capsule    Sig: Take 1 capsule (300 mg total) by mouth 3 (three) times daily. Drowsy precautions.    Dispense:  30 capsule    Refill:  1    Follow-up: No follow-ups on file.  Patient will start the Valtrex as soon  as it is dispensed and will take it 3 times daily for 7 days.  Asked her to try to take the Neurontin 3 times daily.  Drowsy precautions were given.  She assured me that she was not planning on leaving the house.  Follow-up next week if bp does not return to baseline.      Mliss Sax, MD  Virtual Visit via Video Note  I connected with Abigail Mcpherson on 04/16/21 at  4:15 PM EST by a video enabled telemedicine application and verified that I am speaking with the correct person using two identifiers.  Location: Patient: home with grandson.  Provider: work   I discussed the limitations of evaluation and management by telemedicine and the availability of in person appointments. The patient expressed understanding and agreed to proceed.  History of Present Illness:    Observations/Objective:   Assessment and Plan:   Follow Up Instructions:    I discussed the assessment and treatment plan with the patient. The patient was provided an opportunity to ask questions and all were answered. The patient agreed with the plan and demonstrated an understanding of the instructions.   The patient was advised to call back or seek an in-person evaluation if the symptoms worsen or if the condition fails to improve as anticipated.  I provided 25 minutes of non-face-to-face time during this encounter.   Mliss Sax, MD

## 2021-04-19 ENCOUNTER — Encounter: Payer: Self-pay | Admitting: Family Medicine

## 2021-04-19 DIAGNOSIS — Z8619 Personal history of other infectious and parasitic diseases: Secondary | ICD-10-CM

## 2021-04-21 MED ORDER — PREGABALIN 50 MG PO CAPS: 50.0000 mg | ORAL_CAPSULE | Freq: Three times a day (TID) | ORAL | 0 refills | Status: DC

## 2021-04-22 ENCOUNTER — Other Ambulatory Visit: Payer: Self-pay | Admitting: Family Medicine

## 2021-04-30 ENCOUNTER — Other Ambulatory Visit: Payer: Self-pay | Admitting: Family Medicine

## 2021-04-30 DIAGNOSIS — B028 Zoster with other complications: Secondary | ICD-10-CM

## 2021-05-05 ENCOUNTER — Other Ambulatory Visit: Payer: Self-pay | Admitting: Family Medicine

## 2021-05-05 DIAGNOSIS — Z8619 Personal history of other infectious and parasitic diseases: Secondary | ICD-10-CM

## 2021-05-10 LAB — HM DIABETES EYE EXAM

## 2021-05-12 ENCOUNTER — Telehealth: Payer: Self-pay | Admitting: Family Medicine

## 2021-05-12 ENCOUNTER — Other Ambulatory Visit: Payer: Self-pay | Admitting: Family Medicine

## 2021-05-12 DIAGNOSIS — R0789 Other chest pain: Secondary | ICD-10-CM

## 2021-05-12 DIAGNOSIS — R059 Cough, unspecified: Secondary | ICD-10-CM

## 2021-05-12 NOTE — Telephone Encounter (Signed)
Pt states that since she had the shingles when she coughs she has a pain and would like a x-ray ordered

## 2021-05-12 NOTE — Telephone Encounter (Signed)
Pt has a shingles follow up with Melissa next Wednesday. She stated she was supposed to get a chest xray but never did, and would like order put in so she can do that at her next appt. Please give pt a call when this is done.

## 2021-05-13 ENCOUNTER — Other Ambulatory Visit: Payer: Self-pay | Admitting: Family Medicine

## 2021-05-13 DIAGNOSIS — N3281 Overactive bladder: Secondary | ICD-10-CM

## 2021-05-13 NOTE — Telephone Encounter (Signed)
Lvm with information 

## 2021-05-19 ENCOUNTER — Encounter: Payer: Self-pay | Admitting: Orthopedic Surgery

## 2021-05-19 ENCOUNTER — Ambulatory Visit: Payer: Medicare HMO | Admitting: Family

## 2021-05-19 ENCOUNTER — Telehealth: Payer: Self-pay

## 2021-05-19 ENCOUNTER — Ambulatory Visit: Payer: Medicare HMO | Admitting: Orthopedic Surgery

## 2021-05-19 ENCOUNTER — Other Ambulatory Visit: Payer: Self-pay

## 2021-05-19 VITALS — BP 144/58 | HR 67 | Temp 98.3°F | Resp 16 | Wt 156.0 lb

## 2021-05-19 DIAGNOSIS — M19049 Primary osteoarthritis, unspecified hand: Secondary | ICD-10-CM | POA: Diagnosis not present

## 2021-05-19 DIAGNOSIS — B0229 Other postherpetic nervous system involvement: Secondary | ICD-10-CM | POA: Diagnosis not present

## 2021-05-19 DIAGNOSIS — M1712 Unilateral primary osteoarthritis, left knee: Secondary | ICD-10-CM

## 2021-05-19 DIAGNOSIS — M654 Radial styloid tenosynovitis [de Quervain]: Secondary | ICD-10-CM

## 2021-05-19 DIAGNOSIS — M1711 Unilateral primary osteoarthritis, right knee: Secondary | ICD-10-CM

## 2021-05-19 MED ORDER — VALACYCLOVIR HCL 1 G PO TABS: 1000.0000 mg | ORAL_TABLET | Freq: Three times a day (TID) | ORAL | 1 refills | Status: DC

## 2021-05-19 NOTE — Patient Instructions (Signed)
Please complete lab work prior to leaving. You can apply capsaicin cream to affected area as needed. Start Valtrex.

## 2021-05-19 NOTE — Telephone Encounter (Signed)
Noted  

## 2021-05-19 NOTE — Progress Notes (Signed)
Subjective:   By signing my name below, I, Zite Okoli, attest that this documentation has been prepared under the direction and in the presence of Sandford CrazeO'Sullivan, Somara Frymire, NP 05/19/2021    Patient ID: Abigail Mcpherson, female    DOB: 1932-09-02, 88 y.o.   MRN: [id number]  Chief Complaint  Patient presents with   Herpes Zoster    Here for shingles on back  and right torso    HPI Patient is in today for an office visit. She is accompanied by her daughter  Shingles- She reports the rash is still present on her back and the right side of her chest. They are still painful and the daughter reports the rash is not dissipating and it seems like there is a flare up every day. She uses the 50 mg lyrica at night because it makes her sleepy. She has not been using gabapentin consistently because she does not tolerate medication. Her daughter reports it is her second time having a shingles outbreak after 40+ years. She is requesting for a refill on 1000 mg valtrex.  Hands- She reports she has been experiencing pain in her hands, with the left being worse than the right. The pain is located at her thumbs and radiates toward the top of her fingers.  Past Medical History:  Diagnosis Date   Allergy    Cataract    Diabetes mellitus without complication (HCC)    GERD (gastroesophageal reflux disease)    History of shingles    Hyperlipidemia    Hypertension    Sleep apnea    cannot tolerate CPAP    Past Surgical History:  Procedure Laterality Date   APPENDECTOMY     BREAST BIOPSY Bilateral    benign   BREAST EXCISIONAL BIOPSY Bilateral    benign   BREAST SURGERY     breast biopsy left , b/l excisional biopsy 1969   BUNIONECTOMY Bilateral    CESAREAN SECTION      Family History  Problem Relation Age of Onset   Diabetes Mother    Hypertension Mother    Hernia Mother        inguinal   Diabetes Father    Stroke Son    Aneurysm Paternal Aunt        brain   Cancer Sister         intestinal   Hypertension Sister    Diabetes Sister    Glaucoma Sister    Diabetes Sister    Hypertension Sister    Hyperlipidemia Sister    Diabetes Sister    Hypertension Sister    Hyperlipidemia Sister    Hernia Sister        umbilical   Hyperlipidemia Sister    Hypertension Sister    Diabetes Sister    Alcohol abuse Sister    Diabetes Sister    Hyperlipidemia Sister    Hypertension Sister    Obesity Sister    Stomach cancer Neg Hx    Colon cancer Neg Hx    Esophageal cancer Neg Hx    Colon polyps Neg Hx     Social History   Socioeconomic History   Marital status: Divorced    Spouse name: Not on file   Number of children: Not on file   Years of education: Not on file   Highest education level: Not on file  Occupational History   Occupation: Nurse    Comment: retired  Tobacco Use   Smoking status: Former    Types:  Cigarettes   Smokeless tobacco: Never  Vaping Use   Vaping Use: Never used  Substance and Sexual Activity   Alcohol use: Yes    Comment: occ   Drug use: Never   Sexual activity: Not Currently  Other Topics Concern   Not on file  Social History Narrative   Avoids pork and shellfish and deep sea fish. Lives with family, daughter, wears seat belt always. Divorced, retired Engineer, civil (consulting)   Social Determinants of Corporate investment banker Strain: Low Risk    Difficulty of Paying Living Expenses: Not hard at all  Food Insecurity: No Food Insecurity   Worried About Programme researcher, broadcasting/film/video in the Last Year: Never true   Barista in the Last Year: Never true  Transportation Needs: No Transportation Needs   Lack of Transportation (Medical): No   Lack of Transportation (Non-Medical): No  Physical Activity: Insufficiently Active   Days of Exercise per Week: 7 days   Minutes of Exercise per Session: 20 min  Stress: No Stress Concern Present   Feeling of Stress : Not at all  Social Connections: Socially Isolated   Frequency of Communication with Friends  and Family: More than three times a week   Frequency of Social Gatherings with Friends and Family: More than three times a week   Attends Religious Services: Never   Database administrator or Organizations: No   Attends Engineer, structural: Never   Marital Status: Divorced  Catering manager Violence: Not At Risk   Fear of Current or Ex-Partner: No   Emotionally Abused: No   Physically Abused: No   Sexually Abused: No    Outpatient Medications Prior to Visit  Medication Sig Dispense Refill   furosemide (LASIX) 20 MG tablet TAKE 1 TABLET BY MOUTH  DAILY AND A SECOND TABLET  DAILY AS NEEDED FOR PEDAL  EDEMA OR WEIGHT GAIN OF 3  LB IN 24 HOURS 180 tablet 1   albuterol (VENTOLIN HFA) 108 (90 Base) MCG/ACT inhaler USE 2 INHALATIONS BY MOUTH  EVERY 6 HOURS AS NEEDED FOR WHEEZING OR SHORTNESS OF  BREATH (Patient not taking: Reported on 04/16/2021) 54 g 1   amLODipine (NORVASC) 2.5 MG tablet TAKE 1 TABLET BY MOUTH  DAILY 90 tablet 3   benzonatate (TESSALON) 100 MG capsule TAKE 1 CAPSULE(100 MG) BY MOUTH THREE TIMES DAILY AS NEEDED FOR COUGH (Patient not taking: Reported on 04/16/2021) 30 capsule 1   cetirizine (ZYRTEC) 10 MG tablet Take 1 tablet (10 mg total) by mouth daily. 30 tablet 11   famotidine (PEPCID) 40 MG tablet TAKE 1 TABLET BY MOUTH AT  BEDTIME 90 tablet 3   hydrochlorothiazide (MICROZIDE) 12.5 MG capsule TAKE 1 CAPSULE BY MOUTH  DAILY 90 capsule 0   metFORMIN (GLUCOPHAGE-XR) 500 MG 24 hr tablet TAKE 2 TABLETS BY MOUTH IN  THE MORNING AND 1 TABLET BY MOUTH IN THE EVENING 270 tablet 3   montelukast (SINGULAIR) 10 MG tablet TAKE 1 TABLET BY MOUTH AT  BEDTIME AS NEEDED FOR  ALLERGIES 90 tablet 0   nystatin cream (MYCOSTATIN) Apply 1 application topically 2 (two) times daily. (Patient not taking: Reported on 04/16/2021) 30 g 5   omeprazole (PRILOSEC) 40 MG capsule TAKE 1 CAPSULE BY MOUTH IN  THE MORNING AND AT BEDTIME 180 capsule 0   oxybutynin (DITROPAN-XL) 10 MG 24 hr tablet TAKE  1 TABLET(10 MG) BY MOUTH AT BEDTIME 30 tablet 2   potassium chloride SA (KLOR-CON) 20 MEQ tablet  TAKE 1 TABLET BY MOUTH  DAILY 90 tablet 3   pregabalin (LYRICA) 50 MG capsule TAKE 1 CAPSULE(50 MG) BY MOUTH THREE TIMES DAILY 30 capsule 2   rosuvastatin (CRESTOR) 10 MG tablet TAKE 1 TABLET BY MOUTH  DAILY 90 tablet 0   tiZANidine (ZANAFLEX) 2 MG tablet Take 0.5-1 tablets (1-2 mg total) by mouth at bedtime as needed for muscle spasms. 30 tablet 1   No facility-administered medications prior to visit.    Allergies  Allergen Reactions   Iodine Hives   Shellfish Allergy Rash    Vomiting  Deep sea fish    Codeine     tinnitus   Demerol [Meperidine Hcl]     hyperactivity    Review of Systems  Constitutional:  Negative for fever.  HENT:  Negative for ear pain and hearing loss.        (-)nystagmus (-)adenopathy  Eyes:  Negative for blurred vision.  Respiratory:  Positive for cough. Negative for shortness of breath and wheezing.   Cardiovascular:  Negative for chest pain and leg swelling.  Gastrointestinal:  Negative for blood in stool, diarrhea, nausea and vomiting.  Genitourinary:  Negative for dysuria and frequency.  Musculoskeletal:  Negative for joint pain and myalgias.       (+) hand pain   Skin:  Negative for rash.       (+) tender lesions  Neurological:  Negative for headaches.  Psychiatric/Behavioral:  Negative for depression. The patient is not nervous/anxious.       Objective:    Physical Exam Constitutional:      General: She is not in acute distress.    Appearance: Normal appearance. She is not ill-appearing.  HENT:     Head: Normocephalic and atraumatic.     Right Ear: External ear normal.     Left Ear: External ear normal.  Eyes:     Extraocular Movements: Extraocular movements intact.     Pupils: Pupils are equal, round, and reactive to light.  Cardiovascular:     Rate and Rhythm: Normal rate and regular rhythm.     Pulses: Normal pulses.     Heart sounds:  Normal heart sounds. No murmur heard. Pulmonary:     Effort: Pulmonary effort is normal. No respiratory distress.     Breath sounds: Normal breath sounds. No wheezing or rhonchi.  Abdominal:     General: Bowel sounds are normal. There is no distension.     Palpations: Abdomen is soft.     Tenderness: There is no abdominal tenderness. There is no guarding or rebound.  Musculoskeletal:     Cervical back: Neck supple.  Lymphadenopathy:     Cervical: No cervical adenopathy.  Skin:    General: Skin is warm and dry.     Comments: Several crusted lesions overlaying thoracic spine and right lateral rib cage  Neurological:     Mental Status: She is alert and oriented to person, place, and time.  Psychiatric:        Behavior: Behavior normal.        Judgment: Judgment normal.    BP (!) 144/58 (BP Location: Right Arm, Patient Position: Sitting, Cuff Size: Small)    Pulse 67    Temp 98.3 F (36.8 C) (Oral)    Resp 16    Wt 156 lb (70.8 kg)    SpO2 99%    BMI 28.53 kg/m  Wt Readings from Last 3 Encounters:  05/19/21 156 lb (70.8 kg)  04/16/21 153 lb (69.4  kg)  01/06/21 157 lb 3.2 oz (71.3 kg)    Diabetic Foot Exam - Simple   No data filed    Lab Results  Component Value Date   WBC 6.5 11/05/2020   HGB 12.5 11/05/2020   HCT 38.3 11/05/2020   PLT 229.0 11/05/2020   GLUCOSE 127 (H) 11/05/2020   CHOL 240 (H) 11/05/2020   TRIG 163.0 (H) 11/05/2020   HDL 60.30 11/05/2020   LDLCALC 147 (H) 11/05/2020   ALT 10 11/05/2020   AST 15 11/05/2020   NA 141 11/05/2020   K 3.9 11/05/2020   CL 105 11/05/2020   CREATININE 0.81 11/05/2020   BUN 13 11/05/2020   CO2 26 11/05/2020   TSH 1.04 11/05/2020   HGBA1C 6.6 (H) 11/05/2020    Lab Results  Component Value Date   TSH 1.04 11/05/2020   Lab Results  Component Value Date   WBC 6.5 11/05/2020   HGB 12.5 11/05/2020   HCT 38.3 11/05/2020   MCV 87.6 11/05/2020   PLT 229.0 11/05/2020   Lab Results  Component Value Date   NA 141  11/05/2020   K 3.9 11/05/2020   CO2 26 11/05/2020   GLUCOSE 127 (H) 11/05/2020   BUN 13 11/05/2020   CREATININE 0.81 11/05/2020   BILITOT 0.4 11/05/2020   ALKPHOS 92 11/05/2020   AST 15 11/05/2020   ALT 10 11/05/2020   PROT 7.0 11/05/2020   ALBUMIN 4.2 11/05/2020   CALCIUM 9.5 11/05/2020   GFR 65.95 11/05/2020   Lab Results  Component Value Date   CHOL 240 (H) 11/05/2020   Lab Results  Component Value Date   HDL 60.30 11/05/2020   Lab Results  Component Value Date   LDLCALC 147 (H) 11/05/2020   Lab Results  Component Value Date   TRIG 163.0 (H) 11/05/2020   Lab Results  Component Value Date   CHOLHDL 4 11/05/2020   Lab Results  Component Value Date   HGBA1C 6.6 (H) 11/05/2020       Assessment & Plan:   Problem List Items Addressed This Visit       Unprioritized   Post herpetic neuralgia - Primary    Having persistent lesions.  Will plan to retreat with valtrex until healed.  For pain, recommended topical capsaicin cream otc and lyrica prn.  Controlled substance contract is signed today and will obtain UDS.       Relevant Orders   DRUG MONITORING, PANEL 8 WITH CONFIRMATION, URINE   Hand arthritis    Recommended tylenol prn.  Monitor.        Meds ordered this encounter  Medications   valACYclovir (VALTREX) 1000 MG tablet    Sig: Take 1 tablet (1,000 mg total) by mouth 3 (three) times daily.    Dispense:  21 tablet    Refill:  1    Order Specific Question:   Supervising Provider    Answer:   Danise Edge A [[id number]]    I,Zite Okoli,acting as a scribe for Lemont Fillers, NP.,have documented all relevant documentation on the behalf of Lemont Fillers, NP,as directed by  Lemont Fillers, NP while in the presence of Lemont Fillers, NP.   I, Sandford Craze, NP, personally preformed the services described in this documentation.  All medical record entries made by the scribe were at my direction and in my presence.  I have reviewed  the chart and discharge instructions (if applicable) and agree that the record reflects my personal performance and  is accurate and complete. 05/19/2021

## 2021-05-19 NOTE — Telephone Encounter (Signed)
Can we get auth for left knee gel injection? 

## 2021-05-20 ENCOUNTER — Encounter: Payer: Self-pay | Admitting: Orthopedic Surgery

## 2021-05-20 DIAGNOSIS — B0229 Other postherpetic nervous system involvement: Secondary | ICD-10-CM | POA: Insufficient documentation

## 2021-05-20 DIAGNOSIS — M19049 Primary osteoarthritis, unspecified hand: Secondary | ICD-10-CM | POA: Insufficient documentation

## 2021-05-20 LAB — DRUG MONITORING, PANEL 8 WITH CONFIRMATION, URINE
6 Acetylmorphine: NEGATIVE ng/mL (ref <10)
Alcohol Metabolites: NEGATIVE ng/mL (ref <500)
Amphetamines: NEGATIVE ng/mL (ref <500)
Benzodiazepines: NEGATIVE ng/mL (ref <100)
Buprenorphine, Urine: NEGATIVE ng/mL (ref <5)
Cocaine Metabolite: NEGATIVE ng/mL (ref <150)
Creatinine: 131.9 mg/dL (ref >=20.0)
MDMA: NEGATIVE ng/mL (ref <500)
Marijuana Metabolite: NEGATIVE ng/mL (ref <20)
Opiates: NEGATIVE ng/mL (ref <100)
Oxidant: NEGATIVE ug/mL (ref <200)
Oxycodone: NEGATIVE ng/mL (ref <100)
pH: 5.9 (ref 4.5–9.0)

## 2021-05-20 LAB — DM TEMPLATE

## 2021-05-20 MED ORDER — BUPIVACAINE HCL 0.25 % IJ SOLN
4.0000 mL | INTRAMUSCULAR | Status: AC | PRN
  Administered 2021-05-19: 4 mL via INTRA_ARTICULAR

## 2021-05-20 MED ORDER — LIDOCAINE HCL 1 % IJ SOLN
5.0000 mL | INTRAMUSCULAR | Status: AC | PRN
  Administered 2021-05-19: 5 mL

## 2021-05-20 MED ORDER — METHYLPREDNISOLONE ACETATE 40 MG/ML IJ SUSP
40.0000 mg | INTRAMUSCULAR | Status: AC | PRN
  Administered 2021-05-19: 40 mg via INTRA_ARTICULAR

## 2021-05-20 NOTE — Progress Notes (Signed)
Office Visit Note   Patient: Abigail Mcpherson           Date of Birth: 1932/10/31           MRN: [id number] Visit Date: 05/19/2021 Requested by: Bradd CanaryBlyth, Stacey A, MD [address] [place],  [place] [id number] PCP: Bradd CanaryBlyth, Stacey A, MD  Subjective: Chief Complaint  Patient presents with   Left Knee - Follow-up    HPI: Abigail Mcpherson is a patient with left knee pain.  Has known history of left knee arthritis.  Uses a cane to ambulate.  She is here with her daughter today.  Notes are reviewed.  Left knee injection 02/15/2021 gave relief until about 3 weeks ago.  She is using Tylenol for pain.  She did develop shingles about a month ago and that has improved.  Primarily affected her thoracic region.  No recent injuries or falls.  Patient also describes left wrist pain which she localizes to the radial styloid.              ROS: All systems reviewed are negative as they relate to the chief complaint within the history of present illness.  Patient denies  fevers or chills.   Assessment & Plan: Visit Diagnoses:  1. Unilateral primary osteoarthritis, left knee   2. Unilateral primary osteoarthritis, right knee   3. De Quervain's tenosynovitis, left     Plan: Impression is left knee arthritis.  Cortisone injection performed today.  Not too much effusion in the knee which I think is a good sign.  We will preapproved her for gel injection on the left-hand side as well.  She is not intending to pursue any type of knee replacement.  Regarding the left wrist this looks like a combination of CMC arthritis but mostly de Quervain's tenosynovitis.  When this cortisone shot wears off in the left knee we will inject gel and then could also do ultrasound-guided injection into that first dorsal compartment.  I did discuss the risk with her about that which include but are not limited to infection tendon rupture but most likely the potential for some loss of pigmentation around the injection site.   Patient nonetheless would like to consider that option when she comes back for gel injection in the left knee.  Follow-Up Instructions: Return if symptoms worsen or fail to improve.   Orders:  No orders of the defined types were placed in this encounter.  No orders of the defined types were placed in this encounter.     Procedures: Large Joint Inj: L knee on 05/19/2021 6:47 AM Indications: diagnostic evaluation, joint swelling and pain Details: 18 G 1.5 in needle, superolateral approach  Arthrogram: No  Medications: 5 mL lidocaine 1 %; 40 mg methylPREDNISolone acetate 40 MG/ML; 4 mL bupivacaine 0.25 % Outcome: tolerated well, no immediate complications Procedure, treatment alternatives, risks and benefits explained, specific risks discussed. Consent was given by the patient. Immediately prior to procedure a time out was called to verify the correct patient, procedure, equipment, support staff and site/side marked as required. Patient was prepped and draped in the usual sterile fashion.      Clinical Data: No additional findings.  Objective: Vital Signs: There were no vitals taken for this visit.  Physical Exam:   Constitutional: Patient appears well-developed HEENT:  Head: Normocephalic Eyes:EOM are normal Neck: Normal range of motion Cardiovascular: Normal rate Pulmonary/chest: Effort normal Neurologic: Patient is alert Skin: Skin is warm Psychiatric: Patient has normal mood and  affect   Ortho Exam: Ortho exam demonstrates positive Finkelstein's test on the left wrist with positive tenderness over that radial styloid to direct palpation.  Wrist range of motion otherwise intact.  Mild pain at the [facility] joint with mildly positive axial grind test bilaterally.  EPL FPL interosseous strength in the hand intact.  Radial pulse intact.  Both knees are examined.  She has range of motion on the left of 5-90.  Extensor mechanism intact.  No groin pain with internal or external  rotation of the leg.  No other masses lymphadenopathy or skin changes noted in the left leg region.  This patient is diagnosed with osteoarthritis of the knee(s).    Radiographs show evidence of joint space narrowing, osteophytes, subchondral sclerosis and/or subchondral cysts.  This patient has knee pain which interferes with functional and activities of daily living.    This patient has experienced inadequate response, adverse effects and/or intolerance with conservative treatments such as acetaminophen, NSAIDS, topical creams, physical therapy or regular exercise, knee bracing and/or weight loss.   This patient has experienced inadequate response or has a contraindication to intra articular steroid injections for at least 3 months.   This patient is not scheduled to have a total knee replacement within 6 months of starting treatment with viscosupplementation.   Specialty Comments:  No specialty comments available.  Imaging: No results found.   PMFS History: Patient Active Problem List   Diagnosis Date Noted   Shingles 04/16/2021   Paresthesia of hand 11/11/2020   Insomnia 11/11/2020   Left foot pain 11/10/2020   Glaucoma 12/09/2019   Reflux esophagitis 12/09/2019   Pedal edema 08/21/2018   Urinary incontinence 05/29/2018   Knee pain, bilateral 05/29/2018   Allergy    History of shingles    Diabetes mellitus without complication (HCC)    Hyperlipidemia    Primary hypertension    Cataract    Past Medical History:  Diagnosis Date   Allergy    Cataract    Diabetes mellitus without complication (HCC)    GERD (gastroesophageal reflux disease)    History of shingles    Hyperlipidemia    Hypertension    Sleep apnea    cannot tolerate CPAP    Family History  Problem Relation Age of Onset   Diabetes Mother    Hypertension Mother    Hernia Mother        inguinal   Diabetes Father    Stroke Son    Aneurysm Paternal Aunt        brain   Cancer Sister         intestinal   Hypertension Sister    Diabetes Sister    Glaucoma Sister    Diabetes Sister    Hypertension Sister    Hyperlipidemia Sister    Diabetes Sister    Hypertension Sister    Hyperlipidemia Sister    Hernia Sister        umbilical   Hyperlipidemia Sister    Hypertension Sister    Diabetes Sister    Alcohol abuse Sister    Diabetes Sister    Hyperlipidemia Sister    Hypertension Sister    Obesity Sister    Stomach cancer Neg Hx    Colon cancer Neg Hx    Esophageal cancer Neg Hx    Colon polyps Neg Hx     Past Surgical History:  Procedure Laterality Date   APPENDECTOMY     BREAST BIOPSY Bilateral    benign  BREAST EXCISIONAL BIOPSY Bilateral    benign   BREAST SURGERY     breast biopsy left , b/l excisional biopsy 1969   BUNIONECTOMY Bilateral    CESAREAN SECTION     Social History   Occupational History   Occupation: Engineer, civil (consulting)    Comment: retired  Tobacco Use   Smoking status: Former    Types: Cigarettes   Smokeless tobacco: Never  Vaping Use   Vaping Use: Never used  Substance and Sexual Activity   Alcohol use: Yes    Comment: occ   Drug use: Never   Sexual activity: Not Currently

## 2021-05-20 NOTE — Assessment & Plan Note (Signed)
Having persistent lesions.  Will plan to retreat with valtrex until healed.  For pain, recommended topical capsaicin cream otc and lyrica prn.  Controlled substance contract is signed today and will obtain UDS.

## 2021-05-20 NOTE — Assessment & Plan Note (Signed)
Recommended tylenol prn.  Monitor.

## 2021-05-21 ENCOUNTER — Other Ambulatory Visit: Payer: Self-pay | Admitting: Family Medicine

## 2021-05-21 DIAGNOSIS — N3281 Overactive bladder: Secondary | ICD-10-CM

## 2021-06-09 ENCOUNTER — Other Ambulatory Visit: Payer: Self-pay

## 2021-06-09 ENCOUNTER — Ambulatory Visit: Payer: Medicare HMO | Admitting: Podiatry

## 2021-06-09 DIAGNOSIS — E119 Type 2 diabetes mellitus without complications: Secondary | ICD-10-CM | POA: Diagnosis not present

## 2021-06-09 DIAGNOSIS — M79675 Pain in left toe(s): Secondary | ICD-10-CM

## 2021-06-09 DIAGNOSIS — B351 Tinea unguium: Secondary | ICD-10-CM

## 2021-06-09 DIAGNOSIS — M79674 Pain in right toe(s): Secondary | ICD-10-CM

## 2021-06-11 NOTE — Progress Notes (Signed)
°  Subjective:  Patient ID: Abigail Mcpherson, female    DOB: 08-30-1932,  MRN: [id number]  Chief Complaint  Patient presents with   Nail Problem    Nail trim    88 y.o. female returns for the above complaint.  Patient presents with thickened elongated dystrophic toenails x10.  Patient would like to have them to be done as she is not able to do so.  They are mildly painful to touch.  Painful when walking.  She is a type II diabetic with last A1c of 7.1.  She denies any other acute complaints.  Objective:  There were no vitals filed for this visit. Podiatric Exam: Vascular: dorsalis pedis and posterior tibial pulses are palpable bilateral. Capillary return is immediate. Temperature gradient is WNL. Skin turgor WNL  Sensorium: Diminished Semmes Weinstein monofilament test.  Diminished tactile sensation bilaterally. Nail Exam: Pt has thick disfigured discolored nails with subungual debris noted bilateral entire nail hallux through fifth toenails.  Pain on palpation to the nails. Ulcer Exam: There is no evidence of ulcer or pre-ulcerative changes or infection. Orthopedic Exam: Muscle tone and strength are WNL. No limitations in general ROM. No crepitus or effusions noted. HAV  B/L.  Hammer toes 2-5  B/L. Skin: No Porokeratosis. No infection or ulcers    Assessment & Plan:   1. Pain due to onychomycosis of toenails of both feet   2. Diabetes mellitus without complication [facility])        Patient was evaluated and treated and all questions answered.  Hammertoe contractures bilateral 2 through 5 -I explained to patient the etiology of contractures and various treatment options were discussed.  Given the patient is an uncontrolled diabetic with last A1c of 7.1 I believe she will benefit from diabetic shoes to evenly distribute the pressure and offload the contractures therefore preventing ulcerations in future amputations.  Patient agrees with the plan. -She has not received her diabetic  shoes.  Onychomycosis with pain  -Nails palliatively debrided as below. -Educated on self-care  Procedure: Nail Debridement Rationale: pain  Type of Debridement: manual, sharp debridement. Instrumentation: Nail nipper, rotary burr. Number of Nails: 10  Procedures and Treatment: Consent by patient was obtained for treatment procedures. The patient understood the discussion of treatment and procedures well. All questions were answered thoroughly reviewed. Debridement of mycotic and hypertrophic toenails, 1 through 5 bilateral and clearing of subungual debris. No ulceration, no infection noted.  Return Visit-Office Procedure: Patient instructed to return to the office for a follow up visit 3 months for continued evaluation and treatment.  Nicholes Rough, DPM    No follow-ups on file.

## 2021-06-24 ENCOUNTER — Other Ambulatory Visit: Payer: Self-pay | Admitting: Family Medicine

## 2021-07-02 ENCOUNTER — Telehealth: Payer: Self-pay | Admitting: Podiatry

## 2021-07-02 NOTE — Telephone Encounter (Signed)
Patient daughter called , patient has blisters on bottom of foot , wants patient seen immediately.  Offered first available appointment with Dr Logan Bores on Monday, daughter refused.  Wants to speak with someone now.

## 2021-07-02 NOTE — Telephone Encounter (Signed)
Patient daughter called and stated her mother has multiple blister on her left foot and she needs to know how to treat them. She would like someone to call her back before we close today. Patient is diabetic  ? ?Please call ASAP ?

## 2021-07-02 NOTE — Telephone Encounter (Signed)
Call and spoke with patient and daughter. Patient stated she can barely walk due to having about 5 blisters on the ball of her foot that are extremely painful. Advised patient that our office is closed at this time and recommended patient go to urgent care to be evaluated. Patient and and daughter agreed that was the best plan of action at this time.  ?

## 2021-07-07 ENCOUNTER — Telehealth: Payer: Self-pay

## 2021-07-07 NOTE — Telephone Encounter (Signed)
BV pending for SynviscOne, left knee. ? ?

## 2021-07-09 ENCOUNTER — Encounter: Payer: Self-pay | Admitting: Podiatry

## 2021-07-14 ENCOUNTER — Telehealth: Payer: Self-pay

## 2021-07-14 NOTE — Telephone Encounter (Signed)
Faxed completed PA form to [facility] for SYnviscOne, left knee at [phone]. ?PA pending. ?

## 2021-07-15 ENCOUNTER — Other Ambulatory Visit: Payer: Self-pay | Admitting: Family Medicine

## 2021-07-16 ENCOUNTER — Telehealth: Payer: Self-pay

## 2021-07-16 NOTE — Telephone Encounter (Signed)
Called and left a Vm for patient to CB to schedule with Dr. August Saucer for gel injection. ? ?Approved for SynviscOne, left knee. ?Buy & Bill ?Covered at 100% after Co-pay ?Co-pay of $10.00- $20.00 ?PA Approval# [id number] ?Valid 07/14/2021- 10/14/2021 ?

## 2021-08-02 ENCOUNTER — Ambulatory Visit: Payer: Medicare HMO | Admitting: Family Medicine

## 2021-08-11 ENCOUNTER — Other Ambulatory Visit: Payer: Self-pay | Admitting: Family Medicine

## 2021-09-01 ENCOUNTER — Ambulatory Visit: Payer: Medicare HMO

## 2021-09-01 ENCOUNTER — Telehealth: Payer: Self-pay

## 2021-09-01 ENCOUNTER — Ambulatory Visit: Payer: Medicare HMO | Admitting: Orthopedic Surgery

## 2021-09-01 DIAGNOSIS — M654 Radial styloid tenosynovitis [de Quervain]: Secondary | ICD-10-CM

## 2021-09-01 DIAGNOSIS — M79642 Pain in left hand: Secondary | ICD-10-CM

## 2021-09-01 DIAGNOSIS — M1712 Unilateral primary osteoarthritis, left knee: Secondary | ICD-10-CM

## 2021-09-01 NOTE — Telephone Encounter (Signed)
Patient previously approved for gel injection. ?

## 2021-09-01 NOTE — Telephone Encounter (Signed)
Auth needed for left knee gel injection-patient had done approximately 3 months ago and is due soon ?

## 2021-09-04 ENCOUNTER — Encounter: Payer: Self-pay | Admitting: Orthopedic Surgery

## 2021-09-04 MED ORDER — METHYLPREDNISOLONE ACETATE 40 MG/ML IJ SUSP
40.0000 mg | INTRAMUSCULAR | Status: AC | PRN
  Administered 2021-09-01: 40 mg via INTRA_ARTICULAR

## 2021-09-04 MED ORDER — LIDOCAINE HCL 1 % IJ SOLN
5.0000 mL | INTRAMUSCULAR | Status: AC | PRN
  Administered 2021-09-01: 5 mL

## 2021-09-04 MED ORDER — BUPIVACAINE HCL 0.25 % IJ SOLN
4.0000 mL | INTRAMUSCULAR | Status: AC | PRN
  Administered 2021-09-01: 4 mL via INTRA_ARTICULAR

## 2021-09-04 NOTE — Progress Notes (Signed)
Office Visit Note   Patient: Abigail Mcpherson           Date of Birth: September 18, 1932           MRN: [id number] Visit Date: 09/01/2021 Requested by: Bradd CanaryBlyth, Stacey A, MD [address] [place] [place],  [place] [id number] PCP: Bradd CanaryBlyth, Stacey A, MD  Subjective: Chief Complaint  Patient presents with   Left Knee - Injections    HPI: Elease Hashimotoatricia is an 88 year old patient with known left knee arthritis.  She is walking with a cane.  She has lots of pain in the left knee.  Injections do help and last for about 3-1/2 to 4 months.  Last injection in February.  Would like to have cortisone injection today.  She also reports left wrist pain localizing to the [facility] joint about 20% but 80% is around the radial styloid.  Has pain with gripping as well as radial and ulnar deviation of the wrist.  Denies any history of trauma.  Does not want knee replacement.              ROS: All systems reviewed are negative as they relate to the chief complaint within the history of present illness.  Patient denies  fevers or chills.   Assessment & Plan: Visit Diagnoses:  1. Pain in left hand     Plan: Impression is left knee pain with known history of arthritis.  Left knee injection performed.  She also has de Quervain's tenosynovitis.  Ultrasound-guided injection performed in this region as well.  For symptomatic relief.  We will preapproved her for gel injections and she should come back when the shot wears off.  Anticipate around 3 to 4 months. This patient is diagnosed with osteoarthritis of the knee(s).    Radiographs show evidence of joint space narrowing, osteophytes, subchondral sclerosis and/or subchondral cysts.  This patient has knee pain which interferes with functional and activities of daily living.    This patient has experienced inadequate response, adverse effects and/or intolerance with conservative treatments such as acetaminophen, NSAIDS, topical creams, physical therapy or regular exercise, knee  bracing and/or weight loss.   This patient has experienced inadequate response or has a contraindication to intra articular steroid injections for at least 3 months.   This patient is not scheduled to have a total knee replacement within 6 months of starting treatment with viscosupplementation.   Follow-Up Instructions: Return if symptoms worsen or fail to improve.   Orders:  Orders Placed This Encounter  Procedures   US Guided Needle Placement - No Linked Charges   No orders of the defined types were placed in this encounter.     Procedures: Large Joint Inj: L knee on 09/01/2021 6:49 PM Indications: diagnostic evaluation, joint swelling and pain Details: 18 G 1.5 in needle, superolateral approach  Arthrogram: No  Medications: 5 mL lidocaine 1 %; 40 mg methylPREDNISolone acetate 40 MG/ML; 4 mL bupivacaine 0.25 % Outcome: tolerated well, no immediate complications Procedure, treatment alternatives, risks and benefits explained, specific risks discussed. Consent was given by the patient. Immediately prior to procedure a time out was called to verify the correct patient, procedure, equipment, support staff and site/side marked as required. Patient was prepped and draped in the usual sterile fashion.    Hand/UE Inj: L extensor compartment 1 for de Quervain's tenosynovitis on 09/01/2021 6:49 PM Indications: therapeutic Details: 25 G needle, ultrasound-guided radial approach Outcome: tolerated well, no immediate complications Procedure, treatment alternatives, risks and benefits explained,  specific risks discussed. Consent was given by the patient. Immediately prior to procedure a time out was called to verify the correct patient, procedure, equipment, support staff and site/side marked as required. Patient was prepped and draped in the usual sterile fashion.      Clinical Data: No additional findings.  Objective: Vital Signs: There were no vitals taken for this visit.  Physical  Exam:   Constitutional: Patient appears well-developed HEENT:  Head: Normocephalic Eyes:EOM are normal Neck: Normal range of motion Cardiovascular: Normal rate Pulmonary/chest: Effort normal Neurologic: Patient is alert Skin: Skin is warm Psychiatric: Patient has normal mood and affect   Ortho Exam: Ortho exam demonstrates full active and passive range of motion of the left wrist.  She does have positive Finkelstein's test more on the left than the right.  Does have some CMC crepitus with grinding but that does not elicit as much pain as Finkelstein's.  Does have a lot of tenderness to palpation around the first dorsal compartment.  Grip strength is intact.  Radial pulse intact.  Patient has bilateral varus alignment with no effusion.  Range of motion is around 5-90 5 to 100 degrees.  Specialty Comments:  No specialty comments available.  Imaging: No results found.   PMFS History: Patient Active Problem List   Diagnosis Date Noted   Post herpetic neuralgia 05/20/2021   Hand arthritis 05/20/2021   Shingles 04/16/2021   Paresthesia of hand 11/11/2020   Insomnia 11/11/2020   Left foot pain 11/10/2020   Glaucoma 12/09/2019   Reflux esophagitis 12/09/2019   Pedal edema 08/21/2018   Urinary incontinence 05/29/2018   Knee pain, bilateral 05/29/2018   Allergy    History of shingles    Diabetes mellitus without complication (HCC)    Hyperlipidemia    Primary hypertension    Cataract    Past Medical History:  Diagnosis Date   Allergy    Cataract    Diabetes mellitus without complication (HCC)    GERD (gastroesophageal reflux disease)    History of shingles    Hyperlipidemia    Hypertension    Sleep apnea    cannot tolerate CPAP    Family History  Problem Relation Age of Onset   Diabetes Mother    Hypertension Mother    Hernia Mother        inguinal   Diabetes Father    Stroke Son    Aneurysm Paternal Aunt        brain   Cancer Sister        intestinal    Hypertension Sister    Diabetes Sister    Glaucoma Sister    Diabetes Sister    Hypertension Sister    Hyperlipidemia Sister    Diabetes Sister    Hypertension Sister    Hyperlipidemia Sister    Hernia Sister        umbilical   Hyperlipidemia Sister    Hypertension Sister    Diabetes Sister    Alcohol abuse Sister    Diabetes Sister    Hyperlipidemia Sister    Hypertension Sister    Obesity Sister    Stomach cancer Neg Hx    Colon cancer Neg Hx    Esophageal cancer Neg Hx    Colon polyps Neg Hx     Past Surgical History:  Procedure Laterality Date   APPENDECTOMY     BREAST BIOPSY Bilateral    benign   BREAST EXCISIONAL BIOPSY Bilateral    benign   BREAST  SURGERY     breast biopsy left , b/l excisional biopsy 1969   BUNIONECTOMY Bilateral    CESAREAN SECTION     Social History   Occupational History   Occupation: Nurse    Comment: retired  Tobacco Use   Smoking status: Former    Types: Cigarettes   Smokeless tobacco: Never  Building services engineer Use: Never used  Substance and Sexual Activity   Alcohol use: Yes    Comment: occ   Drug use: Never   Sexual activity: Not Currently

## 2021-09-10 ENCOUNTER — Ambulatory Visit: Payer: Medicare HMO | Admitting: Podiatry

## 2021-09-10 DIAGNOSIS — M79675 Pain in left toe(s): Secondary | ICD-10-CM

## 2021-09-10 DIAGNOSIS — B351 Tinea unguium: Secondary | ICD-10-CM

## 2021-09-10 DIAGNOSIS — E119 Type 2 diabetes mellitus without complications: Secondary | ICD-10-CM

## 2021-09-10 DIAGNOSIS — M79674 Pain in right toe(s): Secondary | ICD-10-CM

## 2021-09-15 ENCOUNTER — Encounter: Payer: Self-pay | Admitting: Podiatry

## 2021-09-15 NOTE — Progress Notes (Signed)
  Subjective:  Patient ID: Abigail Mcpherson, female    DOB: [birth year],  MRN: [id number]  Chief Complaint  Patient presents with   Nail Problem    Nail trim    88 y.o. female returns for the above complaint.  Patient presents with thickened elongated dystrophic toenails x10.  Patient would like to have them to be done as she is not able to do so.  They are mildly painful to touch.  Painful when walking.  She is a type II diabetic with last A1c of 7.1.  She denies any other acute complaints.  Objective:  There were no vitals filed for this visit. Podiatric Exam: Vascular: dorsalis pedis and posterior tibial pulses are palpable bilateral. Capillary return is immediate. Temperature gradient is WNL. Skin turgor WNL  Sensorium: Diminished Semmes Weinstein monofilament test.  Diminished tactile sensation bilaterally. Nail Exam: Pt has thick disfigured discolored nails with subungual debris noted bilateral entire nail hallux through fifth toenails.  Pain on palpation to the nails. Ulcer Exam: There is no evidence of ulcer or pre-ulcerative changes or infection. Orthopedic Exam: Muscle tone and strength are WNL. No limitations in general ROM. No crepitus or effusions noted. HAV  B/L.  Hammer toes 2-5  B/L. Skin: No Porokeratosis. No infection or ulcers    Assessment & Plan:   No diagnosis found.      Patient was evaluated and treated and all questions answered.  Hammertoe contractures bilateral 2 through 5 -I explained to patient the etiology of contractures and various treatment options were discussed.  Given the patient is an uncontrolled diabetic with last A1c of 7.1 I believe she will benefit from diabetic shoes to evenly distribute the pressure and offload the contractures therefore preventing ulcerations in future amputations.  Patient agrees with the plan. -She has not received her diabetic shoes.  Onychomycosis with pain  -Nails palliatively debrided as below. -Educated on  self-care  Procedure: Nail Debridement Rationale: pain  Type of Debridement: manual, sharp debridement. Instrumentation: Nail nipper, rotary burr. Number of Nails: 10  Procedures and Treatment: Consent by patient was obtained for treatment procedures. The patient understood the discussion of treatment and procedures well. All questions were answered thoroughly reviewed. Debridement of mycotic and hypertrophic toenails, 1 through 5 bilateral and clearing of subungual debris. No ulceration, no infection noted.  Return Visit-Office Procedure: Patient instructed to return to the office for a follow up visit 3 months for continued evaluation and treatment.  Nicholes Rough, DPM    No follow-ups on file.

## 2021-10-04 ENCOUNTER — Encounter: Payer: Self-pay | Admitting: Surgical

## 2021-10-04 ENCOUNTER — Ambulatory Visit: Payer: Medicare HMO | Admitting: Surgical

## 2021-10-04 DIAGNOSIS — M1712 Unilateral primary osteoarthritis, left knee: Secondary | ICD-10-CM | POA: Diagnosis not present

## 2021-10-04 MED ORDER — HYLAN G-F 20 48 MG/6ML IX SOSY
48.0000 mg | PREFILLED_SYRINGE | INTRA_ARTICULAR | Status: AC | PRN
  Administered 2021-10-04: 48 mg via INTRA_ARTICULAR

## 2021-10-04 MED ORDER — LIDOCAINE HCL 1 % IJ SOLN
5.0000 mL | INTRAMUSCULAR | Status: AC | PRN
  Administered 2021-10-04: 5 mL

## 2021-10-04 NOTE — Progress Notes (Signed)
   Procedure Note  Patient: Abigail Mcpherson             Date of Birth: [birth year]           MRN: [id number]             Visit Date: 10/04/2021  Procedures: Visit Diagnoses:  1. Unilateral primary osteoarthritis, left knee     Large Joint Inj: L knee on 10/04/2021 3:53 PM Indications: pain, joint swelling and diagnostic evaluation Details: 18 G 1.5 in needle, superolateral approach  Arthrogram: No  Medications: 5 mL lidocaine 1 %; 48 mg Hylan 48 MG/6ML Outcome: tolerated well, no immediate complications Procedure, treatment alternatives, risks and benefits explained, specific risks discussed. Consent was given by the patient. Immediately prior to procedure a time out was called to verify the correct patient, procedure, equipment, support staff and site/side marked as required. Patient was prepped and draped in the usual sterile fashion.

## 2021-10-06 ENCOUNTER — Other Ambulatory Visit: Payer: Self-pay | Admitting: Family Medicine

## 2021-11-20 ENCOUNTER — Encounter: Payer: Self-pay | Admitting: Family Medicine

## 2021-11-23 ENCOUNTER — Ambulatory Visit: Payer: Medicare HMO | Admitting: Family Medicine

## 2021-11-24 ENCOUNTER — Encounter: Payer: Self-pay | Admitting: Family Medicine

## 2021-11-24 ENCOUNTER — Ambulatory Visit (INDEPENDENT_AMBULATORY_CARE_PROVIDER_SITE_OTHER): Payer: Medicare HMO | Admitting: Family Medicine

## 2021-11-24 ENCOUNTER — Ambulatory Visit: Payer: Medicare HMO | Admitting: Family Medicine

## 2021-11-24 VITALS — BP 136/45 | HR 72 | Ht 62.0 in | Wt 154.8 lb

## 2021-11-24 DIAGNOSIS — N6311 Unspecified lump in the right breast, upper outer quadrant: Secondary | ICD-10-CM | POA: Diagnosis not present

## 2021-11-24 MED ORDER — ALBUTEROL SULFATE HFA 108 (90 BASE) MCG/ACT IN AERS: INHALATION_SPRAY | RESPIRATORY_TRACT | 1 refills | Status: DC

## 2021-11-24 MED ORDER — BENZONATATE 100 MG PO CAPS: ORAL_CAPSULE | ORAL | 1 refills | Status: DC

## 2021-11-24 NOTE — Progress Notes (Signed)
   Acute Office Visit  Subjective:     Patient ID: Abigail Mcpherson, female    DOB: November 01, 1932, 88 y.o.   MRN: [id number]  CC: breast lump   HPI Patient is in today for breast lump.   Patient reports she noticed a small lump in her right breast on 11/16/21. She has not had any pain, swelling, skin changes, bruising, erythema, etc. Reports history of bilateral biopsies in the past, but all normal. States she has "lumpy breasts" - fibrous tissue.   She and daughter also mentioned intermittent cough she will get for weeks at a time. They thought maybe allergic (she has been on Allegra for many years). Daughter also wondered if related to her dietary pattern and reflux - unsure if she has been taking her reflux meds as prescribed. Additionally, they recently changed some of her vitamins lately and feel like that made a difference, so they are planning to do an elimination trial to find any triggers. She denies any sputum production, fever, chills, body aches, chest pain, dyspnea, wheezing, unexplained weight loss.     ROS All review of systems negative except what is listed in the HPI      Objective:    BP (!) 136/45   Pulse 72   Ht 5\' 2"  (1.575 m)   Wt 154 lb 12.8 oz (70.2 kg)   BMI 28.31 kg/m    Physical Exam Vitals reviewed.  Constitutional:      Appearance: Normal appearance.  Chest:    Skin:    General: Skin is warm and dry.     Findings: No bruising, erythema or lesion.  Neurological:     General: No focal deficit present.     Mental Status: She is alert and oriented to person, place, and time. Mental status is at baseline.  Psychiatric:        Mood and Affect: Mood normal.        Behavior: Behavior normal.        Thought Content: Thought content normal.        Judgment: Judgment normal.     No results found for any visits on 11/24/21.      Assessment & Plan:   1. Mass of upper outer quadrant of right breast Patient would like to go ahead with diagnostic  mammogram. Order placed. Patient aware of signs/symptoms requiring further/urgent evaluation.  - MM DIAG BREAST TOMO UNI RIGHT; Future   2. Cough They are going to do a trial of removing and slowly adding back her vitamins/supplements because they think that might be a cause. Also discussed changing antihistamines since she has been on Allegra for awhile now. Reminded her of timing of reflux medications and reflux precautions. Patient aware of signs/symptoms requiring further/urgent evaluation. Please contact office for follow-up if symptoms do not improve or worsen. Seek emergency care if symptoms become severe. Consider chest x-ray if not improving with measures discussed.     Return if symptoms worsen or fail to improve.  01/24/22, NP

## 2021-11-24 NOTE — Progress Notes (Signed)
Found on the 1st on right breast Not painful

## 2021-11-25 ENCOUNTER — Other Ambulatory Visit: Payer: Self-pay | Admitting: Family Medicine

## 2021-11-25 DIAGNOSIS — N6311 Unspecified lump in the right breast, upper outer quadrant: Secondary | ICD-10-CM

## 2021-12-08 ENCOUNTER — Other Ambulatory Visit: Payer: Self-pay | Admitting: Family Medicine

## 2021-12-08 ENCOUNTER — Ambulatory Visit
Admission: RE | Admit: 2021-12-08 | Discharge: 2021-12-08 | Disposition: A | Discharge status: Home / Self Care | Payer: Medicare HMO | Source: Ambulatory Visit | Attending: Family Medicine | Admitting: Family Medicine

## 2021-12-08 ENCOUNTER — Ambulatory Visit: Payer: Medicare HMO

## 2021-12-08 DIAGNOSIS — N6311 Unspecified lump in the right breast, upper outer quadrant: Secondary | ICD-10-CM

## 2021-12-13 LAB — HM DIABETES EYE EXAM

## 2021-12-15 ENCOUNTER — Ambulatory Visit: Payer: Medicare HMO | Admitting: Podiatry

## 2021-12-15 DIAGNOSIS — E119 Type 2 diabetes mellitus without complications: Secondary | ICD-10-CM | POA: Diagnosis not present

## 2021-12-15 DIAGNOSIS — M792 Neuralgia and neuritis, unspecified: Secondary | ICD-10-CM

## 2021-12-15 DIAGNOSIS — B351 Tinea unguium: Secondary | ICD-10-CM | POA: Diagnosis not present

## 2021-12-15 DIAGNOSIS — M79675 Pain in left toe(s): Secondary | ICD-10-CM | POA: Diagnosis not present

## 2021-12-15 DIAGNOSIS — M79674 Pain in right toe(s): Secondary | ICD-10-CM

## 2021-12-15 MED ORDER — GABAPENTIN 100 MG PO CAPS: 100.0000 mg | ORAL_CAPSULE | Freq: Three times a day (TID) | ORAL | 3 refills | Status: DC

## 2021-12-15 NOTE — Progress Notes (Signed)
  Subjective:  Patient ID: Abigail Mcpherson, female    DOB: Mar 31, 1933,  MRN: [id number]  Chief Complaint  Patient presents with   Nail Problem    Nail trim    88 y.o. female returns for the above complaint.  Patient presents with thickened elongated dystrophic toenails x10.  Patient would like to have them to be done as she is not able to do so.  They are mildly painful to touch.  Painful when walking.  She is a type II diabetic with last A1c of 7.1.  She has secondary complaint of neuropathic pain.  She states is a burning sensation to the left foot mostly.  It could likely be coming from lower back.  She has not had it examined.  Objective:  There were no vitals filed for this visit. Podiatric Exam: Vascular: dorsalis pedis and posterior tibial pulses are palpable bilateral. Capillary return is immediate. Temperature gradient is WNL. Skin turgor WNL  Sensorium: Diminished Semmes Weinstein monofilament test.  Diminished tactile sensation bilaterally. Nail Exam: Pt has thick disfigured discolored nails with subungual debris noted bilateral entire nail hallux through fifth toenails.  Pain on palpation to the nails. Ulcer Exam: There is no evidence of ulcer or pre-ulcerative changes or infection. Orthopedic Exam: Muscle tone and strength are WNL. No limitations in general ROM. No crepitus or effusions noted. HAV  B/L.  Hammer toes 2-5  B/L. Skin: No Porokeratosis. No infection or ulcers    Assessment & Plan:   No diagnosis found.      Patient was evaluated and treated and all questions answered.  Neuropathic pain likely due to lower back pain -I explained the patient the etiology of neuropathy emergency room and options were discussed.  Given the amount of pain that she is having she will benefit from gabapentin.  Gabapentin was sent to the pharmacy.  I have asked her to to start taking it.  She states understanding.  If it continues to benefit her I discussed with her that her  primary care physician can give her more   Onychomycosis with pain  -Nails palliatively debrided as below. -Educated on self-care  Procedure: Nail Debridement Rationale: pain  Type of Debridement: manual, sharp debridement. Instrumentation: Nail nipper, rotary burr. Number of Nails: 10  Procedures and Treatment: Consent by patient was obtained for treatment procedures. The patient understood the discussion of treatment and procedures well. All questions were answered thoroughly reviewed. Debridement of mycotic and hypertrophic toenails, 1 through 5 bilateral and clearing of subungual debris. No ulceration, no infection noted.  Return Visit-Office Procedure: Patient instructed to return to the office for a follow up visit 3 months for continued evaluation and treatment.  Nicholes Rough, DPM    No follow-ups on file.

## 2021-12-31 ENCOUNTER — Other Ambulatory Visit: Payer: Self-pay | Admitting: Family Medicine

## 2022-01-06 ENCOUNTER — Encounter: Payer: Medicare HMO | Admitting: Family Medicine

## 2022-01-14 ENCOUNTER — Ambulatory Visit: Payer: Medicare HMO | Admitting: Surgical

## 2022-01-17 DIAGNOSIS — Z0000 Encounter for general adult medical examination without abnormal findings: Secondary | ICD-10-CM | POA: Insufficient documentation

## 2022-01-17 NOTE — Assessment & Plan Note (Signed)
Well controlled, no changes to meds. Encouraged heart healthy diet such as the DASH diet and exercise as tolerated.  °

## 2022-01-17 NOTE — Assessment & Plan Note (Signed)
hgba1c acceptable, minimize simple carbs. Increase exercise as tolerated. Continue current meds 

## 2022-01-17 NOTE — Assessment & Plan Note (Signed)
Encourage heart healthy diet such as MIND or DASH diet, increase exercise, avoid trans fats, simple carbohydrates and processed foods, consider a krill or fish or flaxseed oil cap daily. Tolerating statin 

## 2022-01-17 NOTE — Assessment & Plan Note (Addendum)
Patient encouraged to maintain heart healthy diet, regular exercise, adequate sleep. Consider daily probiotics. Take medications as prescribed. Labs ordered and reviewed.  Has aged out of screening colonoscopy, MGM and paps Consider Dexa scan Consider RSV (respiratory syncitial virus) vaccine at pharmacy, Arexvy Covid booster  new version At pharmacy High dose flu shot mid Sept and on  Shingrix is the new shingles shot, 2 shots over 2-6 months, confirm coverage with insurance and document, then can return here for shots with nurse appt or at pharmacy Given Prevnar 20 today

## 2022-01-18 ENCOUNTER — Ambulatory Visit (INDEPENDENT_AMBULATORY_CARE_PROVIDER_SITE_OTHER): Payer: Medicare HMO | Admitting: Family Medicine

## 2022-01-18 VITALS — BP 136/74 | HR 70 | Temp 98.0°F | Resp 16 | Ht 62.0 in | Wt 152.8 lb

## 2022-01-18 DIAGNOSIS — E785 Hyperlipidemia, unspecified: Secondary | ICD-10-CM | POA: Diagnosis not present

## 2022-01-18 DIAGNOSIS — E119 Type 2 diabetes mellitus without complications: Secondary | ICD-10-CM

## 2022-01-18 DIAGNOSIS — I10 Essential (primary) hypertension: Secondary | ICD-10-CM

## 2022-01-18 DIAGNOSIS — Z0000 Encounter for general adult medical examination without abnormal findings: Secondary | ICD-10-CM

## 2022-01-18 DIAGNOSIS — Z23 Encounter for immunization: Secondary | ICD-10-CM

## 2022-01-18 LAB — COMPREHENSIVE METABOLIC PANEL
ALT: 10 U/L (ref 0–35)
AST: 14 U/L (ref 0–37)
Albumin: 4.7 g/dL (ref 3.5–5.2)
Alkaline Phosphatase: 112 U/L (ref 39–117)
BUN: 22 mg/dL (ref 6–23)
CO2: 30 mEq/L (ref 19–32)
Calcium: 10 mg/dL (ref 8.4–10.5)
Chloride: 101 mEq/L (ref 96–112)
Creatinine, Ser: 0.88 mg/dL (ref 0.40–1.20)
GFR: 59.21 mL/min — ABNORMAL LOW (ref >60.00)
Glucose, Bld: 120 mg/dL — ABNORMAL HIGH (ref 70–99)
Potassium: 4.3 mEq/L (ref 3.5–5.1)
Sodium: 142 mEq/L (ref 135–145)
Total Bilirubin: 0.3 mg/dL (ref 0.2–1.2)
Total Protein: 7.6 g/dL (ref 6.0–8.3)

## 2022-01-18 LAB — CBC
HCT: 41.8 % (ref 36.0–46.0)
Hemoglobin: 13.2 g/dL (ref 12.0–15.0)
MCHC: 31.7 g/dL (ref 30.0–36.0)
MCV: 86.8 fl (ref 78.0–100.0)
Platelets: 226 10*3/uL (ref 150.0–400.0)
RBC: 4.82 Mil/uL (ref 3.87–5.11)
RDW: 13.9 % (ref 11.5–15.5)
WBC: 6.9 10*3/uL (ref 4.0–10.5)

## 2022-01-18 LAB — LIPID PANEL
Cholesterol: 170 mg/dL (ref 0–200)
HDL: 68.4 mg/dL (ref >39.00)
LDL Cholesterol: 80 mg/dL (ref 0–99)
NonHDL: 102.05
Total CHOL/HDL Ratio: 2
Triglycerides: 111 mg/dL (ref 0.0–149.0)
VLDL: 22.2 mg/dL (ref 0.0–40.0)

## 2022-01-18 LAB — HEMOGLOBIN A1C: Hgb A1c MFr Bld: 7 % — ABNORMAL HIGH (ref 4.6–6.5)

## 2022-01-18 LAB — TSH: TSH: 0.93 u[IU]/mL (ref 0.35–5.50)

## 2022-01-18 NOTE — Progress Notes (Signed)
Subjective:   By signing my name below, I, Abigail Mcpherson, attest that this documentation has been prepared under the direction and in the presence of Abigail Canary, MD 01/18/2022.     Patient ID: Abigail Mcpherson, female    DOB: 05/14/1932, 88 y.o.   MRN: [id number]  Chief Complaint  Patient presents with   Annual Exam    Here for Cpe   HPI Patient is in today for a comprehensive physical exam and follow up on chronic medical concerns.   She is doing well today and her daughter is with her also speaking on her behalf.   She denies having any fever, chills, ear pain, headaches, muscle pain, joint pain, new moles, rash, itching, congestion, sinus pain, sore throat, chest pain, palpitations, wheezing, nausea, vomitting, abdominal pain, diarrhea, constipation, blood in stool, dysuria, urgency, frequency and hematuria.  Family history: She denies any changes to her family history.   Immunizations: She has been informed about receiving COVID-19, high-dose Flu, Pneumonia, RSV, Shingles, and Tetanus immunizations. She will receive the Pneumonia immunization today.   Diet: She has been informed about maintaining a balanced diet and remaining hydrated.   Past Medical History:  Diagnosis Date   Allergy    Cataract    Diabetes mellitus without complication (HCC)    GERD (gastroesophageal reflux disease)    History of shingles    Hyperlipidemia    Hypertension    Sleep apnea    cannot tolerate CPAP   Past Surgical History:  Procedure Laterality Date   APPENDECTOMY     BREAST BIOPSY Bilateral    benign   BREAST EXCISIONAL BIOPSY Bilateral    benign   BREAST SURGERY     breast biopsy left , b/l excisional biopsy 1969   BUNIONECTOMY Bilateral    CESAREAN SECTION     Family History  Problem Relation Age of Onset   Diabetes Mother    Hypertension Mother    Hernia Mother        inguinal   Diabetes Father    Stroke Son    Aneurysm Paternal Aunt        brain   Cancer Sister         intestinal   Hypertension Sister    Diabetes Sister    Glaucoma Sister    Diabetes Sister    Hypertension Sister    Hyperlipidemia Sister    Diabetes Sister    Hypertension Sister    Hyperlipidemia Sister    Hernia Sister        umbilical   Hyperlipidemia Sister    Hypertension Sister    Diabetes Sister    Alcohol abuse Sister    Diabetes Sister    Hyperlipidemia Sister    Hypertension Sister    Obesity Sister    Stomach cancer Neg Hx    Colon cancer Neg Hx    Esophageal cancer Neg Hx    Colon polyps Neg Hx    Social History   Socioeconomic History   Marital status: Divorced    Spouse name: Not on file   Number of children: Not on file   Years of education: Not on file   Highest education level: Not on file  Occupational History   Occupation: Nurse    Comment: retired  Tobacco Use   Smoking status: Former    Types: Cigarettes   Smokeless tobacco: Never  Vaping Use   Vaping Use: Never used  Substance and Sexual Activity   Alcohol  use: Yes    Comment: occ   Drug use: Never   Sexual activity: Not Currently  Other Topics Concern   Not on file  Social History Narrative   Avoids pork and shellfish and deep sea fish. Lives with family, daughter, wears seat belt always. Divorced, retired Marine scientist   Social Determinants of Radio broadcast assistant Strain: [place]  (01/06/2021)   Overall Financial Resource Strain (CARDIA)    Difficulty of Paying Living Expenses: Not hard at all  Food Insecurity: No Food Insecurity (01/06/2021)   Hunger Vital Sign    Worried About Running Out of Food in the Last Year: Never true    Ran Out of Food in the Last Year: Never true  Transportation Needs: No Transportation Needs (01/06/2021)   PRAPARE - Hydrologist (Medical): No    Lack of Transportation (Non-Medical): No  Physical Activity: Insufficiently Active (01/06/2021)   Exercise Vital Sign    Days of Exercise per Week: 7 days    Minutes of  Exercise per Session: 20 min  Stress: No Stress Concern Present (01/06/2021)   [place]    Feeling of Stress : Not at all  Social Connections: Socially Isolated (01/06/2021)   Social Connection and Isolation Panel [NHANES]    Frequency of Communication with Friends and Family: More than three times a week    Frequency of Social Gatherings with Friends and Family: More than three times a week    Attends Religious Services: Never    Marine scientist or Organizations: No    Attends Archivist Meetings: Never    Marital Status: Divorced  Human resources officer Violence: Not At Risk (01/06/2021)   Humiliation, Afraid, Rape, and Kick questionnaire    Fear of Current or Ex-Partner: No    Emotionally Abused: No    Physically Abused: No    Sexually Abused: No   Outpatient Medications Prior to Visit  Medication Sig Dispense Refill   albuterol (VENTOLIN HFA) 108 (90 Base) MCG/ACT inhaler USE 2 INHALATIONS BY MOUTH  EVERY 6 HOURS AS NEEDED FOR WHEEZING OR SHORTNESS OF  BREATH 54 g 1   amLODipine (NORVASC) 2.5 MG tablet TAKE 1 TABLET BY MOUTH DAILY 90 tablet 3   benzonatate (TESSALON) 100 MG capsule TAKE 1 CAPSULE(100 MG) BY MOUTH THREE TIMES DAILY AS NEEDED FOR COUGH 30 capsule 1   cetirizine (ZYRTEC) 10 MG tablet Take 1 tablet (10 mg total) by mouth daily. 30 tablet 11   famotidine (PEPCID) 40 MG tablet Take 1 tablet (40 mg total) by mouth at bedtime. 90 tablet 0   furosemide (LASIX) 20 MG tablet TAKE 1 TABLET BY MOUTH DAILY AND A SECOND TABLET DAILY AS NEEDED  FOR PEDAL EDEMA OR WEIGHT GAIN  OF 3 LB IN 24 HOURS 180 tablet 3   gabapentin (NEURONTIN) 100 MG capsule Take 1 capsule (100 mg total) by mouth 3 (three) times daily. 90 capsule 3   hydrochlorothiazide (MICROZIDE) 12.5 MG capsule TAKE 1 CAPSULE BY MOUTH DAILY 90 capsule 3   metFORMIN (GLUCOPHAGE-XR) 500 MG 24 hr tablet TAKE 2 TABLETS BY MOUTH IN  THE MORNING AND 1  TABLET BY MOUTH IN THE EVENING 270 tablet 3   montelukast (SINGULAIR) 10 MG tablet TAKE 1 TABLET BY MOUTH AT  BEDTIME AS NEEDED FOR ALLERGIES 90 tablet 3   omeprazole (PRILOSEC) 40 MG capsule TAKE 1 CAPSULE BY MOUTH IN THE  MORNING  AND AT BEDTIME 180 capsule 3   oxybutynin (DITROPAN-XL) 10 MG 24 hr tablet TAKE 1 TABLET(10 MG) BY MOUTH AT BEDTIME 30 tablet 2   potassium chloride SA (KLOR-CON M) 20 MEQ tablet TAKE 1 TABLET BY MOUTH DAILY 90 tablet 3   pregabalin (LYRICA) 50 MG capsule TAKE 1 CAPSULE(50 MG) BY MOUTH THREE TIMES DAILY 30 capsule 2   rosuvastatin (CRESTOR) 10 MG tablet TAKE 1 TABLET BY MOUTH DAILY 90 tablet 3   tiZANidine (ZANAFLEX) 2 MG tablet Take 0.5-1 tablets (1-2 mg total) by mouth at bedtime as needed for muscle spasms. 30 tablet 1   valACYclovir (VALTREX) 1000 MG tablet Take 1 tablet (1,000 mg total) by mouth 3 (three) times daily. 21 tablet 1   No facility-administered medications prior to visit.   Allergies  Allergen Reactions   Iodine Hives   Shellfish Allergy Rash    Vomiting  Deep sea fish    Codeine     tinnitus   Demerol [Meperidine Hcl]     hyperactivity   Review of Systems  Constitutional:  Negative for chills and fever.  HENT:  Negative for congestion, ear pain, sinus pain and sore throat.   Respiratory:  Negative for cough, shortness of breath and wheezing.   Cardiovascular:  Negative for chest pain and palpitations.  Gastrointestinal:  Negative for abdominal pain, blood in stool, constipation, diarrhea, nausea and vomiting.  Genitourinary:  Negative for dysuria, frequency, hematuria and urgency.  Musculoskeletal:  Negative for joint pain and myalgias.  Skin:  Negative for itching and rash.       (-) New moles.  Neurological:  Negative for headaches.      Objective:    Physical Exam Constitutional:      General: She is not in acute distress.    Appearance: Normal appearance. She is not ill-appearing.  HENT:     Head: Normocephalic and  atraumatic.     Right Ear: Tympanic membrane, ear canal and external ear normal.     Left Ear: Tympanic membrane, ear canal and external ear normal.     Mouth/Throat:     Mouth: Mucous membranes are moist.     Pharynx: Oropharynx is clear.  Eyes:     Extraocular Movements: Extraocular movements intact.     Right eye: No nystagmus.     Left eye: No nystagmus.     Pupils: Pupils are equal, round, and reactive to light.  Neck:     Vascular: No carotid bruit.  Cardiovascular:     Rate and Rhythm: Normal rate and regular rhythm.     Pulses: Normal pulses.     Heart sounds: Normal heart sounds. No murmur heard.    No gallop.  Pulmonary:     Effort: Pulmonary effort is normal. No respiratory distress.     Breath sounds: Normal breath sounds. No wheezing or rales.  Abdominal:     General: Bowel sounds are normal.     Tenderness: There is no abdominal tenderness.  Musculoskeletal:     Comments: Muscle strength 5/5 on upper and lower extremities.   Lymphadenopathy:     Cervical: No cervical adenopathy.  Skin:    General: Skin is warm and dry.  Neurological:     Mental Status: She is alert and oriented to person, place, and time.     Sensory: Sensation is intact.     Motor: Motor function is intact.     Coordination: Coordination is intact.     Deep Tendon Reflexes:  Reflex Scores:      Patellar reflexes are 2+ on the right side and 2+ on the left side. Psychiatric:        Mood and Affect: Mood normal.        Behavior: Behavior normal.        Judgment: Judgment normal.    BP 136/74 (BP Location: Right Arm, Patient Position: Sitting, Cuff Size: Normal)   Pulse 70   Temp 98 F (36.7 C) (Oral)   Resp 16   Ht 5\' 2"  (1.575 m)   Wt 152 lb 12.8 oz (69.3 kg)   SpO2 100%   BMI 27.95 kg/m  Wt Readings from Last 3 Encounters:  01/18/22 152 lb 12.8 oz (69.3 kg)  11/24/21 154 lb 12.8 oz (70.2 kg)  05/19/21 156 lb (70.8 kg)   Diabetic Foot Exam - Simple   No data filed    Lab  Results  Component Value Date   WBC 6.5 11/05/2020   HGB 12.5 11/05/2020   HCT 38.3 11/05/2020   PLT 229.0 11/05/2020   GLUCOSE 127 (H) 11/05/2020   CHOL 240 (H) 11/05/2020   TRIG 163.0 (H) 11/05/2020   HDL 60.30 11/05/2020   LDLCALC 147 (H) 11/05/2020   ALT 10 11/05/2020   AST 15 11/05/2020   NA 141 11/05/2020   K 3.9 11/05/2020   CL 105 11/05/2020   CREATININE 0.81 11/05/2020   BUN 13 11/05/2020   CO2 26 11/05/2020   TSH 1.04 11/05/2020   HGBA1C 6.6 (H) 11/05/2020   Lab Results  Component Value Date   TSH 1.04 11/05/2020   Lab Results  Component Value Date   WBC 6.5 11/05/2020   HGB 12.5 11/05/2020   HCT 38.3 11/05/2020   MCV 87.6 11/05/2020   PLT 229.0 11/05/2020   Lab Results  Component Value Date   NA 141 11/05/2020   K 3.9 11/05/2020   CO2 26 11/05/2020   GLUCOSE 127 (H) 11/05/2020   BUN 13 11/05/2020   CREATININE 0.81 11/05/2020   BILITOT 0.4 11/05/2020   ALKPHOS 92 11/05/2020   AST 15 11/05/2020   ALT 10 11/05/2020   PROT 7.0 11/05/2020   ALBUMIN 4.2 11/05/2020   CALCIUM 9.5 11/05/2020   GFR 65.95 11/05/2020   Lab Results  Component Value Date   CHOL 240 (H) 11/05/2020   Lab Results  Component Value Date   HDL 60.30 11/05/2020   Lab Results  Component Value Date   LDLCALC 147 (H) 11/05/2020   Lab Results  Component Value Date   TRIG 163.0 (H) 11/05/2020   Lab Results  Component Value Date   CHOLHDL 4 11/05/2020   Lab Results  Component Value Date   HGBA1C 6.6 (H) 11/05/2020   Mammogram: Last completed on 12/08/2021. No evidence of malignancy within either breast. Benign dystrophic calcification within the upper RIGHT breast corresponding to the palpable area of concern. Repeat in 1 year.      Assessment & Plan:   Problem List Items Addressed This Visit     Diabetes mellitus without complication (HCC)    hgba1c acceptable, minimize simple carbs. Increase exercise as tolerated. Continue current meds      Relevant Orders    Hemoglobin A1c   Microalbumin / creatinine urine ratio   Hyperlipidemia    Encourage heart healthy diet such as MIND or DASH diet, increase exercise, avoid trans fats, simple carbohydrates and processed foods, consider a krill or fish or flaxseed oil cap daily. Tolerating statin  Relevant Orders   Lipid panel   Primary hypertension    Well controlled, no changes to meds. Encouraged heart healthy diet such as the DASH diet and exercise as tolerated.       Relevant Orders   CBC   Comprehensive metabolic panel   TSH   Preventative health care    Patient encouraged to maintain heart healthy diet, regular exercise, adequate sleep. Consider daily probiotics. Take medications as prescribed. Labs ordered and reviewed.  Has aged out of screening colonoscopy, MGM and paps Consider Dexa scan Consider RSV (respiratory syncitial virus) vaccine at pharmacy, Arexvy Covid booster  new version At pharmacy High dose flu shot mid Sept and on  Shingrix is the new shingles shot, 2 shots over 2-6 months, confirm coverage with insurance and document, then can return here for shots with nurse appt or at pharmacy Given Prevnar 20 today      Other Visit Diagnoses     Need for pneumococcal 20-valent conjugate vaccination    -  Primary   Relevant Orders   Pneumococcal conjugate vaccine 20-valent (Prevnar 20) (Completed)      No orders of the defined types were placed in this encounter.  I, Danise Edge, MD, personally preformed the services described in this documentation.  All medical record entries made by the scribe were at my direction and in my presence.  I have reviewed the chart and discharge instructions (if applicable) and agree that the record reflects my personal performance and is accurate and complete. 01/18/2022  I,Mohammed Iqbal,acting as a scribe for Danise Edge, MD.,have documented all relevant documentation on the behalf of Danise Edge, MD,as directed by  Danise Edge, MD while in  the presence of Danise Edge, MD.  Danise Edge, MD

## 2022-01-18 NOTE — Patient Instructions (Addendum)
RSV (respiratory syncitial virus) vaccine at pharmacy, Arexvy Covid booste new version  At pharmacy High dose flu shot  Shingrix is the new shingles shot, 2 shots over 2-6 months, confirm coverage with insurance and document, then can return here for shots with nurse appt or at pharmacy   Prevnar 20 once Tetanus at the pharmacy  Preventive Care 65 Years and Older, Female Preventive care refers to lifestyle choices and visits with your health care provider that can promote health and wellness. Preventive care visits are also called wellness exams. What can I expect for my preventive care visit? Counseling Your health care provider may ask you questions about your: Medical history, including: Past medical problems. Family medical history. Pregnancy and menstrual history. History of falls. Current health, including: Memory and ability to understand (cognition). Emotional well-being. Home life and relationship well-being. Sexual activity and sexual health. Lifestyle, including: Alcohol, nicotine or tobacco, and drug use. Access to firearms. Diet, exercise, and sleep habits. Work and work Statistician. Sunscreen use. Safety issues such as seatbelt and bike helmet use. Physical exam Your health care provider will check your: Height and weight. These may be used to calculate your BMI (body mass index). BMI is a measurement that tells if you are at a healthy weight. Waist circumference. This measures the distance around your waistline. This measurement also tells if you are at a healthy weight and may help predict your risk of certain diseases, such as type 2 diabetes and high blood pressure. Heart rate and blood pressure. Body temperature. Skin for abnormal spots. What immunizations do I need?  Vaccines are usually given at various ages, according to a schedule. Your health care provider will recommend vaccines for you based on your age, medical history, and lifestyle or other  factors, such as travel or where you work. What tests do I need? Screening Your health care provider may recommend screening tests for certain conditions. This may include: Lipid and cholesterol levels. Hepatitis C test. Hepatitis B test. HIV (human immunodeficiency virus) test. STI (sexually transmitted infection) testing, if you are at risk. Lung cancer screening. Colorectal cancer screening. Diabetes screening. This is done by checking your blood sugar (glucose) after you have not eaten for a while (fasting). Mammogram. Talk with your health care provider about how often you should have regular mammograms. BRCA-related cancer screening. This may be done if you have a family history of breast, ovarian, tubal, or peritoneal cancers. Bone density scan. This is done to screen for osteoporosis. Talk with your health care provider about your test results, treatment options, and if necessary, the need for more tests. Follow these instructions at home: Eating and drinking  Eat a diet that includes fresh fruits and vegetables, whole grains, lean protein, and low-fat dairy products. Limit your intake of foods with high amounts of sugar, saturated fats, and salt. Take vitamin and mineral supplements as recommended by your health care provider. Do not drink alcohol if your health care provider tells you not to drink. If you drink alcohol: Limit how much you have to 0-1 drink a day. Know how much alcohol is in your drink. In the [place] one drink equals one 12 oz bottle of beer (355 mL), one 5 oz glass of wine (148 mL), or one 1 oz glass of hard liquor (44 mL). Lifestyle Brush your teeth every morning and night with fluoride toothpaste. Floss one time each day. Exercise for at least 30 minutes 5 or more days each week. Do not use any  products that contain nicotine or tobacco. These products include cigarettes, chewing tobacco, and vaping devices, such as e-cigarettes. If you need help quitting, ask  your health care provider. Do not use drugs. If you are sexually active, practice safe sex. Use a condom or other form of protection in order to prevent STIs. Take aspirin only as told by your health care provider. Make sure that you understand how much to take and what form to take. Work with your health care provider to find out whether it is safe and beneficial for you to take aspirin daily. Ask your health care provider if you need to take a cholesterol-lowering medicine (statin). Find healthy ways to manage stress, such as: Meditation, yoga, or listening to music. Journaling. Talking to a trusted person. Spending time with friends and family. Minimize exposure to UV radiation to reduce your risk of skin cancer. Safety Always wear your seat belt while driving or riding in a vehicle. Do not drive: If you have been drinking alcohol. Do not ride with someone who has been drinking. When you are tired or distracted. While texting. If you have been using any mind-altering substances or drugs. Wear a helmet and other protective equipment during sports activities. If you have firearms in your house, make sure you follow all gun safety procedures. What's next? Visit your health care provider once a year for an annual wellness visit. Ask your health care provider how often you should have your eyes and teeth checked. Stay up to date on all vaccines. This information is not intended to replace advice given to you by your health care provider. Make sure you discuss any questions you have with your health care provider. Document Revised: 09/30/2020 Document Reviewed: 09/30/2020 Elsevier Patient Education  [employer].

## 2022-01-19 ENCOUNTER — Telehealth: Payer: Self-pay | Admitting: Orthopedic Surgery

## 2022-01-19 ENCOUNTER — Other Ambulatory Visit: Payer: Self-pay

## 2022-01-19 ENCOUNTER — Other Ambulatory Visit: Payer: Self-pay | Admitting: *Deleted

## 2022-01-19 DIAGNOSIS — I10 Essential (primary) hypertension: Secondary | ICD-10-CM

## 2022-01-19 LAB — MICROALBUMIN / CREATININE URINE RATIO
Creatinine,U: 84.2 mg/dL
Microalb Creat Ratio: 3.6 mg/g (ref 0.0–30.0)
Microalb, Ur: 3 mg/dL — ABNORMAL HIGH (ref 0.0–1.9)

## 2022-01-19 MED ORDER — LISINOPRIL 5 MG PO TABS: 2.5000 mg | ORAL_TABLET | Freq: Every day | ORAL | 3 refills | Status: DC

## 2022-01-19 NOTE — Telephone Encounter (Signed)
Pt called requesting a call from [facility] refused to put in message what question is. Please call pt at [phone].

## 2022-01-19 NOTE — Telephone Encounter (Signed)
Tried calling, no answer. Unable to LM.

## 2022-01-20 ENCOUNTER — Telehealth: Payer: Self-pay | Admitting: Orthopedic Surgery

## 2022-01-20 NOTE — Telephone Encounter (Signed)
Pt states she is returning a call to [facility] phone number is [phone]

## 2022-01-21 NOTE — Telephone Encounter (Signed)
VOB submitted for SynviscOne, left knee. Next available gel injection would need to be after 04/05/2022

## 2022-01-23 ENCOUNTER — Encounter: Payer: Self-pay | Admitting: Family Medicine

## 2022-01-31 ENCOUNTER — Other Ambulatory Visit: Payer: Self-pay | Admitting: Family Medicine

## 2022-02-28 ENCOUNTER — Telehealth: Payer: Self-pay | Admitting: Family Medicine

## 2022-02-28 NOTE — Telephone Encounter (Signed)
Copied from CRM [id number]. Topic: Medicare AWV >> Feb 28, 2022 11:03 AM Gwenith Spitz wrote: Reason for CRM: LVM FOR PATIENT TO CALL [phone] TO SCHEDULE AWVS WITH [facility] HEALTH COACH

## 2022-03-07 ENCOUNTER — Telehealth: Payer: Self-pay | Admitting: Family Medicine

## 2022-03-07 NOTE — Telephone Encounter (Signed)
Got paperwork

## 2022-03-07 NOTE — Telephone Encounter (Signed)
Pt's family member dropped off copy of Pts immunization - [facility]. Document put at front office tray for provider to have on pt's chart.

## 2022-03-09 ENCOUNTER — Ambulatory Visit: Payer: Medicare HMO | Admitting: Podiatry

## 2022-03-19 ENCOUNTER — Other Ambulatory Visit: Payer: Self-pay | Admitting: Podiatry

## 2022-03-20 ENCOUNTER — Encounter: Payer: Self-pay | Admitting: Family Medicine

## 2022-03-21 ENCOUNTER — Other Ambulatory Visit: Payer: Self-pay | Admitting: Family Medicine

## 2022-03-21 DIAGNOSIS — R059 Cough, unspecified: Secondary | ICD-10-CM

## 2022-03-22 ENCOUNTER — Telehealth: Payer: Self-pay

## 2022-03-22 NOTE — Telephone Encounter (Signed)
Faxed completed PA form to [employer] at [phone] for SynviscOne, left knee. PA pending Appt. Needs to be after 04/05/22 for gel injection

## 2022-03-24 ENCOUNTER — Other Ambulatory Visit: Payer: Self-pay

## 2022-03-24 DIAGNOSIS — M1712 Unilateral primary osteoarthritis, left knee: Secondary | ICD-10-CM

## 2022-03-24 NOTE — Telephone Encounter (Signed)
Noted, appointment has been R/S

## 2022-03-24 NOTE — Telephone Encounter (Signed)
Pt called in stating that she will take the appt on 04/14/2022 @ 9:15am..Marland Kitchen

## 2022-03-28 ENCOUNTER — Other Ambulatory Visit: Payer: Self-pay | Admitting: Family Medicine

## 2022-03-30 ENCOUNTER — Ambulatory Visit (INDEPENDENT_AMBULATORY_CARE_PROVIDER_SITE_OTHER): Payer: Medicare HMO

## 2022-03-30 VITALS — BP 114/63 | HR 67 | Wt 149.0 lb

## 2022-03-30 DIAGNOSIS — Z0000 Encounter for general adult medical examination without abnormal findings: Secondary | ICD-10-CM | POA: Diagnosis not present

## 2022-03-30 NOTE — Progress Notes (Addendum)
I connected with  Abigail Mcpherson on 03/30/22 by a audio enabled telemedicine application and verified that I am speaking with the correct person using two identifiers. With her daughter Abigail Mcpherson  Patient Location: Home  Provider Location: Home Office  I discussed the limitations of evaluation and management by telemedicine. The patient expressed understanding and agreed to proceed.   Subjective:   Abigail Mcpherson is a 88 y.o. female who presents for Medicare Annual (Subsequent) preventive examination.  Review of Systems     Cardiac Risk Factors include: advanced age (>72men, >58 women);dyslipidemia;hypertension;diabetes mellitus     Objective:    Today's Vitals   03/30/22 1252  BP: 114/63  Pulse: 67  Weight: 149 lb (67.6 kg)   Body mass index is 27.25 kg/m.     03/30/2022    1:14 PM 01/06/2021   11:12 AM  Advanced Directives  Does Patient Have a Medical Advance Directive? Yes No  Type of Estate agent of [place];Living will   Copy of Healthcare Power of Attorney in Chart? No - copy requested   Would patient like information on creating a medical advance directive?  Yes (MAU/Ambulatory/Procedural Areas - Information given)    Current Medications (verified) Outpatient Encounter Medications as of 03/30/2022  Medication Sig   albuterol (VENTOLIN HFA) 108 (90 Base) MCG/ACT inhaler USE 2 INHALATIONS BY MOUTH EVERY 6 HOURS AS NEEDED FOR WHEEZING  OR SHORTNESS OF BREATH   amLODipine (NORVASC) 2.5 MG tablet TAKE 1 TABLET BY MOUTH DAILY   Ascorbic Acid (VITAMIN C PO) Take by mouth.   cetirizine (ZYRTEC) 10 MG tablet Take 1 tablet (10 mg total) by mouth daily.   famotidine (PEPCID) 40 MG tablet Take 1 tablet (40 mg total) by mouth at bedtime.   furosemide (LASIX) 20 MG tablet TAKE 1 TABLET BY MOUTH DAILY AND A SECOND TABLET DAILY AS NEEDED  FOR PEDAL EDEMA OR WEIGHT GAIN  OF 3 LB IN 24 HOURS   gabapentin (NEURONTIN) 100 MG capsule TAKE 1 CAPSULE(100 MG)  BY MOUTH THREE TIMES DAILY   hydrochlorothiazide (MICROZIDE) 12.5 MG capsule TAKE 1 CAPSULE BY MOUTH DAILY   metFORMIN (GLUCOPHAGE-XR) 500 MG 24 hr tablet TAKE 2 TABLETS BY MOUTH IN  THE MORNING AND 1 TABLET BY MOUTH IN THE EVENING   montelukast (SINGULAIR) 10 MG tablet TAKE 1 TABLET BY MOUTH AT  BEDTIME AS NEEDED FOR ALLERGIES   omeprazole (PRILOSEC) 40 MG capsule TAKE 1 CAPSULE BY MOUTH IN THE  MORNING AND AT BEDTIME   oxybutynin (DITROPAN-XL) 10 MG 24 hr tablet TAKE 1 TABLET(10 MG) BY MOUTH AT BEDTIME   potassium chloride SA (KLOR-CON M) 20 MEQ tablet TAKE 1 TABLET BY MOUTH DAILY   pregabalin (LYRICA) 50 MG capsule TAKE 1 CAPSULE(50 MG) BY MOUTH THREE TIMES DAILY   rosuvastatin (CRESTOR) 10 MG tablet TAKE 1 TABLET BY MOUTH DAILY   VITAMIN A PO Take by mouth.   VITAMIN B COMPLEX-C PO Take by mouth.   VITAMIN D PO Take by mouth.   benzonatate (TESSALON) 100 MG capsule TAKE 1 CAPSULE(100 MG) BY MOUTH THREE TIMES DAILY AS NEEDED FOR COUGH (Patient not taking: Reported on 03/30/2022)   tiZANidine (ZANAFLEX) 2 MG tablet Take 0.5-1 tablets (1-2 mg total) by mouth at bedtime as needed for muscle spasms. (Patient not taking: Reported on 03/30/2022)   [DISCONTINUED] valACYclovir (VALTREX) 1000 MG tablet Take 1 tablet (1,000 mg total) by mouth 3 (three) times daily.   No facility-administered encounter medications on file as of  03/30/2022.    Allergies (verified) Iodine, Shellfish allergy, Codeine, and Demerol [meperidine hcl]   History: Past Medical History:  Diagnosis Date   Allergy    Cataract    Diabetes mellitus without complication (HCC)    GERD (gastroesophageal reflux disease)    History of shingles    Hyperlipidemia    Hypertension    Sleep apnea    cannot tolerate CPAP   Past Surgical History:  Procedure Laterality Date   APPENDECTOMY     BREAST BIOPSY Bilateral    benign   BREAST EXCISIONAL BIOPSY Bilateral    benign   BREAST SURGERY     breast biopsy left , b/l  excisional biopsy 1969   BUNIONECTOMY Bilateral    CESAREAN SECTION     Family History  Problem Relation Age of Onset   Diabetes Mother    Hypertension Mother    Hernia Mother        inguinal   Diabetes Father    Stroke Son    Aneurysm Paternal Aunt        brain   Cancer Sister        intestinal   Hypertension Sister    Diabetes Sister    Glaucoma Sister    Diabetes Sister    Hypertension Sister    Hyperlipidemia Sister    Diabetes Sister    Hypertension Sister    Hyperlipidemia Sister    Hernia Sister        umbilical   Hyperlipidemia Sister    Hypertension Sister    Diabetes Sister    Alcohol abuse Sister    Diabetes Sister    Hyperlipidemia Sister    Hypertension Sister    Obesity Sister    Stomach cancer Neg Hx    Colon cancer Neg Hx    Esophageal cancer Neg Hx    Colon polyps Neg Hx    Social History   Socioeconomic History   Marital status: Divorced    Spouse name: Not on file   Number of children: Not on file   Years of education: Not on file   Highest education level: Not on file  Occupational History   Occupation: Nurse    Comment: retired  Tobacco Use   Smoking status: Former    Types: Cigarettes   Smokeless tobacco: Never  Vaping Use   Vaping Use: Never used  Substance and Sexual Activity   Alcohol use: Yes    Comment: occ   Drug use: Never   Sexual activity: Not Currently  Other Topics Concern   Not on file  Social History Narrative   Avoids pork and shellfish and deep sea fish. Lives with family, daughter, wears seat belt always. Divorced, retired Engineer, civil (consulting)   Social Determinants of Corporate investment banker Strain: Low Risk  (03/30/2022)   Overall Financial Resource Strain (CARDIA)    Difficulty of Paying Living Expenses: Not hard at all  Food Insecurity: No Food Insecurity (03/30/2022)   Hunger Vital Sign    Worried About Running Out of Food in the Last Year: Never true    Ran Out of Food in the Last Year: Never true   Transportation Needs: No Transportation Needs (03/30/2022)   PRAPARE - Administrator, Civil Service (Medical): No    Lack of Transportation (Non-Medical): No  Physical Activity: Insufficiently Active (03/30/2022)   Exercise Vital Sign    Days of Exercise per Week: 7 days    Minutes of Exercise per Session:  20 min  Stress: No Stress Concern Present (03/30/2022)   [employer] of Occupational Health - Occupational Stress Questionnaire    Feeling of Stress : Not at all  Social Connections: Socially Isolated (03/30/2022)   Social Connection and Isolation Panel [NHANES]    Frequency of Communication with Friends and Family: Three times a week    Frequency of Social Gatherings with Friends and Family: Once a week    Attends Religious Services: Never    Database administrator or Organizations: No    Attends Engineer, structural: Never    Marital Status: Divorced    Tobacco Counseling Counseling given: Not Answered   Clinical Intake:  Pre-visit preparation completed: Yes  Pain : No/denies pain     BMI - recorded: 27.44 Nutritional Status: BMI 25 -29 Overweight Nutritional Risks: None Diabetes: Yes CBG done?: Yes (123 per pt) CBG resulted in Enter/ Edit results?: No Did pt. bring in CBG monitor from home?: No  How often do you need to have someone help you when you read instructions, pamphlets, or other written materials from your doctor or pharmacy?: 1 - Never  Diabetic?Nutrition Risk Assessment:  Has the patient had any N/V/D within the last 2 months?  No  Does the patient have any non-healing wounds?  No  Has the patient had any unintentional weight loss or weight gain?  No   Diabetes:  Is the patient diabetic?  Yes  If diabetic, was a CBG obtained today?  Yes  Did the patient bring in their glucometer from home?  No  How often do you monitor your CBG's? Daily .   Financial Strains and Diabetes Management:  Are you having any financial  strains with the device, your supplies or your medication? No .  Does the patient want to be seen by Chronic Care Management for management of their diabetes?  No  Would the patient like to be referred to a Nutritionist or for Diabetic Management?  No   Diabetic Exams:  Diabetic Eye Exam: Completed 12/13/21 Diabetic Foot Exam: Completed 12/15/21   Interpreter Needed?: No  Information entered by :: Lanier Ensign, LPN   Activities of Daily Living    03/30/2022    1:16 PM  In your present state of health, do you have any difficulty performing the following activities:  Hearing? 0  Vision? 0  Difficulty concentrating or making decisions? 0  Walking or climbing stairs? 1  Comment avoid stairs  Dressing or bathing? 0  Doing errands, shopping? 0  Preparing Food and eating ? N  Using the Toilet? N  In the past six months, have you accidently leaked urine? N  Do you have problems with loss of bowel control? N  Managing your Medications? N  Managing your Finances? N  Housekeeping or managing your Housekeeping? N    Patient Care Team: [name], MD as PCP - General (Family Medicine)  Indicate any recent Medical Services you may have received from other than [facility] providers in the past year (date may be approximate).     Assessment:   This is a routine wellness examination for Abigail Mcpherson.  Hearing/Vision screen Hearing Screening - Comments:: Pt denies any hearing issues  Vision Screening - Comments:: Pt follows up with Dr Joseph Art for annul eye exams   Dietary issues and exercise activities discussed: Current Exercise Habits: Home exercise routine, Type of exercise: walking, Time (Minutes): 20, Frequency (Times/Week): 7, Weekly Exercise (Minutes/Week): 140   Goals Addressed  This Visit's Progress    Patient Stated       Patient Stated       Lose a little more weight        Depression Screen    03/30/2022    1:11 PM 01/18/2022   10:37 AM 01/06/2021    11:16 AM 01/31/2020   10:31 AM 05/29/2018   11:50 AM  PHQ 2/9 Scores  PHQ - 2 Score 0 0 0 1 0  PHQ- 9 Score  0       Fall Risk    03/30/2022    1:16 PM 01/06/2021   11:15 AM 05/29/2018   11:50 AM  Fall Risk   Falls in the past year? 0 0 0  Number falls in past yr: 0 0   Injury with Fall? 0 0   Risk for fall due to : Impaired balance/gait    Follow up Falls prevention discussed Falls prevention discussed     FALL RISK PREVENTION PERTAINING TO THE HOME:  Any stairs in or around the home? Yes  If so, are there any without handrails? No  Home free of loose throw rugs in walkways, pet beds, electrical cords, etc? Yes  Adequate lighting in your home to reduce risk of falls? Yes   ASSISTIVE DEVICES UTILIZED TO PREVENT FALLS:  Life alert? No  Use of a cane, walker or w/c? Yes  Grab bars in the bathroom? Yes  Shower chair or bench in shower? Yes  Elevated toilet seat or a handicapped toilet? Yes   TIMED UP AND GO:  Was the test performed? No .   Cognitive Function:        03/30/2022    1:17 PM  6CIT Screen  What Year? 0 points  What month? 0 points  What time? 0 points  Count back from 20 0 points  Months in reverse 2 points  Repeat phrase 0 points  Total Score 2 points    Immunizations Immunization History  Administered Date(s) Administered   Moderna Covid-19 Vaccine Bivalent Booster 761yrs & up 03/05/2022   Moderna Sars-Covid-2 Vaccination 06/16/2019, 07/14/2019   PFIZER(Purple Top)SARS-COV-2 Vaccination 05/14/2020, 09/25/2020   PNEUMOCOCCAL CONJUGATE-20 01/18/2022    TDAP status: Up to date  Flu Vaccine status: Declined, Education has been provided regarding the importance of this vaccine but patient still declined. Advised may receive this vaccine at local pharmacy or Health Dept. Aware to provide a copy of the vaccination record if obtained from local pharmacy or Health Dept. Verbalized acceptance and understanding.  Pneumococcal vaccine status: Up to  date  Covid-19 vaccine status: Completed vaccines  Qualifies for Shingles Vaccine? Yes   Zostavax completed No   Shingrix Completed?: No.    Education has been provided regarding the importance of this vaccine. Patient has been advised to call insurance company to determine out of pocket expense if they have not yet received this vaccine. Advised may also receive vaccine at local pharmacy or Health Dept. Verbalized acceptance and understanding.  Screening Tests Health Maintenance  Topic Date Due   DTaP/Tdap/Td (1 - Tdap) Never done   Zoster Vaccines- Shingrix (1 of 2) Never done   DEXA SCAN  Never done   INFLUENZA VACCINE  Never done   COVID-19 Vaccine (6 - 2023-24 season) 04/30/2022   HEMOGLOBIN A1C  07/20/2022   OPHTHALMOLOGY EXAM  12/14/2022   FOOT EXAM  12/16/2022   Medicare Annual Wellness (AWV)  03/31/2023   Pneumonia Vaccine 2765+ Years old  Completed  HPV VACCINES  Aged Out    Health Maintenance  Health Maintenance Due  Topic Date Due   DTaP/Tdap/Td (1 - Tdap) Never done   Zoster Vaccines- Shingrix (1 of 2) Never done   DEXA SCAN  Never done   INFLUENZA VACCINE  Never done    Colorectal cancer screening: No longer required.   Mammogram status: Completed 12/08/21. Repeat every year   Additional Screening:    Vision Screening: Recommended annual ophthalmology exams for early detection of glaucoma and other disorders of the eye. Is the patient up to date with their annual eye exam?  Yes  Who is the provider or what is the name of the office in which the patient attends annual eye exams? Dr Joseph Art  If pt is not established with a provider, would they like to be referred to a provider to establish care? No .   Dental Screening: Recommended annual dental exams for proper oral hygiene  Community Resource Referral / Chronic Care Management: CRR required this visit?  No   CCM required this visit?  No      Plan:     I have personally reviewed and noted the  following in the patient's chart:   Medical and social history Use of alcohol, tobacco or illicit drugs  Current medications and supplements including opioid prescriptions. Patient is not currently taking opioid prescriptions. Functional ability and status Nutritional status Physical activity Advanced directives List of other physicians Hospitalizations, surgeries, and ER visits in previous 12 months Vitals Screenings to include cognitive, depression, and falls Referrals and appointments  In addition, I have reviewed and discussed with patient certain preventive protocols, quality metrics, and best practice recommendations. A written personalized care plan for preventive services as well as general preventive health recommendations were provided to patient.     Marzella Schlein, LPN   21/22/4825   Nurse Notes: none  I have reviewed and agree with [facility] documentation.  Willow Ora, MD

## 2022-03-30 NOTE — Patient Instructions (Signed)
Abigail Mcpherson , Thank you for taking time to come for your Medicare Wellness Visit. I appreciate your ongoing commitment to your health goals. Please review the following plan we discussed and let me know if I can assist you in the future.   These are the goals we discussed:  Goals      Patient Stated     Maintain current health & independence     Patient Stated     Patient Stated     Lose a little more weight         This is a list of the screening recommended for you and due dates:  Health Maintenance  Topic Date Due   DTaP/Tdap/Td vaccine (1 - Tdap) Never done   Zoster (Shingles) Vaccine (1 of 2) Never done   DEXA scan (bone density measurement)  Never done   Flu Shot  Never done   COVID-19 Vaccine (6 - 2023-24 season) 04/30/2022   Hemoglobin A1C  07/20/2022   Eye exam for diabetics  12/14/2022   Complete foot exam   12/16/2022   Medicare Annual Wellness Visit  03/31/2023   Pneumonia Vaccine  Completed   HPV Vaccine  Aged Out    Advanced directives: Please bring a copy of your health care power of attorney and living will to the office at your convenience.  Conditions/risks identified: lose a little weight   Next appointment: Follow up in one year for your annual wellness visit    Preventive Care 65 Years and Older, Female Preventive care refers to lifestyle choices and visits with your health care provider that can promote health and wellness. What does preventive care include? A yearly physical exam. This is also called an annual well check. Dental exams once or twice a year. Routine eye exams. Ask your health care provider how often you should have your eyes checked. Personal lifestyle choices, including: Daily care of your teeth and gums. Regular physical activity. Eating a healthy diet. Avoiding tobacco and drug use. Limiting alcohol use. Practicing safe sex. Taking low-dose aspirin every day. Taking vitamin and mineral supplements as recommended by your  health care provider. What happens during an annual well check? The services and screenings done by your health care provider during your annual well check will depend on your age, overall health, lifestyle risk factors, and family history of disease. Counseling  Your health care provider may ask you questions about your: Alcohol use. Tobacco use. Drug use. Emotional well-being. Home and relationship well-being. Sexual activity. Eating habits. History of falls. Memory and ability to understand (cognition). Work and work Astronomer. Reproductive health. Screening  You may have the following tests or measurements: Height, weight, and BMI. Blood pressure. Lipid and cholesterol levels. These may be checked every 5 years, or more frequently if you are over 74 years old. Skin check. Lung cancer screening. You may have this screening every year starting at age 74 if you have a 30-pack-year history of smoking and currently smoke or have quit within the past 15 years. Fecal occult blood test (FOBT) of the stool. You may have this test every year starting at age 69. Flexible sigmoidoscopy or colonoscopy. You may have a sigmoidoscopy every 5 years or a colonoscopy every 10 years starting at age 64. Hepatitis C blood test. Hepatitis B blood test. Sexually transmitted disease (STD) testing. Diabetes screening. This is done by checking your blood sugar (glucose) after you have not eaten for a while (fasting). You may have this done every  1-3 years. Bone density scan. This is done to screen for osteoporosis. You may have this done starting at age 8. Mammogram. This may be done every 1-2 years. Talk to your health care provider about how often you should have regular mammograms. Talk with your health care provider about your test results, treatment options, and if necessary, the need for more tests. Vaccines  Your health care provider may recommend certain vaccines, such as: Influenza vaccine.  This is recommended every year. Tetanus, diphtheria, and acellular pertussis (Tdap, Td) vaccine. You may need a Td booster every 10 years. Zoster vaccine. You may need this after age 64. Pneumococcal 13-valent conjugate (PCV13) vaccine. One dose is recommended after age 84. Pneumococcal polysaccharide (PPSV23) vaccine. One dose is recommended after age 42. Talk to your health care provider about which screenings and vaccines you need and how often you need them. This information is not intended to replace advice given to you by your health care provider. Make sure you discuss any questions you have with your health care provider. Document Released: 05/01/2015 Document Revised: 12/23/2015 Document Reviewed: 02/03/2015 Elsevier Interactive Patient Education  2017 [place] Prevention in the Home Falls can cause injuries. They can happen to people of all ages. There are many things you can do to make your home safe and to help prevent falls. What can I do on the outside of my home? Regularly fix the edges of walkways and driveways and fix any cracks. Remove anything that might make you trip as you walk through a door, such as a raised step or threshold. Trim any bushes or trees on the path to your home. Use bright outdoor lighting. Clear any walking paths of anything that might make someone trip, such as rocks or tools. Regularly check to see if handrails are loose or broken. Make sure that both sides of any steps have handrails. Any raised decks and porches should have guardrails on the edges. Have any leaves, snow, or ice cleared regularly. Use sand or salt on walking paths during winter. Clean up any spills in your garage right away. This includes oil or grease spills. What can I do in the bathroom? Use night lights. Install grab bars by the toilet and in the tub and shower. Do not use towel bars as grab bars. Use non-skid mats or decals in the tub or shower. If you need to sit  down in the shower, use a plastic, non-slip stool. Keep the floor dry. Clean up any water that spills on the floor as soon as it happens. Remove soap buildup in the tub or shower regularly. Attach bath mats securely with double-sided non-slip rug tape. Do not have throw rugs and other things on the floor that can make you trip. What can I do in the bedroom? Use night lights. Make sure that you have a light by your bed that is easy to reach. Do not use any sheets or blankets that are too big for your bed. They should not hang down onto the floor. Have a firm chair that has side arms. You can use this for support while you get dressed. Do not have throw rugs and other things on the floor that can make you trip. What can I do in the kitchen? Clean up any spills right away. Avoid walking on wet floors. Keep items that you use a lot in easy-to-reach places. If you need to reach something above you, use a strong step stool that has a  grab bar. Keep electrical cords out of the way. Do not use floor polish or wax that makes floors slippery. If you must use wax, use non-skid floor wax. Do not have throw rugs and other things on the floor that can make you trip. What can I do with my stairs? Do not leave any items on the stairs. Make sure that there are handrails on both sides of the stairs and use them. Fix handrails that are broken or loose. Make sure that handrails are as long as the stairways. Check any carpeting to make sure that it is firmly attached to the stairs. Fix any carpet that is loose or worn. Avoid having throw rugs at the top or bottom of the stairs. If you do have throw rugs, attach them to the floor with carpet tape. Make sure that you have a light switch at the top of the stairs and the bottom of the stairs. If you do not have them, ask someone to add them for you. What else can I do to help prevent falls? Wear shoes that: Do not have high heels. Have rubber bottoms. Are  comfortable and fit you well. Are closed at the toe. Do not wear sandals. If you use a stepladder: Make sure that it is fully opened. Do not climb a closed stepladder. Make sure that both sides of the stepladder are locked into place. Ask someone to hold it for you, if possible. Clearly mark and make sure that you can see: Any grab bars or handrails. First and last steps. Where the edge of each step is. Use tools that help you move around (mobility aids) if they are needed. These include: Canes. Walkers. Scooters. Crutches. Turn on the lights when you go into a dark area. Replace any light bulbs as soon as they burn out. Set up your furniture so you have a clear path. Avoid moving your furniture around. If any of your floors are uneven, fix them. If there are any pets around you, be aware of where they are. Review your medicines with your doctor. Some medicines can make you feel dizzy. This can increase your chance of falling. Ask your doctor what other things that you can do to help prevent falls. This information is not intended to replace advice given to you by your health care provider. Make sure you discuss any questions you have with your health care provider. Document Released: 01/29/2009 Document Revised: 09/10/2015 Document Reviewed: 05/09/2014 Elsevier Interactive Patient Education  2017 [employer].

## 2022-04-06 ENCOUNTER — Ambulatory Visit: Payer: Medicare HMO | Admitting: Surgical

## 2022-04-14 ENCOUNTER — Telehealth: Payer: Self-pay | Admitting: Orthopedic Surgery

## 2022-04-14 ENCOUNTER — Ambulatory Visit: Payer: Medicare HMO | Admitting: Surgical

## 2022-04-14 ENCOUNTER — Telehealth: Payer: Self-pay

## 2022-04-14 ENCOUNTER — Encounter: Payer: Self-pay | Admitting: Surgical

## 2022-04-14 DIAGNOSIS — M170 Bilateral primary osteoarthritis of knee: Secondary | ICD-10-CM | POA: Diagnosis not present

## 2022-04-14 DIAGNOSIS — M1712 Unilateral primary osteoarthritis, left knee: Secondary | ICD-10-CM | POA: Diagnosis not present

## 2022-04-14 DIAGNOSIS — M1711 Unilateral primary osteoarthritis, right knee: Secondary | ICD-10-CM | POA: Diagnosis not present

## 2022-04-14 MED ORDER — HYLAN G-F 20 48 MG/6ML IX SOSY
48.0000 mg | PREFILLED_SYRINGE | INTRA_ARTICULAR | Status: AC | PRN
  Administered 2022-04-14: 48 mg via INTRA_ARTICULAR

## 2022-04-14 MED ORDER — LIDOCAINE HCL 1 % IJ SOLN
5.0000 mL | INTRAMUSCULAR | Status: AC | PRN
  Administered 2022-04-14: 5 mL

## 2022-04-14 NOTE — Telephone Encounter (Signed)
Auth needed for gel injection in 6 months from today

## 2022-04-14 NOTE — Telephone Encounter (Signed)
Placard put at front desk for pick up. Patient aware.

## 2022-04-14 NOTE — Telephone Encounter (Signed)
Patient needs new Handicap Placard

## 2022-04-14 NOTE — Progress Notes (Signed)
   Procedure Note  Patient: Raeli Wiens             Date of Birth: [birth year]           MRN: [id number]             Visit Date: 04/14/2022  Procedures: Visit Diagnoses:  1. Unilateral primary osteoarthritis, left knee   2. Unilateral primary osteoarthritis, right knee     Large Joint Inj: bilateral knee on 04/14/2022 9:38 AM Indications: diagnostic evaluation, joint swelling and pain Details: 18 G 1.5 in needle, superolateral approach  Arthrogram: No  Medications (Right): 5 mL lidocaine 1 %; 48 mg Hylan G-F 20 48 MG/6ML Medications (Left): 5 mL lidocaine 1 %; 48 mg Hylan G-F 20 48 MG/6ML Outcome: tolerated well, no immediate complications Procedure, treatment alternatives, risks and benefits explained, specific risks discussed. Consent was given by the patient. Immediately prior to procedure a time out was called to verify the correct patient, procedure, equipment, support staff and site/side marked as required. Patient was prepped and draped in the usual sterile fashion.

## 2022-04-21 NOTE — Telephone Encounter (Signed)
Will submit in June, 2024. Next available gel injection would need to be after 10/14/2022.

## 2022-04-25 ENCOUNTER — Encounter: Payer: Self-pay | Admitting: Family Medicine

## 2022-04-26 ENCOUNTER — Other Ambulatory Visit: Payer: Self-pay | Admitting: Family Medicine

## 2022-04-26 MED ORDER — CETIRIZINE HCL 10 MG PO TABS: 10.0000 mg | ORAL_TABLET | Freq: Every day | ORAL | 3 refills | Status: AC

## 2022-07-20 ENCOUNTER — Other Ambulatory Visit: Payer: Self-pay | Admitting: Family Medicine

## 2022-07-20 NOTE — Assessment & Plan Note (Signed)
Encourage heart healthy diet such as MIND or DASH diet, increase exercise, avoid trans fats, simple carbohydrates and processed foods, consider a krill or fish or flaxseed oil cap daily. Tolerating statin 

## 2022-07-20 NOTE — Assessment & Plan Note (Signed)
hgba1c acceptable, minimize simple carbs. Increase exercise as tolerated. Continue current meds 

## 2022-07-20 NOTE — Assessment & Plan Note (Signed)
Well controlled, no changes to meds. Encouraged heart healthy diet such as the DASH diet and exercise as tolerated.  °

## 2022-07-21 ENCOUNTER — Encounter: Payer: Self-pay | Admitting: Family Medicine

## 2022-07-21 ENCOUNTER — Ambulatory Visit: Payer: Medicare HMO | Admitting: Family Medicine

## 2022-07-21 VITALS — BP 124/78 | HR 65 | Temp 97.8°F | Resp 16 | Ht <= 58 in | Wt 158.8 lb

## 2022-07-21 DIAGNOSIS — M25562 Pain in left knee: Secondary | ICD-10-CM

## 2022-07-21 DIAGNOSIS — M25551 Pain in right hip: Secondary | ICD-10-CM | POA: Insufficient documentation

## 2022-07-21 DIAGNOSIS — I10 Essential (primary) hypertension: Secondary | ICD-10-CM | POA: Diagnosis not present

## 2022-07-21 DIAGNOSIS — E785 Hyperlipidemia, unspecified: Secondary | ICD-10-CM | POA: Diagnosis not present

## 2022-07-21 DIAGNOSIS — E2839 Other primary ovarian failure: Secondary | ICD-10-CM | POA: Diagnosis not present

## 2022-07-21 DIAGNOSIS — G8929 Other chronic pain: Secondary | ICD-10-CM

## 2022-07-21 DIAGNOSIS — M25552 Pain in left hip: Secondary | ICD-10-CM

## 2022-07-21 DIAGNOSIS — Z780 Asymptomatic menopausal state: Secondary | ICD-10-CM

## 2022-07-21 DIAGNOSIS — E119 Type 2 diabetes mellitus without complications: Secondary | ICD-10-CM

## 2022-07-21 LAB — COMPREHENSIVE METABOLIC PANEL
ALT: 12 U/L (ref 0–35)
AST: 16 U/L (ref 0–37)
Albumin: 4.5 g/dL (ref 3.5–5.2)
Alkaline Phosphatase: 99 U/L (ref 39–117)
BUN: 18 mg/dL (ref 6–23)
CO2: 30 mEq/L (ref 19–32)
Calcium: 9.9 mg/dL (ref 8.4–10.5)
Chloride: 102 mEq/L (ref 96–112)
Creatinine, Ser: 0.79 mg/dL (ref 0.40–1.20)
GFR: 67.15 mL/min (ref >60.00)
Glucose, Bld: 127 mg/dL — ABNORMAL HIGH (ref 70–99)
Potassium: 4.2 mEq/L (ref 3.5–5.1)
Sodium: 140 mEq/L (ref 135–145)
Total Bilirubin: 0.3 mg/dL (ref 0.2–1.2)
Total Protein: 7 g/dL (ref 6.0–8.3)

## 2022-07-21 LAB — HEMOGLOBIN A1C: Hgb A1c MFr Bld: 7.2 % — ABNORMAL HIGH (ref 4.6–6.5)

## 2022-07-21 LAB — CBC WITH DIFFERENTIAL/PLATELET
Basophils Absolute: 0 10*3/uL (ref 0.0–0.1)
Basophils Relative: 0.3 % (ref 0.0–3.0)
Eosinophils Absolute: 0.1 10*3/uL (ref 0.0–0.7)
Eosinophils Relative: 0.9 % (ref 0.0–5.0)
HCT: 39.2 % (ref 36.0–46.0)
Hemoglobin: 12.8 g/dL (ref 12.0–15.0)
Lymphocytes Relative: 38 % (ref 12.0–46.0)
Lymphs Abs: 2.5 10*3/uL (ref 0.7–4.0)
MCHC: 32.8 g/dL (ref 30.0–36.0)
MCV: 85.4 fl (ref 78.0–100.0)
Monocytes Absolute: 0.4 10*3/uL (ref 0.1–1.0)
Monocytes Relative: 6.4 % (ref 3.0–12.0)
Neutro Abs: 3.6 10*3/uL (ref 1.4–7.7)
Neutrophils Relative %: 54.4 % (ref 43.0–77.0)
Platelets: 222 10*3/uL (ref 150.0–400.0)
RBC: 4.59 Mil/uL (ref 3.87–5.11)
RDW: 14.4 % (ref 11.5–15.5)
WBC: 6.7 10*3/uL (ref 4.0–10.5)

## 2022-07-21 LAB — TSH: TSH: 1.16 u[IU]/mL (ref 0.35–5.50)

## 2022-07-21 LAB — LIPID PANEL
Cholesterol: 159 mg/dL (ref 0–200)
HDL: 75 mg/dL (ref >39.00)
LDL Cholesterol: 68 mg/dL (ref 0–99)
NonHDL: 83.77
Total CHOL/HDL Ratio: 2
Triglycerides: 79 mg/dL (ref 0.0–149.0)
VLDL: 15.8 mg/dL (ref 0.0–40.0)

## 2022-07-21 LAB — URINALYSIS, MICROSCOPIC ONLY: RBC / HPF: NONE SEEN (ref 0–?)

## 2022-07-21 NOTE — Patient Instructions (Addendum)
Tetanus  Shingrix is the new shingles shot, 2 shots over 2-6 months, confirm coverage with insurance and document, then can return here for shots with nurse appt or at pharmacy  RSV, Respiratory Syncitial Virus vaccine Arexvy at the vaccine Dexa   Hip Pain The hip is the joint between the upper legs and the lower pelvis. The bones, cartilage, tendons, and muscles of your hip joint support your body and allow you to move around. Hip pain can range from a minor ache to severe pain in one or both of your hips. The pain may be felt on the inside of the hip joint near the groin, or on the outside near the buttocks and upper thigh. You may also have swelling or stiffness in your hip area. Follow these instructions at home: Managing pain, stiffness, and swelling     If told, put ice on the painful area. Put ice in a plastic bag. Place a towel between your skin and the bag. Leave the ice on for 20 minutes, 2-3 times a day. If told, apply heat to the affected area as often as told by your health care provider. Use the heat source that your provider recommends, such as a moist heat pack or a heating pad. Place a towel between your skin and the heat source. Leave the heat on for 20-30 minutes. If your skin turns bright red, remove the ice or heat right away to prevent skin damage. The risk of damage is higher if you cannot feel pain, heat, or cold. Activity Do exercises as told by your provider. Avoid activities that cause pain. General instructions  Take over-the-counter and prescription medicines only as told by your provider. Keep a journal of your symptoms. Write down: How often you have hip pain. The location of your pain. What the pain feels like. What makes the pain worse. Sleep with a pillow between your legs on your most comfortable side. Keep all follow-up visits. Your provider will monitor your pain and activity. Contact a health care provider if: You cannot put weight on your  leg. Your pain or swelling gets worse after a week. It gets harder to walk. You have a fever. Get help right away if: You fall. You have a sudden increase in pain and swelling in your hip. Your hip is red or swollen or very tender to touch. This information is not intended to replace advice given to you by your health care provider. Make sure you discuss any questions you have with your health care provider. Document Revised: 12/07/2021 Document Reviewed: 12/07/2021 Elsevier Patient Education  [place].

## 2022-07-21 NOTE — Progress Notes (Signed)
Subjective:    Patient ID: Abigail Mcpherson, female    DOB: [birth year], 89 y.o.   MRN: [id number]  Chief Complaint  Patient presents with   Follow-up    Follow up    HPI Patient is in today for follow up on chronic medical concerns. No recent febrile illness or hospitalizations. Denies CP/palp/SOB/HA/congestion/fevers/GI or GU c/o. Taking meds as prescribed. She is accompanied by her daughter with whom she lives and over all she is doing well. She has continued to have some trouble with her left knee but not worsening. She has also developed trouble with both hips worse with walking and better when lying down.  Past Medical History:  Diagnosis Date   Allergy    Cataract    Diabetes mellitus without complication (HCC)    GERD (gastroesophageal reflux disease)    History of shingles    Hyperlipidemia    Hypertension    Sleep apnea    cannot tolerate CPAP    Past Surgical History:  Procedure Laterality Date   APPENDECTOMY     BREAST BIOPSY Bilateral    benign   BREAST EXCISIONAL BIOPSY Bilateral    benign   BREAST SURGERY     breast biopsy left , b/l excisional biopsy 1969   BUNIONECTOMY Bilateral    CESAREAN SECTION      Family History  Problem Relation Age of Onset   Diabetes Mother    Hypertension Mother    Hernia Mother        inguinal   Diabetes Father    Stroke Son    Aneurysm Paternal Aunt        brain   Cancer Sister        intestinal   Hypertension Sister    Diabetes Sister    Glaucoma Sister    Diabetes Sister    Hypertension Sister    Hyperlipidemia Sister    Diabetes Sister    Hypertension Sister    Hyperlipidemia Sister    Hernia Sister        umbilical   Hyperlipidemia Sister    Hypertension Sister    Diabetes Sister    Alcohol abuse Sister    Diabetes Sister    Hyperlipidemia Sister    Hypertension Sister    Obesity Sister    Stomach cancer Neg Hx    Colon cancer Neg Hx    Esophageal cancer Neg Hx    Colon polyps Neg Hx      Social History   Socioeconomic History   Marital status: Divorced    Spouse name: Not on file   Number of children: Not on file   Years of education: Not on file   Highest education level: Not on file  Occupational History   Occupation: Nurse    Comment: retired  Tobacco Use   Smoking status: Former    Types: Cigarettes   Smokeless tobacco: Never  Vaping Use   Vaping Use: Never used  Substance and Sexual Activity   Alcohol use: Yes    Comment: occ   Drug use: Never   Sexual activity: Not Currently  Other Topics Concern   Not on file  Social History Narrative   Avoids pork and shellfish and deep sea fish. Lives with family, daughter, wears seat belt always. Divorced, retired Marine scientist   Social Determinants of Radio broadcast assistant Strain: [place]  (03/30/2022)   Overall Financial Resource Strain (CARDIA)    Difficulty of Paying Living Expenses: Not hard  at all  Food Insecurity: No Food Insecurity (03/30/2022)   Hunger Vital Sign    Worried About Running Out of Food in the Last Year: Never true    Ran Out of Food in the Last Year: Never true  Transportation Needs: No Transportation Needs (03/30/2022)   PRAPARE - Hydrologist (Medical): No    Lack of Transportation (Non-Medical): No  Physical Activity: Insufficiently Active (03/30/2022)   Exercise Vital Sign    Days of Exercise per Week: 7 days    Minutes of Exercise per Session: 20 min  Stress: No Stress Concern Present (03/30/2022)   [place]    Feeling of Stress : Not at all  Social Connections: Socially Isolated (03/30/2022)   Social Connection and Isolation Panel [NHANES]    Frequency of Communication with Friends and Family: Three times a week    Frequency of Social Gatherings with Friends and Family: Once a week    Attends Religious Services: Never    Marine scientist or Organizations: No    Attends  Archivist Meetings: Never    Marital Status: Divorced  Human resources officer Violence: Not At Risk (03/30/2022)   Humiliation, Afraid, Rape, and Kick questionnaire    Fear of Current or Ex-Partner: No    Emotionally Abused: No    Physically Abused: No    Sexually Abused: No    Outpatient Medications Prior to Visit  Medication Sig Dispense Refill   albuterol (VENTOLIN HFA) 108 (90 Base) MCG/ACT inhaler USE 2 INHALATIONS BY MOUTH EVERY 6 HOURS AS NEEDED FOR WHEEZING  OR SHORTNESS OF BREATH 72 g 3   amLODipine (NORVASC) 2.5 MG tablet TAKE 1 TABLET BY MOUTH DAILY 90 tablet 3   Ascorbic Acid (VITAMIN C PO) Take by mouth.     benzonatate (TESSALON) 100 MG capsule TAKE 1 CAPSULE(100 MG) BY MOUTH THREE TIMES DAILY AS NEEDED FOR COUGH 30 capsule 1   cetirizine (ZYRTEC) 10 MG tablet Take 1 tablet (10 mg total) by mouth daily. 90 tablet 3   famotidine (PEPCID) 40 MG tablet TAKE 1 TABLET BY MOUTH AT  BEDTIME 90 tablet 3   furosemide (LASIX) 20 MG tablet TAKE 1 TABLET BY MOUTH DAILY AND A SECOND TABLET DAILY AS NEEDED  FOR PEDAL EDEMA OR WEIGHT GAIN  OF 3 LB IN 24 HOURS 180 tablet 3   gabapentin (NEURONTIN) 100 MG capsule TAKE 1 CAPSULE(100 MG) BY MOUTH THREE TIMES DAILY 90 capsule 3   hydrochlorothiazide (MICROZIDE) 12.5 MG capsule TAKE 1 CAPSULE BY MOUTH DAILY 90 capsule 3   metFORMIN (GLUCOPHAGE-XR) 500 MG 24 hr tablet TAKE 2 TABLETS BY MOUTH IN  THE MORNING AND 1 TABLET BY MOUTH IN THE EVENING 270 tablet 3   montelukast (SINGULAIR) 10 MG tablet TAKE 1 TABLET BY MOUTH AT  BEDTIME AS NEEDED FOR ALLERGIES 90 tablet 3   omeprazole (PRILOSEC) 40 MG capsule TAKE 1 CAPSULE BY MOUTH IN THE  MORNING AND AT BEDTIME 180 capsule 3   oxybutynin (DITROPAN-XL) 10 MG 24 hr tablet TAKE 1 TABLET(10 MG) BY MOUTH AT BEDTIME 30 tablet 2   potassium chloride SA (KLOR-CON M) 20 MEQ tablet TAKE 1 TABLET BY MOUTH DAILY 90 tablet 3   pregabalin (LYRICA) 50 MG capsule TAKE 1 CAPSULE(50 MG) BY MOUTH THREE TIMES DAILY 30  capsule 2   rosuvastatin (CRESTOR) 10 MG tablet TAKE 1 TABLET BY MOUTH DAILY 90 tablet 1  tiZANidine (ZANAFLEX) 2 MG tablet Take 0.5-1 tablets (1-2 mg total) by mouth at bedtime as needed for muscle spasms. 30 tablet 1   VITAMIN A PO Take by mouth.     VITAMIN B COMPLEX-C PO Take by mouth.     VITAMIN D PO Take by mouth.     No facility-administered medications prior to visit.    Allergies  Allergen Reactions   Iodine Hives   Shellfish Allergy Rash    Vomiting  Deep sea fish    Codeine     tinnitus   Demerol [Meperidine Hcl]     hyperactivity    Review of Systems  Constitutional:  Negative for fever and malaise/fatigue.  HENT:  Negative for congestion.   Eyes:  Negative for blurred vision.  Respiratory:  Negative for shortness of breath.   Cardiovascular:  Negative for chest pain, palpitations and leg swelling.  Gastrointestinal:  Negative for abdominal pain, blood in stool and nausea.  Genitourinary:  Negative for dysuria and frequency.  Musculoskeletal:  Positive for joint pain. Negative for back pain, falls and neck pain.  Skin:  Negative for rash.  Neurological:  Negative for dizziness, loss of consciousness and headaches.  Endo/Heme/Allergies:  Negative for environmental allergies.  Psychiatric/Behavioral:  Negative for depression. The patient is not nervous/anxious.        Objective:    Physical Exam Constitutional:      General: She is not in acute distress.    Appearance: Normal appearance. She is well-developed. She is not toxic-appearing.  HENT:     Head: Normocephalic and atraumatic.     Right Ear: External ear normal.     Left Ear: External ear normal.     Nose: Nose normal.  Eyes:     General:        Right eye: No discharge.        Left eye: No discharge.     Conjunctiva/sclera: Conjunctivae normal.  Neck:     Thyroid: No thyromegaly.  Cardiovascular:     Rate and Rhythm: Normal rate and regular rhythm.     Heart sounds: Murmur heard.   Pulmonary:     Effort: Pulmonary effort is normal. No respiratory distress.     Breath sounds: Normal breath sounds.  Abdominal:     General: Bowel sounds are normal.     Palpations: Abdomen is soft.     Tenderness: There is no abdominal tenderness. There is no guarding.  Musculoskeletal:        General: Normal range of motion.     Cervical back: Neck supple.  Lymphadenopathy:     Cervical: No cervical adenopathy.  Skin:    General: Skin is warm and dry.  Neurological:     Mental Status: She is alert and oriented to person, place, and time.  Psychiatric:        Mood and Affect: Mood normal.        Behavior: Behavior normal.        Thought Content: Thought content normal.        Judgment: Judgment normal.     BP 124/78 (BP Location: Right Arm, Patient Position: Sitting, Cuff Size: Normal)   Pulse 65   Temp 97.8 F (36.6 C) (Oral)   Resp 16   Ht 4\' 10"  (1.473 m)   Wt 158 lb 12.8 oz (72 kg)   SpO2 95%   BMI 33.19 kg/m  Wt Readings from Last 3 Encounters:  07/21/22 158 lb 12.8 oz (72 kg)  03/30/22 149 lb (67.6 kg)  01/18/22 152 lb 12.8 oz (69.3 kg)    Diabetic Foot Exam - Simple   No data filed    Lab Results  Component Value Date   WBC 6.9 01/18/2022   HGB 13.2 01/18/2022   HCT 41.8 01/18/2022   PLT 226.0 01/18/2022   GLUCOSE 120 (H) 01/18/2022   CHOL 170 01/18/2022   TRIG 111.0 01/18/2022   HDL 68.40 01/18/2022   LDLCALC 80 01/18/2022   ALT 10 01/18/2022   AST 14 01/18/2022   NA 142 01/18/2022   K 4.3 01/18/2022   CL 101 01/18/2022   CREATININE 0.88 01/18/2022   BUN 22 01/18/2022   CO2 30 01/18/2022   TSH 0.93 01/18/2022   HGBA1C 7.0 (H) 01/18/2022   MICROALBUR 3.0 (H) 01/18/2022    Lab Results  Component Value Date   TSH 0.93 01/18/2022   Lab Results  Component Value Date   WBC 6.9 01/18/2022   HGB 13.2 01/18/2022   HCT 41.8 01/18/2022   MCV 86.8 01/18/2022   PLT 226.0 01/18/2022   Lab Results  Component Value Date   NA 142 01/18/2022    K 4.3 01/18/2022   CO2 30 01/18/2022   GLUCOSE 120 (H) 01/18/2022   BUN 22 01/18/2022   CREATININE 0.88 01/18/2022   BILITOT 0.3 01/18/2022   ALKPHOS 112 01/18/2022   AST 14 01/18/2022   ALT 10 01/18/2022   PROT 7.6 01/18/2022   ALBUMIN 4.7 01/18/2022   CALCIUM 10.0 01/18/2022   GFR 59.21 (L) 01/18/2022   Lab Results  Component Value Date   CHOL 170 01/18/2022   Lab Results  Component Value Date   HDL 68.40 01/18/2022   Lab Results  Component Value Date   LDLCALC 80 01/18/2022   Lab Results  Component Value Date   TRIG 111.0 01/18/2022   Lab Results  Component Value Date   CHOLHDL 2 01/18/2022   Lab Results  Component Value Date   HGBA1C 7.0 (H) 01/18/2022       Assessment & Plan:  Diabetes mellitus without complication Assessment & Plan: hgba1c acceptable, minimize simple carbs. Increase exercise as tolerated. Continue current meds  Orders: -     Hemoglobin A1c -     Urine Microscopic  Hyperlipidemia, unspecified hyperlipidemia type Assessment & Plan: Encourage heart healthy diet such as MIND or DASH diet, increase exercise, avoid trans fats, simple carbohydrates and processed foods, consider a krill or fish or flaxseed oil cap daily. Tolerating statin  Orders: -     Lipid panel  Primary hypertension Assessment & Plan: Well controlled, no changes to meds. Encouraged heart healthy diet such as the DASH diet and exercise as tolerated.   Orders: -     CBC with Differential/Platelet -     Comprehensive metabolic panel -     TSH  Estrogen deficiency -     DG Bone Density; Future  Post-menopausal -     DG Bone Density; Future  Bilateral hip pain Assessment & Plan: Has developed hip pain and she will discuss with her orthopaedist as it is limiting her ability to ambulate.    Chronic pain of left knee Assessment & Plan: Sees Dr Marlou Sa soon with ortho and they have been treating her with injection with good results     Penni Homans, MD

## 2022-07-21 NOTE — Assessment & Plan Note (Signed)
Has developed hip pain and she will discuss with her orthopaedist as it is limiting her ability to ambulate.

## 2022-07-21 NOTE — Assessment & Plan Note (Signed)
Sees Dr Marlou Sa soon with ortho and they have been treating her with injection with good results

## 2022-08-04 ENCOUNTER — Other Ambulatory Visit: Payer: Self-pay | Admitting: Family Medicine

## 2022-08-11 ENCOUNTER — Ambulatory Visit (HOSPITAL_BASED_OUTPATIENT_CLINIC_OR_DEPARTMENT_OTHER)
Admission: RE | Admit: 2022-08-11 | Discharge: 2022-08-11 | Disposition: A | Discharge status: Home / Self Care | Payer: Medicare HMO | Source: Ambulatory Visit | Attending: Family Medicine | Admitting: Family Medicine

## 2022-08-11 DIAGNOSIS — Z780 Asymptomatic menopausal state: Secondary | ICD-10-CM | POA: Diagnosis present

## 2022-08-11 DIAGNOSIS — E2839 Other primary ovarian failure: Secondary | ICD-10-CM | POA: Insufficient documentation

## 2022-08-26 ENCOUNTER — Other Ambulatory Visit: Payer: Self-pay | Admitting: Family Medicine

## 2022-09-22 ENCOUNTER — Other Ambulatory Visit: Payer: Self-pay | Admitting: Family Medicine

## 2022-10-03 ENCOUNTER — Telehealth: Payer: Self-pay

## 2022-10-03 NOTE — Telephone Encounter (Signed)
VOB submitted for Euflexxa, left knee. 

## 2022-10-04 ENCOUNTER — Telehealth: Payer: Self-pay | Admitting: Orthopedic Surgery

## 2022-10-04 NOTE — Telephone Encounter (Signed)
Pt called requesting a call from Lauren F. Pt did not leave a reason for call back. Pt phone number is [phone]

## 2022-10-05 NOTE — Telephone Encounter (Signed)
I talked with patient. She had questions about what gel injection she would be receiving if her insurance approved. I did advise we could not schedule her yet as we did not have approval. Will you please notify her?

## 2022-10-05 NOTE — Telephone Encounter (Signed)
Talked with patient concerning gel injection.  Patient will need an updated Office visit to see if last injection helped.  Patient will call back to schedule for office visit.  PA is required for Euflexxa, left knee.  Will submit for PA after updated office visit.

## 2022-10-13 ENCOUNTER — Other Ambulatory Visit: Payer: Self-pay | Admitting: Family Medicine

## 2022-10-14 ENCOUNTER — Ambulatory Visit: Payer: Medicare HMO | Admitting: Orthopedic Surgery

## 2022-10-17 ENCOUNTER — Ambulatory Visit: Payer: Medicare HMO | Admitting: Orthopedic Surgery

## 2022-10-17 ENCOUNTER — Other Ambulatory Visit: Payer: Self-pay

## 2022-10-17 DIAGNOSIS — M25561 Pain in right knee: Secondary | ICD-10-CM

## 2022-10-17 DIAGNOSIS — M25562 Pain in left knee: Secondary | ICD-10-CM

## 2022-10-18 ENCOUNTER — Encounter: Payer: Self-pay | Admitting: Orthopedic Surgery

## 2022-10-18 NOTE — Progress Notes (Signed)
Office Visit Note   Patient: Abigail Mcpherson           Date of Birth: Aug 20, 1932           MRN: [id number] Visit Date: 10/17/2022 Requested by: Cammy Copa, MD [address] [place],  [place] [address] PCP: [name], MD  Subjective: Chief Complaint  Patient presents with   Right Knee - Pain   Left Knee - Pain    HPI: Abigail Mcpherson is a 89 y.o. female who presents to the office reporting left knee pain.  She did very well until the middle of May.  She states her gait is off due to the pain.  She is ambulating with a walking stick.  She reports more stiffness as well as constant pain.  She wants to discuss options.  Here with her daughter.  Patient does report increased weight due to decreased activity.  Left knee is hurting more than the right knee.  Difficulty going from the sitting to standing position.  Gel injection in December did help her..                ROS: All systems reviewed are negative as they relate to the chief complaint within the history of present illness.  Patient denies fevers or chills.  Assessment & Plan: Visit Diagnoses:  1. Pain in both knees, unspecified chronicity     Plan: Impression is bilateral knee pain left worse than right.  She does have progressive varus deformity and severe arthritis in both knees.  Will get her preapproved for for 3 shot gel injection.  Although she is 10 she does seem to be younger than that chronologically.  The stress on the body of elective joint replacement is discussed.  Patient is likely at higher than average risk because of her age.  She will consider her options and we will preapproved for for some gel shots in both knees and see her back to start that. This patient is diagnosed with osteoarthritis of the knee(s).    Radiographs show evidence of joint space narrowing, osteophytes, subchondral sclerosis and/or subchondral cysts.  This patient has knee pain which interferes with functional and activities of  daily living.    This patient has experienced inadequate response, adverse effects and/or intolerance with conservative treatments such as acetaminophen, NSAIDS, topical creams, physical therapy or regular exercise, knee bracing and/or weight loss.   This patient has experienced inadequate response or has a contraindication to intra articular steroid injections for at least 3 months.   This patient is not scheduled to have a total knee replacement within 6 months of starting treatment with viscosupplementation.   Follow-Up Instructions: No follow-ups on file.   Orders:  Orders Placed This Encounter  Procedures   XR Knee 1-2 Views Right   XR Knee 1-2 Views Left   No orders of the defined types were placed in this encounter.     Procedures: No procedures performed   Clinical Data: No additional findings.  Objective: Vital Signs: There were no vitals taken for this visit.  Physical Exam:  Constitutional: Patient appears well-developed HEENT:  Head: Normocephalic Eyes:EOM are normal Neck: Normal range of motion Cardiovascular: Normal rate Pulmonary/chest: Effort normal Neurologic: Patient is alert Skin: Skin is warm Psychiatric: Patient has normal mood and affect  Ortho Exam: Ortho exam demonstrates range of motion in both knees of 12 and 95.  Trace effusion is present.  Collateral cruciate ligaments are stable.  No masses lymphadenopathy or  skin changes noted in either knee region.  Varus alignment is present.  Specialty Comments:  No specialty comments available.  Imaging: No results found.   PMFS History: Patient Active Problem List   Diagnosis Date Noted   Bilateral hip pain 07/21/2022   Preventative health care 01/17/2022   Post herpetic neuralgia 05/20/2021   Hand arthritis 05/20/2021   Shingles 04/16/2021   Paresthesia of hand 11/11/2020   Insomnia 11/11/2020   Left foot pain 11/10/2020   Glaucoma 12/09/2019   Reflux esophagitis 12/09/2019   Pedal  edema 08/21/2018   Urinary incontinence 05/29/2018   Left knee pain 05/29/2018   Allergy    History of shingles    Diabetes mellitus without complication (HCC)    Hyperlipidemia    Primary hypertension    Cataract    Past Medical History:  Diagnosis Date   Allergy    Cataract    Diabetes mellitus without complication (HCC)    GERD (gastroesophageal reflux disease)    History of shingles    Hyperlipidemia    Hypertension    Sleep apnea    cannot tolerate CPAP    Family History  Problem Relation Age of Onset   Diabetes Mother    Hypertension Mother    Hernia Mother        inguinal   Diabetes Father    Stroke Son    Aneurysm Paternal Aunt        brain   Cancer Sister        intestinal   Hypertension Sister    Diabetes Sister    Glaucoma Sister    Diabetes Sister    Hypertension Sister    Hyperlipidemia Sister    Diabetes Sister    Hypertension Sister    Hyperlipidemia Sister    Hernia Sister        umbilical   Hyperlipidemia Sister    Hypertension Sister    Diabetes Sister    Alcohol abuse Sister    Diabetes Sister    Hyperlipidemia Sister    Hypertension Sister    Obesity Sister    Stomach cancer Neg Hx    Colon cancer Neg Hx    Esophageal cancer Neg Hx    Colon polyps Neg Hx     Past Surgical History:  Procedure Laterality Date   APPENDECTOMY     BREAST BIOPSY Bilateral    benign   BREAST EXCISIONAL BIOPSY Bilateral    benign   BREAST SURGERY     breast biopsy left , b/l excisional biopsy 1969   BUNIONECTOMY Bilateral    CESAREAN SECTION     Social History   Occupational History   Occupation: Engineer, civil (consulting)    Comment: retired  Tobacco Use   Smoking status: Former    Types: Cigarettes   Smokeless tobacco: Never  Vaping Use   Vaping Use: Never used  Substance and Sexual Activity   Alcohol use: Yes    Comment: occ   Drug use: Never   Sexual activity: Not Currently

## 2022-10-19 ENCOUNTER — Telehealth: Payer: Self-pay

## 2022-10-19 ENCOUNTER — Ambulatory Visit: Payer: Medicare HMO | Admitting: Podiatry

## 2022-10-19 DIAGNOSIS — E119 Type 2 diabetes mellitus without complications: Secondary | ICD-10-CM

## 2022-10-19 DIAGNOSIS — M79675 Pain in left toe(s): Secondary | ICD-10-CM

## 2022-10-19 DIAGNOSIS — M79674 Pain in right toe(s): Secondary | ICD-10-CM | POA: Diagnosis not present

## 2022-10-19 DIAGNOSIS — M2042 Other hammer toe(s) (acquired), left foot: Secondary | ICD-10-CM

## 2022-10-19 DIAGNOSIS — B351 Tinea unguium: Secondary | ICD-10-CM | POA: Diagnosis not present

## 2022-10-19 DIAGNOSIS — M2041 Other hammer toe(s) (acquired), right foot: Secondary | ICD-10-CM | POA: Diagnosis not present

## 2022-10-19 NOTE — Progress Notes (Signed)
  Subjective:  Patient ID: Abigail Mcpherson, female    DOB: 1932-11-27,  MRN: [id number]  Chief Complaint  Patient presents with   Diabetes    Foot exam    89 y.o. female returns for the above complaint.  Patient presents with thickened elongated dystrophic toenails x10.  Patient would like to have them to be done as she is not able to do so.  They are mildly painful to touch.  Painful when walking.  She is a type II diabetic with last A1c of 7.1.  She would also like to get a new pair of diabetic shoes as the previous ones are greater than-year-old  Objective:  There were no vitals filed for this visit. Podiatric Exam: Vascular: dorsalis pedis and posterior tibial pulses are palpable bilateral. Capillary return is immediate. Temperature gradient is WNL. Skin turgor WNL  Sensorium: Diminished Semmes Weinstein monofilament test.  Diminished tactile sensation bilaterally. Nail Exam: Pt has thick disfigured discolored nails with subungual debris noted bilateral entire nail hallux through fifth toenails.  Pain on palpation to the nails. Ulcer Exam: There is no evidence of ulcer or pre-ulcerative changes or infection. Orthopedic Exam: Muscle tone and strength are WNL. No limitations in general ROM. No crepitus or effusions noted. HAV  B/L.  Hammer toes 2-5  B/L. Skin: No Porokeratosis. No infection or ulcers    Assessment & Plan:   1. Diabetes mellitus without complication (HCC)   2. Pain due to onychomycosis of toenails of both feet   3. Hammertoe, bilateral        Patient was evaluated and treated and all questions answered.  Hammertoe contractures bilateral 2 through 5 -I explained to patient the etiology of contractures and various treatment options were discussed.  Given the patient is an uncontrolled diabetic with last A1c of 7.1 I believe she will benefit from diabetic shoes to evenly distribute the pressure and offload the contractures therefore preventing ulcerations in future  amputations.  Patient agrees with the plan. -Patient will be scheduled to get diabetic shoes  Onychomycosis with pain  -Nails palliatively debrided as below. -Educated on self-care  Procedure: Nail Debridement Rationale: pain  Type of Debridement: manual, sharp debridement. Instrumentation: Nail nipper, rotary burr. Number of Nails: 10  Procedures and Treatment: Consent by patient was obtained for treatment procedures. The patient understood the discussion of treatment and procedures well. All questions were answered thoroughly reviewed. Debridement of mycotic and hypertrophic toenails, 1 through 5 bilateral and clearing of subungual debris. No ulceration, no infection noted.  Return Visit-Office Procedure: Patient instructed to return to the office for a follow up visit 3 months for continued evaluation and treatment.  Nicholes Rough, DPM    No follow-ups on file.

## 2022-10-19 NOTE — Telephone Encounter (Signed)
Auth needed for series gel injection bilateral knees

## 2022-10-21 NOTE — Telephone Encounter (Signed)
VOB submitted for Euflexxa, bilateral knee.  

## 2022-10-24 ENCOUNTER — Ambulatory Visit: Payer: Medicare HMO | Admitting: Orthopedic Surgery

## 2022-10-28 ENCOUNTER — Ambulatory Visit: Payer: Medicare HMO | Admitting: Orthopedic Surgery

## 2022-11-15 ENCOUNTER — Other Ambulatory Visit: Payer: Self-pay | Admitting: Family Medicine

## 2022-11-15 DIAGNOSIS — Z1231 Encounter for screening mammogram for malignant neoplasm of breast: Secondary | ICD-10-CM

## 2022-11-24 ENCOUNTER — Other Ambulatory Visit: Payer: Self-pay

## 2022-11-24 DIAGNOSIS — M1712 Unilateral primary osteoarthritis, left knee: Secondary | ICD-10-CM

## 2022-11-24 DIAGNOSIS — M1711 Unilateral primary osteoarthritis, right knee: Secondary | ICD-10-CM

## 2022-11-30 ENCOUNTER — Telehealth: Payer: Self-pay | Admitting: Orthopedic Surgery

## 2022-11-30 NOTE — Telephone Encounter (Signed)
Patient called and wants to know can you get her in with getting the back to back shots. CB#[phone]

## 2022-12-01 NOTE — Telephone Encounter (Signed)
Called and left a VM advising patient that she has appointments scheduled for gel injections.  Advised to CB with any concerns.

## 2022-12-07 ENCOUNTER — Ambulatory Visit: Payer: Medicare HMO | Admitting: Surgical

## 2022-12-07 ENCOUNTER — Encounter: Payer: Self-pay | Admitting: Surgical

## 2022-12-07 ENCOUNTER — Encounter: Payer: Self-pay | Admitting: Otolaryngology

## 2022-12-07 DIAGNOSIS — M1711 Unilateral primary osteoarthritis, right knee: Secondary | ICD-10-CM

## 2022-12-07 DIAGNOSIS — M1712 Unilateral primary osteoarthritis, left knee: Secondary | ICD-10-CM

## 2022-12-07 DIAGNOSIS — M170 Bilateral primary osteoarthritis of knee: Secondary | ICD-10-CM

## 2022-12-07 MED ORDER — SODIUM HYALURONATE (VISCOSUP) 20 MG/2ML IX SOSY
20.0000 mg | PREFILLED_SYRINGE | INTRA_ARTICULAR | Status: AC | PRN
Start: 2022-12-07 — End: 2022-12-07
  Administered 2022-12-07: 20 mg via INTRA_ARTICULAR

## 2022-12-07 MED ORDER — LIDOCAINE HCL 1 % IJ SOLN
5.0000 mL | INTRAMUSCULAR | Status: AC | PRN
Start: 2022-12-07 — End: 2022-12-07
  Administered 2022-12-07: 5 mL

## 2022-12-07 NOTE — Progress Notes (Signed)
   Procedure Note  Patient: Abigail Mcpherson             Date of Birth: April 29, 1932           MRN: [id number]             Visit Date: 12/07/2022  Procedures: Visit Diagnoses:  1. Unilateral primary osteoarthritis, left knee   2. Unilateral primary osteoarthritis, right knee     Large Joint Inj: bilateral knee on 12/07/2022 1:08 PM Indications: diagnostic evaluation, joint swelling and pain Details: 18 G 1.5 in needle, superolateral approach  Arthrogram: No  Medications (Right): 5 mL lidocaine 1 %; 20 mg Sodium Hyaluronate (Viscosup) 20 MG/2ML Medications (Left): 5 mL lidocaine 1 %; 20 mg Sodium Hyaluronate (Viscosup) 20 MG/2ML Outcome: tolerated well, no immediate complications Procedure, treatment alternatives, risks and benefits explained, specific risks discussed. Consent was given by the patient. Immediately prior to procedure a time out was called to verify the correct patient, procedure, equipment, support staff and site/side marked as required. Patient was prepped and draped in the usual sterile fashion.

## 2022-12-13 ENCOUNTER — Ambulatory Visit
Admission: RE | Admit: 2022-12-13 | Discharge: 2022-12-13 | Disposition: A | Discharge status: Home / Self Care | Payer: Medicare HMO | Source: Ambulatory Visit | Attending: Family Medicine | Admitting: Family Medicine

## 2022-12-13 DIAGNOSIS — Z1231 Encounter for screening mammogram for malignant neoplasm of breast: Secondary | ICD-10-CM

## 2022-12-14 ENCOUNTER — Ambulatory Visit: Payer: Medicare HMO | Admitting: Orthopedic Surgery

## 2022-12-14 ENCOUNTER — Encounter: Payer: Self-pay | Admitting: Orthopedic Surgery

## 2022-12-14 DIAGNOSIS — M1711 Unilateral primary osteoarthritis, right knee: Secondary | ICD-10-CM | POA: Diagnosis not present

## 2022-12-14 DIAGNOSIS — M1712 Unilateral primary osteoarthritis, left knee: Secondary | ICD-10-CM | POA: Diagnosis not present

## 2022-12-14 MED ORDER — SODIUM HYALURONATE (VISCOSUP) 20 MG/2ML IX SOSY
20.0000 mg | PREFILLED_SYRINGE | INTRA_ARTICULAR | Status: AC | PRN
Start: 2022-12-14 — End: 2022-12-14
  Administered 2022-12-14: 20 mg via INTRA_ARTICULAR

## 2022-12-14 MED ORDER — SODIUM HYALURONATE (VISCOSUP) 20 MG/2ML IX SOSY
20.0000 mg | PREFILLED_SYRINGE | INTRA_ARTICULAR | Status: DC | PRN
Start: 2022-12-14 — End: 2022-12-14

## 2022-12-14 MED ORDER — LIDOCAINE HCL 1 % IJ SOLN
5.0000 mL | INTRAMUSCULAR | Status: AC | PRN
Start: 2022-12-14 — End: 2022-12-14
  Administered 2022-12-14: 5 mL

## 2022-12-14 MED ORDER — LIDOCAINE HCL 1 % IJ SOLN
5.0000 mL | INTRAMUSCULAR | Status: DC | PRN
Start: 2022-12-14 — End: 2022-12-14

## 2022-12-14 NOTE — Progress Notes (Signed)
   Procedure Note  Patient: Abigail Mcpherson             Date of Birth: 29-May-1932           MRN: [id number]             Visit Date: 12/14/2022  Procedures: Visit Diagnoses:  1. Unilateral primary osteoarthritis, left knee   2. Unilateral primary osteoarthritis, right knee     Large Joint Inj: bilateral knee on 12/14/2022 1:59 PM Indications: diagnostic evaluation, joint swelling and pain Details: 18 G 1.5 in needle, superolateral approach  Arthrogram: No  Medications (Right): 5 mL lidocaine 1 %; 20 mg Sodium Hyaluronate (Viscosup) 20 MG/2ML Medications (Left): 5 mL lidocaine 1 %; 20 mg Sodium Hyaluronate (Viscosup) 20 MG/2ML Outcome: tolerated well, no immediate complications Procedure, treatment alternatives, risks and benefits explained, specific risks discussed. Consent was given by the patient. Immediately prior to procedure a time out was called to verify the correct patient, procedure, equipment, support staff and site/side marked as required. Patient was prepped and draped in the usual sterile fashion.

## 2022-12-14 NOTE — Progress Notes (Deleted)
   Procedure Note  Patient: Abigail Mcpherson             Date of Birth: 13-Jan-1933           MRN: [id number]             Visit Date: 12/14/2022  Procedures: Visit Diagnoses:  1. Unilateral primary osteoarthritis, left knee   2. Unilateral primary osteoarthritis, right knee     Large Joint Inj: L knee on 12/14/2022 1:53 PM Indications: pain, joint swelling and diagnostic evaluation Details: 18 G 1.5 in needle, superolateral approach  Arthrogram: No  Medications: 5 mL lidocaine 1 %; 20 mg Sodium Hyaluronate (Viscosup) 20 MG/2ML Outcome: tolerated well, no immediate complications Procedure, treatment alternatives, risks and benefits explained, specific risks discussed. Consent was given by the patient. Immediately prior to procedure a time out was called to verify the correct patient, procedure, equipment, support staff and site/side marked as required. Patient was prepped and draped in the usual sterile fashion.

## 2022-12-21 ENCOUNTER — Ambulatory Visit: Payer: Medicare HMO | Admitting: Surgical

## 2022-12-21 DIAGNOSIS — M1712 Unilateral primary osteoarthritis, left knee: Secondary | ICD-10-CM

## 2022-12-21 DIAGNOSIS — M1711 Unilateral primary osteoarthritis, right knee: Secondary | ICD-10-CM

## 2022-12-23 ENCOUNTER — Encounter: Payer: Self-pay | Admitting: Surgical

## 2022-12-23 MED ORDER — SODIUM HYALURONATE (VISCOSUP) 20 MG/2ML IX SOSY
20.0000 mg | PREFILLED_SYRINGE | INTRA_ARTICULAR | Status: AC | PRN
Start: 2022-12-21 — End: 2022-12-21
  Administered 2022-12-21: 20 mg via INTRA_ARTICULAR

## 2022-12-23 MED ORDER — LIDOCAINE HCL 1 % IJ SOLN
5.0000 mL | INTRAMUSCULAR | Status: AC | PRN
Start: 2022-12-21 — End: 2022-12-21
  Administered 2022-12-21: 5 mL

## 2022-12-23 NOTE — Progress Notes (Signed)
   Procedure Note  Patient: Abigail Mcpherson             Date of Birth: [birth year]           MRN: [id number]             Visit Date: 12/21/2022  Procedures: Visit Diagnoses:  1. Unilateral primary osteoarthritis, left knee   2. Unilateral primary osteoarthritis, right knee     Large Joint Inj: bilateral knee on 12/21/2022 10:33 AM Indications: diagnostic evaluation, joint swelling and pain Details: 18 G 1.5 in needle, superolateral approach  Arthrogram: No  Medications (Right): 5 mL lidocaine 1 %; 20 mg Sodium Hyaluronate (Viscosup) 20 MG/2ML Medications (Left): 5 mL lidocaine 1 %; 20 mg Sodium Hyaluronate (Viscosup) 20 MG/2ML Outcome: tolerated well, no immediate complications Procedure, treatment alternatives, risks and benefits explained, specific risks discussed. Consent was given by the patient. Immediately prior to procedure a time out was called to verify the correct patient, procedure, equipment, support staff and site/side marked as required. Patient was prepped and draped in the usual sterile fashion.

## 2023-02-17 ENCOUNTER — Other Ambulatory Visit: Payer: Self-pay | Admitting: Family Medicine

## 2023-02-22 ENCOUNTER — Other Ambulatory Visit: Payer: Self-pay | Admitting: Emergency Medicine

## 2023-02-22 DIAGNOSIS — E785 Hyperlipidemia, unspecified: Secondary | ICD-10-CM

## 2023-02-22 DIAGNOSIS — E119 Type 2 diabetes mellitus without complications: Secondary | ICD-10-CM

## 2023-02-22 DIAGNOSIS — I10 Essential (primary) hypertension: Secondary | ICD-10-CM

## 2023-02-27 ENCOUNTER — Other Ambulatory Visit (INDEPENDENT_AMBULATORY_CARE_PROVIDER_SITE_OTHER): Payer: Medicare HMO

## 2023-02-27 DIAGNOSIS — I10 Essential (primary) hypertension: Secondary | ICD-10-CM | POA: Diagnosis not present

## 2023-02-27 DIAGNOSIS — M8580 Other specified disorders of bone density and structure, unspecified site: Secondary | ICD-10-CM | POA: Insufficient documentation

## 2023-02-27 DIAGNOSIS — E119 Type 2 diabetes mellitus without complications: Secondary | ICD-10-CM

## 2023-02-27 DIAGNOSIS — E785 Hyperlipidemia, unspecified: Secondary | ICD-10-CM

## 2023-02-27 LAB — CBC WITH DIFFERENTIAL/PLATELET
Basophils Absolute: 0 10*3/uL (ref 0.0–0.1)
Basophils Relative: 0.2 % (ref 0.0–3.0)
Eosinophils Absolute: 0.1 10*3/uL (ref 0.0–0.7)
Eosinophils Relative: 1.4 % (ref 0.0–5.0)
HCT: 39 % (ref 36.0–46.0)
Hemoglobin: 12.7 g/dL (ref 12.0–15.0)
Lymphocytes Relative: 35.4 % (ref 12.0–46.0)
Lymphs Abs: 2.3 10*3/uL (ref 0.7–4.0)
MCHC: 32.4 g/dL (ref 30.0–36.0)
MCV: 86.8 fL (ref 78.0–100.0)
Monocytes Absolute: 0.5 10*3/uL (ref 0.1–1.0)
Monocytes Relative: 7 % (ref 3.0–12.0)
Neutro Abs: 3.7 10*3/uL (ref 1.4–7.7)
Neutrophils Relative %: 56 % (ref 43.0–77.0)
Platelets: 227 10*3/uL (ref 150.0–400.0)
RBC: 4.5 Mil/uL (ref 3.87–5.11)
RDW: 14.8 % (ref 11.5–15.5)
WBC: 6.6 10*3/uL (ref 4.0–10.5)

## 2023-02-27 LAB — COMPREHENSIVE METABOLIC PANEL
ALT: 11 U/L (ref 0–35)
AST: 16 U/L (ref 0–37)
Albumin: 4.4 g/dL (ref 3.5–5.2)
Alkaline Phosphatase: 97 U/L (ref 39–117)
BUN: 20 mg/dL (ref 6–23)
CO2: 30 meq/L (ref 19–32)
Calcium: 9.7 mg/dL (ref 8.4–10.5)
Chloride: 102 meq/L (ref 96–112)
Creatinine, Ser: 0.86 mg/dL (ref 0.40–1.20)
GFR: 60.39 mL/min (ref >60.00)
Glucose, Bld: 126 mg/dL — ABNORMAL HIGH (ref 70–99)
Potassium: 4.5 meq/L (ref 3.5–5.1)
Sodium: 141 meq/L (ref 135–145)
Total Bilirubin: 0.4 mg/dL (ref 0.2–1.2)
Total Protein: 7.3 g/dL (ref 6.0–8.3)

## 2023-02-27 LAB — LIPID PANEL
Cholesterol: 161 mg/dL (ref 0–200)
HDL: 63 mg/dL (ref >39.00)
LDL Cholesterol: 83 mg/dL (ref 0–99)
NonHDL: 98.35
Total CHOL/HDL Ratio: 3
Triglycerides: 78 mg/dL (ref 0.0–149.0)
VLDL: 15.6 mg/dL (ref 0.0–40.0)

## 2023-02-27 LAB — URINALYSIS, MICROSCOPIC ONLY: RBC / HPF: NONE SEEN (ref 0–?)

## 2023-02-27 LAB — HEMOGLOBIN A1C: Hgb A1c MFr Bld: 7 % — ABNORMAL HIGH (ref 4.6–6.5)

## 2023-02-27 LAB — TSH: TSH: 1.07 u[IU]/mL (ref 0.35–5.50)

## 2023-02-27 NOTE — Assessment & Plan Note (Signed)
Patient encouraged to maintain heart healthy diet, regular exercise, adequate sleep. Consider daily probiotics. Take medications as prescribed. Labs ordered and reviewed.  Has aged out of screening colonoscopy, MGM and paps Dexa 07/2022 Consider RSV (respiratory syncitial virus) vaccine at pharmacy, Arexvy Covid booster  new version At pharmacy High dose flu shot mid Sept and on  Shingrix is the new shingles shot, 2 shots over 2-6 months, confirm coverage with insurance and document, then can return here for shots with nurse appt or at pharmacy

## 2023-02-27 NOTE — Assessment & Plan Note (Signed)
Encourage heart healthy diet such as MIND or DASH diet, increase exercise, avoid trans fats, simple carbohydrates and processed foods, consider a krill or fish or flaxseed oil cap daily. Tolerating statin 

## 2023-02-27 NOTE — Assessment & Plan Note (Signed)
hgba1c acceptable, minimize simple carbs. Increase exercise as tolerated. Continue current meds 

## 2023-02-27 NOTE — Assessment & Plan Note (Signed)
Well controlled, no changes to meds. Encouraged heart healthy diet such as the DASH diet and exercise as tolerated.  °

## 2023-02-27 NOTE — Assessment & Plan Note (Signed)
Encouraged to get adequate exercise, calcium and vitamin d intake 

## 2023-02-28 ENCOUNTER — Encounter: Payer: Self-pay | Admitting: Family Medicine

## 2023-02-28 ENCOUNTER — Ambulatory Visit (INDEPENDENT_AMBULATORY_CARE_PROVIDER_SITE_OTHER): Payer: Medicare HMO | Admitting: Family Medicine

## 2023-02-28 VITALS — BP 130/82 | HR 68 | Temp 98.2°F | Resp 18 | Wt 155.8 lb

## 2023-02-28 DIAGNOSIS — Z0000 Encounter for general adult medical examination without abnormal findings: Secondary | ICD-10-CM

## 2023-02-28 DIAGNOSIS — M8580 Other specified disorders of bone density and structure, unspecified site: Secondary | ICD-10-CM | POA: Diagnosis not present

## 2023-02-28 DIAGNOSIS — E119 Type 2 diabetes mellitus without complications: Secondary | ICD-10-CM

## 2023-02-28 DIAGNOSIS — E785 Hyperlipidemia, unspecified: Secondary | ICD-10-CM | POA: Diagnosis not present

## 2023-02-28 DIAGNOSIS — Z7984 Long term (current) use of oral hypoglycemic drugs: Secondary | ICD-10-CM

## 2023-02-28 DIAGNOSIS — I10 Essential (primary) hypertension: Secondary | ICD-10-CM | POA: Diagnosis not present

## 2023-02-28 MED ORDER — OZEMPIC (0.25 OR 0.5 MG/DOSE) 2 MG/3ML ~~LOC~~ SOPN: 0.2500 mg | PEN_INJECTOR | SUBCUTANEOUS | 2 refills | Status: DC

## 2023-02-28 MED ORDER — METFORMIN HCL ER 500 MG PO TB24: ORAL_TABLET | ORAL | 1 refills | Status: DC

## 2023-02-28 NOTE — Patient Instructions (Addendum)
RSV, Respiratory Syncitial Virus vaccine, Arexvy at pharmacy  Shingrix is the new shingles shot, 2 shots over 2-6 months, confirm coverage with insurance and document, then can return here for shots with nurse appt or at pharmacy   Annual flu and covid at pharmacy   Tetanus at pharmacy   L Tryptophan for sleep  Preventive Care 65 Years and Older, Female Preventive care refers to lifestyle choices and visits with your health care provider that can promote health and wellness. Preventive care visits are also called wellness exams. What can I expect for my preventive care visit? Counseling Your health care provider may ask you questions about your: Medical history, including: Past medical problems. Family medical history. Pregnancy and menstrual history. History of falls. Current health, including: Memory and ability to understand (cognition). Emotional well-being. Home life and relationship well-being. Sexual activity and sexual health. Lifestyle, including: Alcohol, nicotine or tobacco, and drug use. Access to firearms. Diet, exercise, and sleep habits. Work and work Astronomer. Sunscreen use. Safety issues such as seatbelt and bike helmet use. Physical exam Your health care provider will check your: Height and weight. These may be used to calculate your BMI (body mass index). BMI is a measurement that tells if you are at a healthy weight. Waist circumference. This measures the distance around your waistline. This measurement also tells if you are at a healthy weight and may help predict your risk of certain diseases, such as type 2 diabetes and high blood pressure. Heart rate and blood pressure. Body temperature. Skin for abnormal spots. What immunizations do I need?  Vaccines are usually given at various ages, according to a schedule. Your health care provider will recommend vaccines for you based on your age, medical history, and lifestyle or other factors, such as travel  or where you work. What tests do I need? Screening Your health care provider may recommend screening tests for certain conditions. This may include: Lipid and cholesterol levels. Hepatitis C test. Hepatitis B test. HIV (human immunodeficiency virus) test. STI (sexually transmitted infection) testing, if you are at risk. Lung cancer screening. Colorectal cancer screening. Diabetes screening. This is done by checking your blood sugar (glucose) after you have not eaten for a while (fasting). Mammogram. Talk with your health care provider about how often you should have regular mammograms. BRCA-related cancer screening. This may be done if you have a family history of breast, ovarian, tubal, or peritoneal cancers. Bone density scan. This is done to screen for osteoporosis. Talk with your health care provider about your test results, treatment options, and if necessary, the need for more tests. Follow these instructions at home: Eating and drinking  Eat a diet that includes fresh fruits and vegetables, whole grains, lean protein, and low-fat dairy products. Limit your intake of foods with high amounts of sugar, saturated fats, and salt. Take vitamin and mineral supplements as recommended by your health care provider. Do not drink alcohol if your health care provider tells you not to drink. If you drink alcohol: Limit how much you have to 0-1 drink a day. Know how much alcohol is in your drink. In the [place] one drink equals one 12 oz bottle of beer (355 mL), one 5 oz glass of wine (148 mL), or one 1 oz glass of hard liquor (44 mL). Lifestyle Brush your teeth every morning and night with fluoride toothpaste. Floss one time each day. Exercise for at least 30 minutes 5 or more days each week. Do not use any products  that contain nicotine or tobacco. These products include cigarettes, chewing tobacco, and vaping devices, such as e-cigarettes. If you need help quitting, ask your health care  provider. Do not use drugs. If you are sexually active, practice safe sex. Use a condom or other form of protection in order to prevent STIs. Take aspirin only as told by your health care provider. Make sure that you understand how much to take and what form to take. Work with your health care provider to find out whether it is safe and beneficial for you to take aspirin daily. Ask your health care provider if you need to take a cholesterol-lowering medicine (statin). Find healthy ways to manage stress, such as: Meditation, yoga, or listening to music. Journaling. Talking to a trusted person. Spending time with friends and family. Minimize exposure to UV radiation to reduce your risk of skin cancer. Safety Always wear your seat belt while driving or riding in a vehicle. Do not drive: If you have been drinking alcohol. Do not ride with someone who has been drinking. When you are tired or distracted. While texting. If you have been using any mind-altering substances or drugs. Wear a helmet and other protective equipment during sports activities. If you have firearms in your house, make sure you follow all gun safety procedures. What's next? Visit your health care provider once a year for an annual wellness visit. Ask your health care provider how often you should have your eyes and teeth checked. Stay up to date on all vaccines. This information is not intended to replace advice given to you by your health care provider. Make sure you discuss any questions you have with your health care provider. Document Revised: 09/30/2020 Document Reviewed: 09/30/2020 Elsevier Patient Education  2024 [employer].

## 2023-02-28 NOTE — Progress Notes (Signed)
Subjective:    Patient ID: Abigail Mcpherson, female    DOB: Nov 25, 1932, 89 y.o.   MRN: [id number]  Chief Complaint  Patient presents with  . Annual Exam    Concerns/ questions: 1. back pain- right side. 2. Tingling in the Great toe (sees Podiatry) Flu shot today: declines Eye exam: Dr Joseph Art, [place] [facility] exam is due AWV due after 04/01/23 (Shingrix, Td due- Medicare pt)    HPI Discussed the use of AI scribe software for clinical note transcription with the patient, who gave verbal consent to proceed.  History of Present Illness   The patient, with a known history of hypertension and diabetes, presents with concerns about their blood pressure and blood sugar levels. They have been monitoring their blood pressure at home and have noticed some fluctuations, with readings ranging from 113 to 165. The patient has been adjusting their medication intake based on their diet, particularly when consuming carbohydrates. They report taking an extra metformin pill when they consume more carbohydrates than usual. The patient also reports poor sleep, consuming only about three hours a night. They express a fear of progressing to insulin therapy for their diabetes. The patient also mentions a recent shingles outbreak, which lasted for about nine to ten months.        Past Medical History:  Diagnosis Date  . Allergy   . Cataract   . Diabetes mellitus without complication (HCC)   . GERD (gastroesophageal reflux disease)   . History of shingles   . Hyperlipidemia   . Hypertension   . Sleep apnea    cannot tolerate CPAP    Past Surgical History:  Procedure Laterality Date  . APPENDECTOMY    . BREAST BIOPSY Bilateral    benign  . BREAST EXCISIONAL BIOPSY Bilateral    benign  . BREAST SURGERY     breast biopsy left , b/l excisional biopsy 1969  . BUNIONECTOMY Bilateral   . CESAREAN SECTION      Family History  Problem Relation Age of Onset  . Diabetes Mother   . Hypertension Mother    . Hernia Mother        inguinal  . Diabetes Father   . Cancer Sister        intestinal  . Hypertension Sister   . Diabetes Sister   . Glaucoma Sister   . Diabetes Sister   . Hypertension Sister   . Hyperlipidemia Sister   . Heart disease Sister   . Diabetes Sister   . Hypertension Sister   . Hyperlipidemia Sister   . Hernia Sister        umbilical  . Hyperlipidemia Sister   . Hypertension Sister   . Diabetes Sister   . Alcohol abuse Sister   . Diabetes Sister   . Hyperlipidemia Sister   . Hypertension Sister   . Obesity Sister   . Stroke Son   . Aneurysm Paternal Aunt        brain  . Stomach cancer Neg Hx   . Colon cancer Neg Hx   . Esophageal cancer Neg Hx   . Colon polyps Neg Hx     Social History   Socioeconomic History  . Marital status: Divorced    Spouse name: Not on file  . Number of children: Not on file  . Years of education: Not on file  . Highest education level: Not on file  Occupational History  . Occupation: Nurse    Comment: retired  Tobacco Use  .  Smoking status: Former    Types: Cigarettes  . Smokeless tobacco: Never  Vaping Use  . Vaping status: Never Used  Substance and Sexual Activity  . Alcohol use: Yes    Comment: occ  . Drug use: Never  . Sexual activity: Not Currently  Other Topics Concern  . Not on file  Social History Narrative   Avoids pork and shellfish and deep sea fish. Lives with family, daughter, wears seat belt always. Divorced, retired Engineer, civil (consulting)   Social Determinants of Corporate investment banker Strain: Low Risk  (03/30/2022)   Overall Financial Resource Strain (CARDIA)   . Difficulty of Paying Living Expenses: Not hard at all  Food Insecurity: No Food Insecurity (03/30/2022)   Hunger Vital Sign   . Worried About Programme researcher, broadcasting/film/video in the Last Year: Never true   . Ran Out of Food in the Last Year: Never true  Transportation Needs: No Transportation Needs (03/30/2022)   PRAPARE - Transportation   . Lack of  Transportation (Medical): No   . Lack of Transportation (Non-Medical): No  Physical Activity: Insufficiently Active (03/30/2022)   Exercise Vital Sign   . Days of Exercise per Week: 7 days   . Minutes of Exercise per Session: 20 min  Stress: No Stress Concern Present (03/30/2022)   [employer] of Occupational Health - Occupational Stress Questionnaire   . Feeling of Stress : Not at all  Social Connections: Socially Isolated (03/30/2022)   Social Connection and Isolation Panel [NHANES]   . Frequency of Communication with Friends and Family: Three times a week   . Frequency of Social Gatherings with Friends and Family: Once a week   . Attends Religious Services: Never   . Active Member of Clubs or Organizations: No   . Attends Banker Meetings: Never   . Marital Status: Divorced  Catering manager Violence: Not At Risk (03/30/2022)   Humiliation, Afraid, Rape, and Kick questionnaire   . Fear of Current or Ex-Partner: No   . Emotionally Abused: No   . Physically Abused: No   . Sexually Abused: No    Outpatient Medications Prior to Visit  Medication Sig Dispense Refill  . albuterol (VENTOLIN HFA) 108 (90 Base) MCG/ACT inhaler USE 2 INHALATIONS BY MOUTH EVERY 6 HOURS AS NEEDED FOR WHEEZING  OR SHORTNESS OF BREATH 72 g 3  . amLODipine (NORVASC) 2.5 MG tablet TAKE 1 TABLET BY MOUTH DAILY 90 tablet 3  . Ascorbic Acid (VITAMIN C PO) Take by mouth.    . benzonatate (TESSALON) 100 MG capsule TAKE 1 CAPSULE(100 MG) BY MOUTH THREE TIMES DAILY AS NEEDED FOR COUGH 30 capsule 1  . cetirizine (ZYRTEC) 10 MG tablet Take 1 tablet (10 mg total) by mouth daily. 90 tablet 3  . famotidine (PEPCID) 40 MG tablet TAKE 1 TABLET BY MOUTH AT  BEDTIME 90 tablet 3  . furosemide (LASIX) 20 MG tablet TAKE 1 TABLET BY MOUTH DAILY AND A SECOND TABLET DAILY AS NEEDED  FOR PEDAL EDEMA OR WEIGHT GAIN  OF 3 LB IN 24 HOURS 180 tablet 3  . hydrochlorothiazide (MICROZIDE) 12.5 MG capsule TAKE 1 CAPSULE BY  MOUTH DAILY 90 capsule 3  . montelukast (SINGULAIR) 10 MG tablet TAKE 1 TABLET BY MOUTH AT  BEDTIME AS NEEDED FOR ALLERGIES 90 tablet 3  . omeprazole (PRILOSEC) 40 MG capsule TAKE 1 CAPSULE BY MOUTH IN THE  MORNING AND AT BEDTIME 180 capsule 3  . oxybutynin (DITROPAN-XL) 10 MG 24 hr tablet TAKE  1 TABLET(10 MG) BY MOUTH AT BEDTIME 30 tablet 2  . potassium chloride SA (KLOR-CON M) 20 MEQ tablet TAKE 1 TABLET BY MOUTH DAILY 90 tablet 3  . rosuvastatin (CRESTOR) 10 MG tablet Take 1 tablet (10 mg total) by mouth daily. 90 tablet 0  . tiZANidine (ZANAFLEX) 2 MG tablet Take 0.5-1 tablets (1-2 mg total) by mouth at bedtime as needed for muscle spasms. 30 tablet 1  . VITAMIN A PO Take by mouth.    Marland Kitchen VITAMIN B COMPLEX-C PO Take by mouth.    Marland Kitchen VITAMIN D PO Take by mouth.    . metFORMIN (GLUCOPHAGE-XR) 500 MG 24 hr tablet TAKE 2 TABLETS BY MOUTH IN  THE MORNING AND 1 TABLET BY MOUTH IN THE EVENING 270 tablet 3  . gabapentin (NEURONTIN) 100 MG capsule TAKE 1 CAPSULE(100 MG) BY MOUTH THREE TIMES DAILY (Patient not taking: Reported on 02/28/2023) 90 capsule 3  . pregabalin (LYRICA) 50 MG capsule TAKE 1 CAPSULE(50 MG) BY MOUTH THREE TIMES DAILY (Patient not taking: Reported on 02/28/2023) 30 capsule 2   No facility-administered medications prior to visit.    Allergies  Allergen Reactions  . Iodine Hives  . Shellfish Allergy Rash    Vomiting  Deep sea fish   . Codeine     tinnitus  . Demerol [Meperidine Hcl]     hyperactivity    Review of Systems  Constitutional:  Negative for chills, fever and malaise/fatigue.  HENT:  Negative for congestion and hearing loss.   Eyes:  Negative for discharge.  Respiratory:  Negative for cough, sputum production and shortness of breath.   Cardiovascular:  Negative for chest pain, palpitations and leg swelling.  Gastrointestinal:  Negative for abdominal pain, blood in stool, constipation, diarrhea, heartburn, nausea and vomiting.  Genitourinary:  Negative for  dysuria, frequency, hematuria and urgency.  Musculoskeletal:  Positive for back pain and joint pain. Negative for falls and myalgias.  Skin:  Negative for rash.  Neurological:  Negative for dizziness, sensory change, loss of consciousness, weakness and headaches.  Endo/Heme/Allergies:  Negative for environmental allergies. Does not bruise/bleed easily.  Psychiatric/Behavioral:  Negative for depression and suicidal ideas. The patient is not nervous/anxious and does not have insomnia.        Objective:    Physical Exam Constitutional:      General: She is not in acute distress.    Appearance: Normal appearance. She is not diaphoretic.  HENT:     Head: Normocephalic and atraumatic.     Right Ear: Tympanic membrane, ear canal and external ear normal.     Left Ear: Tympanic membrane, ear canal and external ear normal.     Nose: Nose normal.     Mouth/Throat:     Mouth: Mucous membranes are moist.     Pharynx: Oropharynx is clear. No oropharyngeal exudate.  Eyes:     General: No scleral icterus.       Right eye: No discharge.        Left eye: No discharge.     Conjunctiva/sclera: Conjunctivae normal.     Pupils: Pupils are equal, round, and reactive to light.  Neck:     Thyroid: No thyromegaly.  Cardiovascular:     Rate and Rhythm: Normal rate and regular rhythm.     Heart sounds: Normal heart sounds. No murmur heard. Pulmonary:     Effort: Pulmonary effort is normal. No respiratory distress.     Breath sounds: Normal breath sounds. No wheezing or rales.  Abdominal:     General: Bowel sounds are normal. There is no distension.     Palpations: Abdomen is soft. There is no mass.     Tenderness: There is no abdominal tenderness.  Musculoskeletal:        General: No tenderness. Normal range of motion.     Cervical back: Normal range of motion and neck supple.  Lymphadenopathy:     Cervical: No cervical adenopathy.  Skin:    General: Skin is warm and dry.     Findings: No rash.   Neurological:     General: No focal deficit present.     Mental Status: She is alert and oriented to person, place, and time.     Cranial Nerves: No cranial nerve deficit.     Coordination: Coordination normal.     Deep Tendon Reflexes: Reflexes are normal and symmetric. Reflexes normal.  Psychiatric:        Mood and Affect: Mood normal.        Behavior: Behavior normal.        Thought Content: Thought content normal.        Judgment: Judgment normal.    BP 130/82 (BP Location: Left Arm, Patient Position: Sitting, Cuff Size: Normal)   Pulse 68   Temp 98.2 F (36.8 C) (Temporal)   Resp 18   Wt 155 lb 12.8 oz (70.7 kg)   SpO2 96%   BMI 32.56 kg/m  Wt Readings from Last 3 Encounters:  02/28/23 155 lb 12.8 oz (70.7 kg)  07/21/22 158 lb 12.8 oz (72 kg)  03/30/22 149 lb (67.6 kg)    Diabetic Foot Exam - Simple   No data filed    Lab Results  Component Value Date   WBC 6.6 02/27/2023   HGB 12.7 02/27/2023   HCT 39.0 02/27/2023   PLT 227.0 02/27/2023   GLUCOSE 126 (H) 02/27/2023   CHOL 161 02/27/2023   TRIG 78.0 02/27/2023   HDL 63.00 02/27/2023   LDLCALC 83 02/27/2023   ALT 11 02/27/2023   AST 16 02/27/2023   NA 141 02/27/2023   K 4.5 02/27/2023   CL 102 02/27/2023   CREATININE 0.86 02/27/2023   BUN 20 02/27/2023   CO2 30 02/27/2023   TSH 1.07 02/27/2023   HGBA1C 7.0 (H) 02/27/2023   MICROALBUR 3.0 (H) 01/18/2022    Lab Results  Component Value Date   TSH 1.07 02/27/2023   Lab Results  Component Value Date   WBC 6.6 02/27/2023   HGB 12.7 02/27/2023   HCT 39.0 02/27/2023   MCV 86.8 02/27/2023   PLT 227.0 02/27/2023   Lab Results  Component Value Date   NA 141 02/27/2023   K 4.5 02/27/2023   CO2 30 02/27/2023   GLUCOSE 126 (H) 02/27/2023   BUN 20 02/27/2023   CREATININE 0.86 02/27/2023   BILITOT 0.4 02/27/2023   ALKPHOS 97 02/27/2023   AST 16 02/27/2023   ALT 11 02/27/2023   PROT 7.3 02/27/2023   ALBUMIN 4.4 02/27/2023   CALCIUM 9.7  02/27/2023   GFR 60.39 02/27/2023   Lab Results  Component Value Date   CHOL 161 02/27/2023   Lab Results  Component Value Date   HDL 63.00 02/27/2023   Lab Results  Component Value Date   LDLCALC 83 02/27/2023   Lab Results  Component Value Date   TRIG 78.0 02/27/2023   Lab Results  Component Value Date   CHOLHDL 3 02/27/2023   Lab Results  Component Value  Date   HGBA1C 7.0 (H) 02/27/2023       Assessment & Plan:  Diabetes mellitus without complication (HCC) Assessment & Plan: hgba1c acceptable, minimize simple carbs. Increase exercise as tolerated. Continue current meds   Hyperlipidemia, unspecified hyperlipidemia type Assessment & Plan: Encourage heart healthy diet such as MIND or DASH diet, increase exercise, avoid trans fats, simple carbohydrates and processed foods, consider a krill or fish or flaxseed oil cap daily. Tolerating statin   Primary hypertension Assessment & Plan: Well controlled, no changes to meds. Encouraged heart healthy diet such as the DASH diet and exercise as tolerated.    Preventative health care Assessment & Plan: Patient encouraged to maintain heart healthy diet, regular exercise, adequate sleep. Consider daily probiotics. Take medications as prescribed. Labs ordered and reviewed.  Has aged out of screening colonoscopy, MGM and paps Dexa 07/2022 Consider RSV (respiratory syncitial virus) vaccine at pharmacy, Arexvy Covid booster  new version At pharmacy High dose flu shot mid Sept and on  Shingrix is the new shingles shot, 2 shots over 2-6 months, confirm coverage with insurance and document, then can return here for shots with nurse appt or at pharmacy    Osteopenia, unspecified location Assessment & Plan: Encouraged to get adequate exercise, calcium and vitamin d intake    Other orders -     metFORMIN HCl ER; TAKE 2 TABLETS BY MOUTH IN  THE MORNING AND 1 TAB IN THE EVENING  Dispense: 270 tablet; Refill: 1 -     Ozempic  (0.25 or 0.5 MG/DOSE); Inject 0.25 mg into the skin once a week.  Dispense: 3 mL; Refill: 2    Assessment and Plan    Hypertension Blood pressure readings varying between 113-165. Patient concerned about readings above 120. Discussed the acceptable range for blood pressure and the risks of aggressive treatment. -Continue current management and monitor blood pressure at home.  Type 2 Diabetes Mellitus Blood glucose readings varying, with the highest being 165. Patient adjusts Metformin dosage based on carbohydrate intake. Discussed the risks of inconsistent dosing and the target range for blood glucose levels. -Continue Metformin 2 in am and 1 in pm daily. -Add Ozempic once weekly for improved glycemic control, potential weight loss, and cardiovascular benefits. -Check blood glucose levels regularly at home.  Dehydration Clinical signs of dehydration observed during the physical examination. -Increase fluid intake to 60-80 ounces daily.  Insomnia Patient reports poor sleep quality. -Trial of L-tryptophan supplement for sleep improvement.  General Health Maintenance -Consider receiving COVID-19 and influenza vaccines. -Consider receiving RSV and shingles vaccines. -Consider hearing test with [facility]. -Encourage regular exercise, such as chair yoga. -Schedule a virtual visit in 3 months to assess response to Ozempic. -Schedule an in-person visit in 6 months for a comprehensive review.         Danise Edge, MD

## 2023-03-01 ENCOUNTER — Telehealth: Payer: Self-pay | Admitting: Family Medicine

## 2023-03-01 NOTE — Telephone Encounter (Signed)
Pt called to go over 11.11.24 lab results. All CMAs were unavailable at the time and advised a message would be sent back to give her a call.

## 2023-03-02 NOTE — Telephone Encounter (Signed)
Left message on machine to call back  

## 2023-03-03 NOTE — Telephone Encounter (Signed)
Called Abigail Mcpherson this morning. She had not seen 3 of her lab results. I read them over the phone for her (CBC, TSH, Lipid Panel). She was informed they were all within normal range

## 2023-03-06 ENCOUNTER — Encounter: Payer: Self-pay | Admitting: *Deleted

## 2023-03-06 ENCOUNTER — Other Ambulatory Visit: Payer: Self-pay | Admitting: *Deleted

## 2023-03-06 MED ORDER — BENZONATATE 100 MG PO CAPS: ORAL_CAPSULE | ORAL | 1 refills | Status: DC

## 2023-03-06 NOTE — Progress Notes (Signed)
Advised pt that urine culture was not done.  Per pt she is not having any urinary symptoms.

## 2023-03-06 NOTE — Telephone Encounter (Signed)
Pt would like a refill on tessalon perles.  She stated that she usually just takes 3 times week to help with cough.

## 2023-03-08 ENCOUNTER — Encounter: Payer: Self-pay | Admitting: Family Medicine

## 2023-03-29 ENCOUNTER — Telehealth: Payer: Self-pay | Admitting: Family Medicine

## 2023-03-29 NOTE — Telephone Encounter (Signed)
Copied from CRM [id number]. Topic: Medicare AWV >> Mar 29, 2023  9:22 AM Payton Doughty wrote: Reason for CRM: Called LVM 03/29/2023 to schedule AWV TELEHEALTH ONLY  Verlee Rossetti; Care Guide Ambulatory Clinical Support [place] [facility] Direct Dial: [phone]

## 2023-04-21 ENCOUNTER — Other Ambulatory Visit: Payer: Self-pay | Admitting: Family Medicine

## 2023-06-04 NOTE — Assessment & Plan Note (Signed)
 hgba1c acceptable, minimize simple carbs. Increase exercise as tolerated. Continue current meds

## 2023-06-04 NOTE — Assessment & Plan Note (Signed)
 Encourage heart healthy diet such as MIND or DASH diet, increase exercise, avoid trans fats, simple carbohydrates and processed foods, consider a krill or fish or flaxseed oil cap daily. Tolerating statin

## 2023-06-04 NOTE — Assessment & Plan Note (Signed)
 Encouraged good sleep hygiene such as dark, quiet room. No blue/green glowing lights such as computer screens in bedroom. No alcohol or stimulants in evening. Cut down on caffeine as able. Regular exercise is helpful but not just prior to bed time.

## 2023-06-04 NOTE — Assessment & Plan Note (Signed)
 Encouraged to get adequate exercise, calcium and vitamin d intake

## 2023-06-04 NOTE — Assessment & Plan Note (Signed)
 Well controlled, no changes to meds. Encouraged heart healthy diet such as the DASH diet and exercise as tolerated.

## 2023-06-06 ENCOUNTER — Telehealth: Payer: Medicare HMO | Admitting: Family Medicine

## 2023-06-06 ENCOUNTER — Telehealth (INDEPENDENT_AMBULATORY_CARE_PROVIDER_SITE_OTHER): Payer: Medicare HMO | Admitting: Family Medicine

## 2023-06-06 ENCOUNTER — Encounter: Payer: Self-pay | Admitting: Family Medicine

## 2023-06-06 VITALS — BP 127/86 | HR 79

## 2023-06-06 DIAGNOSIS — I10 Essential (primary) hypertension: Secondary | ICD-10-CM

## 2023-06-06 DIAGNOSIS — E785 Hyperlipidemia, unspecified: Secondary | ICD-10-CM | POA: Diagnosis not present

## 2023-06-06 DIAGNOSIS — E119 Type 2 diabetes mellitus without complications: Secondary | ICD-10-CM

## 2023-06-06 DIAGNOSIS — M8580 Other specified disorders of bone density and structure, unspecified site: Secondary | ICD-10-CM

## 2023-06-06 DIAGNOSIS — G4700 Insomnia, unspecified: Secondary | ICD-10-CM

## 2023-06-06 NOTE — Progress Notes (Signed)
 Virtual Visit via Video Note   This patient is at least at moderate risk for complications without adequate follow up. This format is felt to be most appropriate for this patient at this time. Physical exam was limited by quality of the video and audio technology used for the visit. Juanetta, CMA was able to get the patient set up on a video visit.  Patient location: home  Patient and provider in visit Provider location: Office  I discussed the limitations of evaluation and management by telemedicine and the availability of in person appointments. The patient expressed understanding and agreed to proceed.  Visit Date: 06/06/2023  Today's healthcare provider: Danise Edge, MD  Subjective:    Patient ID: Abigail Mcpherson, female    DOB: June 16, 1932, 89 y.o.   MRN: [id number]  Chief Complaint  Patient presents with   Follow-up    Patient took blood sugar this morning. It was 97    HPI Discussed the use of AI scribe software for clinical note transcription with the patient, who gave verbal consent to proceed.  History of Present Illness   The patient, with a history of diabetes, has been started on a new injectable medication. She reports a decreased appetite since starting the medication, which has led to some weight loss. The patient's appetite was already diminishing prior to starting the medication, but it has become more pronounced since. The patient is not experiencing any nausea, vomiting, or diarrhea. She also reports difficulty with word recall, which has been noticed by her daughter. The patient is currently taking fish oil and vitamins A, C, and D.        Past Medical History:  Diagnosis Date   Allergy    Cataract    Diabetes mellitus without complication (HCC)    GERD (gastroesophageal reflux disease)    History of shingles    Hyperlipidemia    Hypertension    Sleep apnea    cannot tolerate CPAP    Past Surgical History:  Procedure Laterality Date    APPENDECTOMY     BREAST BIOPSY Bilateral    benign   BREAST EXCISIONAL BIOPSY Bilateral    benign   BREAST SURGERY     breast biopsy left , b/l excisional biopsy 1969   BUNIONECTOMY Bilateral    CESAREAN SECTION      Family History  Problem Relation Age of Onset   Diabetes Mother    Hypertension Mother    Hernia Mother        inguinal   Diabetes Father    Cancer Sister        intestinal   Hypertension Sister    Diabetes Sister    Glaucoma Sister    Diabetes Sister    Hypertension Sister    Hyperlipidemia Sister    Heart disease Sister    Diabetes Sister    Hypertension Sister    Hyperlipidemia Sister    Hernia Sister        umbilical   Hyperlipidemia Sister    Hypertension Sister    Diabetes Sister    Alcohol abuse Sister    Diabetes Sister    Hyperlipidemia Sister    Hypertension Sister    Obesity Sister    Stroke Son    Aneurysm Paternal Aunt        brain   Stomach cancer Neg Hx    Colon cancer Neg Hx    Esophageal cancer Neg Hx    Colon polyps Neg Hx  Social History   Socioeconomic History   Marital status: Divorced    Spouse name: Not on file   Number of children: Not on file   Years of education: Not on file   Highest education level: Not on file  Occupational History   Occupation: Nurse    Comment: retired  Tobacco Use   Smoking status: Former    Types: Cigarettes   Smokeless tobacco: Never  Vaping Use   Vaping status: Never Used  Substance and Sexual Activity   Alcohol use: Yes    Comment: occ   Drug use: Never   Sexual activity: Not Currently  Other Topics Concern   Not on file  Social History Narrative   Avoids pork and shellfish and deep sea fish. Lives with family, daughter, wears seat belt always. Divorced, retired Engineer, civil (consulting)   Social Drivers of Corporate investment banker Strain: Low Risk  (03/30/2022)   Overall Financial Resource Strain (CARDIA)    Difficulty of Paying Living Expenses: Not hard at all  Food Insecurity: No  Food Insecurity (03/30/2022)   Hunger Vital Sign    Worried About Running Out of Food in the Last Year: Never true    Ran Out of Food in the Last Year: Never true  Transportation Needs: No Transportation Needs (03/30/2022)   PRAPARE - Administrator, Civil Service (Medical): No    Lack of Transportation (Non-Medical): No  Physical Activity: Insufficiently Active (03/30/2022)   Exercise Vital Sign    Days of Exercise per Week: 7 days    Minutes of Exercise per Session: 20 min  Stress: No Stress Concern Present (03/30/2022)   [employer] of Occupational Health - Occupational Stress Questionnaire    Feeling of Stress : Not at all  Social Connections: Socially Isolated (03/30/2022)   Social Connection and Isolation Panel [NHANES]    Frequency of Communication with Friends and Family: Three times a week    Frequency of Social Gatherings with Friends and Family: Once a week    Attends Religious Services: Never    Database administrator or Organizations: No    Attends Banker Meetings: Never    Marital Status: Divorced  Catering manager Violence: Not At Risk (03/30/2022)   Humiliation, Afraid, Rape, and Kick questionnaire    Fear of Current or Ex-Partner: No    Emotionally Abused: No    Physically Abused: No    Sexually Abused: No    Outpatient Medications Prior to Visit  Medication Sig Dispense Refill   albuterol (VENTOLIN HFA) 108 (90 Base) MCG/ACT inhaler USE 2 INHALATIONS BY MOUTH EVERY 6 HOURS AS NEEDED FOR WHEEZING  OR SHORTNESS OF BREATH 72 g 3   amLODipine (NORVASC) 2.5 MG tablet TAKE 1 TABLET BY MOUTH DAILY 90 tablet 3   Ascorbic Acid (VITAMIN C PO) Take by mouth.     benzonatate (TESSALON) 100 MG capsule TAKE 1 CAPSULE BY MOUTH 3 TIMES  DAILY AS NEEDED FOR COUGH 60 capsule 0   cetirizine (ZYRTEC) 10 MG tablet Take 1 tablet (10 mg total) by mouth daily. 90 tablet 3   famotidine (PEPCID) 40 MG tablet Take 1 tablet (40 mg total) by mouth at bedtime.  90 tablet 1   furosemide (LASIX) 20 MG tablet TAKE 1 TABLET BY MOUTH DAILY AND A SECOND TABLET DAILY AS NEEDED  FOR PEDAL EDEMA OR WEIGHT GAIN  OF 3 LB IN 24 HOURS 180 tablet 3   hydrochlorothiazide (MICROZIDE) 12.5 MG capsule TAKE  1 CAPSULE BY MOUTH DAILY 90 capsule 3   metFORMIN (GLUCOPHAGE-XR) 500 MG 24 hr tablet TAKE 2 TABLETS BY MOUTH IN THE  MORNING AND 1 TABLET BY MOUTH IN THE EVENING 270 tablet 3   montelukast (SINGULAIR) 10 MG tablet TAKE 1 TABLET BY MOUTH AT  BEDTIME AS NEEDED FOR ALLERGIES 90 tablet 3   omeprazole (PRILOSEC) 40 MG capsule TAKE 1 CAPSULE BY MOUTH IN THE  MORNING AND AT BEDTIME 180 capsule 3   oxybutynin (DITROPAN-XL) 10 MG 24 hr tablet TAKE 1 TABLET(10 MG) BY MOUTH AT BEDTIME 30 tablet 2   potassium chloride SA (KLOR-CON M) 20 MEQ tablet TAKE 1 TABLET BY MOUTH DAILY 90 tablet 3   rosuvastatin (CRESTOR) 10 MG tablet TAKE 1 TABLET BY MOUTH DAILY 90 tablet 3   Semaglutide,0.25 or 0.5MG /DOS, (OZEMPIC, 0.25 OR 0.5 MG/DOSE,) 2 MG/3ML SOPN Inject 0.25 mg into the skin once a week. 3 mL 2   tiZANidine (ZANAFLEX) 2 MG tablet Take 0.5-1 tablets (1-2 mg total) by mouth at bedtime as needed for muscle spasms. 30 tablet 1   VITAMIN A PO Take by mouth.     VITAMIN B COMPLEX-C PO Take by mouth.     VITAMIN D PO Take by mouth.     No facility-administered medications prior to visit.    Allergies  Allergen Reactions   Iodine Hives   Shellfish Allergy Rash    Vomiting  Deep sea fish    Codeine     tinnitus   Demerol [Meperidine Hcl]     hyperactivity    Review of Systems  Constitutional:  Negative for fever and malaise/fatigue.  HENT:  Negative for congestion.   Eyes:  Negative for blurred vision.  Respiratory:  Negative for shortness of breath.   Cardiovascular:  Negative for chest pain, palpitations and leg swelling.  Gastrointestinal:  Negative for abdominal pain, blood in stool and nausea.  Genitourinary:  Negative for dysuria and frequency.  Musculoskeletal:   Negative for falls.  Skin:  Negative for rash.  Neurological:  Negative for dizziness, loss of consciousness and headaches.  Endo/Heme/Allergies:  Negative for environmental allergies.  Psychiatric/Behavioral:  Positive for memory loss. Negative for depression. The patient is not nervous/anxious.        Objective:    Physical Exam  BP 127/86 Comment: Pt obtained  Pulse 79 Comment: Pt obtained  SpO2 97% Comment: Pt obtained Wt Readings from Last 3 Encounters:  02/28/23 155 lb 12.8 oz (70.7 kg)  07/21/22 158 lb 12.8 oz (72 kg)  03/30/22 149 lb (67.6 kg)    Diabetic Foot Exam - Simple   No data filed    Lab Results  Component Value Date   WBC 6.6 02/27/2023   HGB 12.7 02/27/2023   HCT 39.0 02/27/2023   PLT 227.0 02/27/2023   GLUCOSE 126 (H) 02/27/2023   CHOL 161 02/27/2023   TRIG 78.0 02/27/2023   HDL 63.00 02/27/2023   LDLCALC 83 02/27/2023   ALT 11 02/27/2023   AST 16 02/27/2023   NA 141 02/27/2023   K 4.5 02/27/2023   CL 102 02/27/2023   CREATININE 0.86 02/27/2023   BUN 20 02/27/2023   CO2 30 02/27/2023   TSH 1.07 02/27/2023   HGBA1C 7.0 (H) 02/27/2023   MICROALBUR 3.0 (H) 01/18/2022    Lab Results  Component Value Date   TSH 1.07 02/27/2023   Lab Results  Component Value Date   WBC 6.6 02/27/2023   HGB 12.7 02/27/2023  HCT 39.0 02/27/2023   MCV 86.8 02/27/2023   PLT 227.0 02/27/2023   Lab Results  Component Value Date   NA 141 02/27/2023   K 4.5 02/27/2023   CO2 30 02/27/2023   GLUCOSE 126 (H) 02/27/2023   BUN 20 02/27/2023   CREATININE 0.86 02/27/2023   BILITOT 0.4 02/27/2023   ALKPHOS 97 02/27/2023   AST 16 02/27/2023   ALT 11 02/27/2023   PROT 7.3 02/27/2023   ALBUMIN 4.4 02/27/2023   CALCIUM 9.7 02/27/2023   GFR 60.39 02/27/2023   Lab Results  Component Value Date   CHOL 161 02/27/2023   Lab Results  Component Value Date   HDL 63.00 02/27/2023   Lab Results  Component Value Date   LDLCALC 83 02/27/2023   Lab Results   Component Value Date   TRIG 78.0 02/27/2023   Lab Results  Component Value Date   CHOLHDL 3 02/27/2023   Lab Results  Component Value Date   HGBA1C 7.0 (H) 02/27/2023       Assessment & Plan:  Diabetes mellitus without complication (HCC) Assessment & Plan: hgba1c acceptable, minimize simple carbs. Increase exercise as tolerated. Continue current meds  Orders: -     Microalbumin / creatinine urine ratio; Future -     Hemoglobin A1c; Future  Hyperlipidemia, unspecified hyperlipidemia type Assessment & Plan: Encourage heart healthy diet such as MIND or DASH diet, increase exercise, avoid trans fats, simple carbohydrates and processed foods, consider a krill or fish or flaxseed oil cap daily. Tolerating statin  Orders: -     Lipid panel; Future  Osteopenia, unspecified location Assessment & Plan: Encouraged to get adequate exercise, calcium and vitamin d intake    Primary hypertension Assessment & Plan: Well controlled, no changes to meds. Encouraged heart healthy diet such as the DASH diet and exercise as tolerated.   Orders: -     TSH; Future -     CBC with Differential/Platelet; Future -     Comprehensive metabolic panel; Future  Insomnia, unspecified type Assessment & Plan: Encouraged good sleep hygiene such as dark, quiet room. No blue/green glowing lights such as computer screens in bedroom. No alcohol or stimulants in evening. Cut down on caffeine as able. Regular exercise is helpful but not just prior to bed time.      Assessment and Plan    Type 2 Diabetes Mellitus Patient on injectable medication with notable side effect of decreased appetite. Weight loss observed. No hypoglycemic episodes reported. Discussed the importance of regular protein intake and hydration. -Continue current medication regimen. -Encourage regular protein intake every 4 hours. -Check blood glucose levels regularly. -Plan to increase medication dose to 0.5mg  after 6 weeks if  tolerated. -Repeat blood work in March.  Cognitive Concerns Patient reported difficulty with word recall. Discussed the potential benefits of a high-quality multivitamin, fish oil, and cognitive stimulation through learning new skills. -Consider starting a high-quality multivitamin and fish oil supplement. -Encourage cognitive stimulation through learning new activities.  General Health Maintenance -Physical examination scheduled for November. -Continue to monitor weight, blood pressure, and blood glucose levels regularly. -Encourage regular hydration (10 ounces every 1-2 hours).         Danise Edge, MD

## 2023-06-28 ENCOUNTER — Ambulatory Visit: Payer: Medicare HMO | Admitting: Podiatry

## 2023-06-30 ENCOUNTER — Encounter: Payer: Self-pay | Admitting: Podiatry

## 2023-06-30 ENCOUNTER — Ambulatory Visit: Payer: Medicare HMO | Admitting: Podiatry

## 2023-06-30 DIAGNOSIS — M79675 Pain in left toe(s): Secondary | ICD-10-CM | POA: Diagnosis not present

## 2023-06-30 DIAGNOSIS — M79674 Pain in right toe(s): Secondary | ICD-10-CM | POA: Diagnosis not present

## 2023-06-30 DIAGNOSIS — B351 Tinea unguium: Secondary | ICD-10-CM

## 2023-06-30 DIAGNOSIS — E119 Type 2 diabetes mellitus without complications: Secondary | ICD-10-CM | POA: Diagnosis not present

## 2023-06-30 NOTE — Progress Notes (Signed)
  Subjective:  Patient ID: Abigail Mcpherson, female    DOB: Jul 19, 1932,  MRN: [id number]  Chief Complaint  Patient presents with   Ingrown Toenail    Pt has possible ingrown toenail to left great toe and toe nail trim. No other cmplaints   89 y.o. female returns for the above complaint.  Patient presents with thickened elongated dystrophic toenails x10.  Patient would like to have them to be done as she is not able to do so.  They are mildly painful to touch.  Painful when walking.  She is a type II diabetic with last A1c of 7.1.  She would also like to get a new pair of diabetic shoes as the previous ones are greater than-year-old  Objective:  There were no vitals filed for this visit. Podiatric Exam: Vascular: dorsalis pedis and posterior tibial pulses are palpable bilateral. Capillary return is immediate. Temperature gradient is WNL. Skin turgor WNL  Sensorium: Diminished Semmes Weinstein monofilament test.  Diminished tactile sensation bilaterally. Nail Exam: Pt has thick disfigured discolored nails with subungual debris noted bilateral entire nail hallux through fifth toenails.  Pain on palpation to the nails. Ulcer Exam: There is no evidence of ulcer or pre-ulcerative changes or infection. Orthopedic Exam: Muscle tone and strength are WNL. No limitations in general ROM. No crepitus or effusions noted. HAV  B/L.  Hammer toes 2-5  B/L. Skin: No Porokeratosis. No infection or ulcers    Assessment & Plan:   No diagnosis found.      Patient was evaluated and treated and all questions answered.  Hammertoe contractures bilateral 2 through 5 -I explained to patient the etiology of contractures and various treatment options were discussed.  Given the patient is an uncontrolled diabetic with last A1c of 7.1 I believe she will benefit from diabetic shoes to evenly distribute the pressure and offload the contractures therefore preventing ulcerations in future amputations.  Patient agrees  with the plan. -Patient will be scheduled to get diabetic shoes  Onychomycosis with pain  -Nails palliatively debrided as below. -Educated on self-care  Procedure: Nail Debridement Rationale: pain  Type of Debridement: manual, sharp debridement. Instrumentation: Nail nipper, rotary burr. Number of Nails: 10  Procedures and Treatment: Consent by patient was obtained for treatment procedures. The patient understood the discussion of treatment and procedures well. All questions were answered thoroughly reviewed. Debridement of mycotic and hypertrophic toenails, 1 through 5 bilateral and clearing of subungual debris. No ulceration, no infection noted.  Return Visit-Office Procedure: Patient instructed to return to the office for a follow up visit 3 months for continued evaluation and treatment.  Nicholes Rough, DPM    No follow-ups on file.

## 2023-08-20 ENCOUNTER — Encounter: Payer: Self-pay | Admitting: Family Medicine

## 2023-08-21 ENCOUNTER — Other Ambulatory Visit: Payer: Self-pay | Admitting: Family Medicine

## 2023-08-21 MED ORDER — OZEMPIC (0.25 OR 0.5 MG/DOSE) 2 MG/3ML ~~LOC~~ SOPN: 0.2500 mg | PEN_INJECTOR | SUBCUTANEOUS | 2 refills | Status: DC

## 2023-08-28 ENCOUNTER — Other Ambulatory Visit (INDEPENDENT_AMBULATORY_CARE_PROVIDER_SITE_OTHER)

## 2023-08-28 ENCOUNTER — Encounter: Payer: Self-pay | Admitting: Family Medicine

## 2023-08-28 DIAGNOSIS — E119 Type 2 diabetes mellitus without complications: Secondary | ICD-10-CM | POA: Diagnosis not present

## 2023-08-28 DIAGNOSIS — E785 Hyperlipidemia, unspecified: Secondary | ICD-10-CM

## 2023-08-28 DIAGNOSIS — I10 Essential (primary) hypertension: Secondary | ICD-10-CM

## 2023-08-28 LAB — CBC WITH DIFFERENTIAL/PLATELET
Basophils Absolute: 0 10*3/uL (ref 0.0–0.1)
Basophils Relative: 0.4 % (ref 0.0–3.0)
Eosinophils Absolute: 0.1 10*3/uL (ref 0.0–0.7)
Eosinophils Relative: 0.8 % (ref 0.0–5.0)
HCT: 40.2 % (ref 36.0–46.0)
Hemoglobin: 12.9 g/dL (ref 12.0–15.0)
Lymphocytes Relative: 31.2 % (ref 12.0–46.0)
Lymphs Abs: 2.1 10*3/uL (ref 0.7–4.0)
MCHC: 32.1 g/dL (ref 30.0–36.0)
MCV: 87.8 fl (ref 78.0–100.0)
Monocytes Absolute: 0.4 10*3/uL (ref 0.1–1.0)
Monocytes Relative: 6.8 % (ref 3.0–12.0)
Neutro Abs: 4 10*3/uL (ref 1.4–7.7)
Neutrophils Relative %: 60.8 % (ref 43.0–77.0)
Platelets: 244 10*3/uL (ref 150.0–400.0)
RBC: 4.58 Mil/uL (ref 3.87–5.11)
RDW: 14.4 % (ref 11.5–15.5)
WBC: 6.6 10*3/uL (ref 4.0–10.5)

## 2023-08-28 LAB — COMPREHENSIVE METABOLIC PANEL WITH GFR
ALT: 11 U/L (ref 0–35)
AST: 15 U/L (ref 0–37)
Albumin: 4.5 g/dL (ref 3.5–5.2)
Alkaline Phosphatase: 85 U/L (ref 39–117)
BUN: 17 mg/dL (ref 6–23)
CO2: 27 meq/L (ref 19–32)
Calcium: 9.4 mg/dL (ref 8.4–10.5)
Chloride: 101 meq/L (ref 96–112)
Creatinine, Ser: 0.8 mg/dL (ref 0.40–1.20)
GFR: 65.64 mL/min
Glucose, Bld: 108 mg/dL — ABNORMAL HIGH (ref 70–99)
Potassium: 3.9 meq/L (ref 3.5–5.1)
Sodium: 142 meq/L (ref 135–145)
Total Bilirubin: 0.3 mg/dL (ref 0.2–1.2)
Total Protein: 7.2 g/dL (ref 6.0–8.3)

## 2023-08-28 LAB — TSH: TSH: 1.44 u[IU]/mL (ref 0.35–5.50)

## 2023-08-28 LAB — LIPID PANEL
Cholesterol: 163 mg/dL (ref 0–200)
HDL: 68.6 mg/dL (ref >39.00)
LDL Cholesterol: 81 mg/dL (ref 0–99)
NonHDL: 94.6
Total CHOL/HDL Ratio: 2
Triglycerides: 70 mg/dL (ref 0.0–149.0)
VLDL: 14 mg/dL (ref 0.0–40.0)

## 2023-08-28 LAB — MICROALBUMIN / CREATININE URINE RATIO
Creatinine,U: 26.5 mg/dL
Microalb Creat Ratio: UNDETERMINED mg/g (ref 0.0–30.0)
Microalb, Ur: <0.7 mg/dL

## 2023-08-28 LAB — HEMOGLOBIN A1C: Hgb A1c MFr Bld: 6.4 % (ref 4.6–6.5)

## 2023-08-29 ENCOUNTER — Ambulatory Visit: Payer: Self-pay

## 2023-08-31 ENCOUNTER — Encounter: Payer: Self-pay | Admitting: Family Medicine

## 2023-08-31 ENCOUNTER — Ambulatory Visit: Payer: Medicare HMO | Admitting: Family Medicine

## 2023-08-31 VITALS — BP 120/66 | HR 67 | Resp 16 | Ht <= 58 in | Wt 151.0 lb

## 2023-08-31 DIAGNOSIS — M8580 Other specified disorders of bone density and structure, unspecified site: Secondary | ICD-10-CM | POA: Diagnosis not present

## 2023-08-31 DIAGNOSIS — E119 Type 2 diabetes mellitus without complications: Secondary | ICD-10-CM

## 2023-08-31 DIAGNOSIS — E785 Hyperlipidemia, unspecified: Secondary | ICD-10-CM | POA: Diagnosis not present

## 2023-08-31 DIAGNOSIS — R3915 Urgency of urination: Secondary | ICD-10-CM | POA: Diagnosis not present

## 2023-08-31 DIAGNOSIS — Z7984 Long term (current) use of oral hypoglycemic drugs: Secondary | ICD-10-CM

## 2023-08-31 DIAGNOSIS — G4700 Insomnia, unspecified: Secondary | ICD-10-CM

## 2023-08-31 NOTE — Assessment & Plan Note (Signed)
 Encouraged to get adequate exercise, calcium and vitamin d intake

## 2023-08-31 NOTE — Assessment & Plan Note (Signed)
 Encouraged good sleep hygiene such as dark, quiet room. No blue/green glowing lights such as computer screens in bedroom. No alcohol or stimulants in evening. Cut down on caffeine as able. Regular exercise is helpful but not just prior to bed time.

## 2023-08-31 NOTE — Assessment & Plan Note (Signed)
 hgba1c acceptable, minimize simple carbs. Increase exercise as tolerated. Continue current meds

## 2023-08-31 NOTE — Assessment & Plan Note (Signed)
 Encourage heart healthy diet such as MIND or DASH diet, increase exercise, avoid trans fats, simple carbohydrates and processed foods, consider a krill or fish or flaxseed oil cap daily. Tolerating statin

## 2023-08-31 NOTE — Patient Instructions (Addendum)
 5 ounces of water every 1 hour Protein every 3-4 hours  Urinary Tract Infection, Female A urinary tract infection (UTI) is an infection in your urinary tract. The urinary tract is made up of organs that make, store, and get rid of pee (urine) in your body. These organs include: The kidneys. The ureters. The bladder. The urethra. What are the causes? Most UTIs are caused by germs called bacteria. They may be in or near your genitals. These germs grow and cause swelling in your urinary tract. What increases the risk? You're more likely to get a UTI if: You're a female. The urethra is shorter in females than in males. You have a soft tube called a catheter that drains your pee. You can't control when you pee or poop. You have trouble peeing because of: A kidney stone. A urinary blockage. A nerve condition that affects your bladder. Not getting enough to drink. You're sexually active. You use a birth control inside your vagina, like spermicide. You're pregnant. You have low levels of the hormone estrogen in your body. You're an older adult. You're also more likely to get a UTI if you have other health problems. These may include: Diabetes. A weak immune system. Your immune system is your body's defense system. Sickle cell disease. Injury of the spine. What are the signs or symptoms? Symptoms may include: Needing to pee right away. Peeing small amounts often. Pain or burning when you pee. Blood in your pee. Pee that smells bad or odd. Pain in your belly or lower back. You may also: Feel confused. This may be the first symptom in older adults. Vomit. Not feel hungry. Feel tired or easily annoyed. Have a fever or chills. How is this diagnosed? A UTI is diagnosed based on your medical history and an exam. You may also have other tests. These may include: Pee tests. Blood tests. Tests for sexually transmitted infections (STIs). If you've had more than one UTI, you may need to  have imaging studies done to find out why you keep getting them. How is this treated? A UTI can be treated by: Taking antibiotics or other medicines. Drinking enough fluid to keep your pee pale yellow. In rare cases, a UTI can cause a very bad condition called sepsis. Sepsis may be treated in the hospital. Follow these instructions at home: Medicines Take your medicines only as told by your health care provider. If you were given antibiotics, take them as told by your provider. Do not stop taking them even if you start to feel better. General instructions Make sure you: Pee often and fully. Do not hold your pee for a long time. Wipe from front to back after you pee or poop. Use each tissue only once when you wipe. Pee after you have sex. Do not douche or use sprays or powders in your genital area. Contact a health care provider if: Your symptoms don't get better after 1-2 days of taking antibiotics. Your symptoms go away and then come back. You have a fever or chills. You vomit or feel like you may vomit. Get help right away if: You have very bad pain in your back or lower belly. You faint. This information is not intended to replace advice given to you by your health care provider. Make sure you discuss any questions you have with your health care provider. Document Revised: 11/10/2022 Document Reviewed: 07/08/2022 Elsevier Patient Education  2024 [employer].

## 2023-09-01 LAB — URINALYSIS, ROUTINE W REFLEX MICROSCOPIC
Bilirubin Urine: NEGATIVE
Hgb urine dipstick: NEGATIVE
Leukocytes,Ua: NEGATIVE
Nitrite: NEGATIVE
Specific Gravity, Urine: 1.02 (ref 1.000–1.030)
Total Protein, Urine: NEGATIVE
Urine Glucose: NEGATIVE
Urobilinogen, UA: 0.2 (ref 0.0–1.0)
pH: 6 (ref 5.0–8.0)

## 2023-09-01 LAB — URINE CULTURE
MICRO NUMBER:: 16460499
SPECIMEN QUALITY:: ADEQUATE

## 2023-09-03 ENCOUNTER — Ambulatory Visit: Payer: Self-pay | Admitting: Family

## 2023-09-03 ENCOUNTER — Encounter: Payer: Self-pay | Admitting: Family Medicine

## 2023-09-03 NOTE — Progress Notes (Signed)
 Subjective:    Patient ID: Abigail Mcpherson, female    DOB: March 09, 1933, 89 y.o.   MRN: [id number]  Chief Complaint  Patient presents with  . Medical Management of Chronic Issues    Patient presents today for a 6 month follow-up  . Quality Metric Gaps    AWV, foot & eye exam,zoster,TDAP    HPI Discussed the use of AI scribe software for clinical note transcription with the patient, who gave verbal consent to proceed.  History of Present Illness Abigail Mcpherson is an 89 year old female who presents with dizziness and balance issues.  Dizziness and balance issues began three days ago, described as a sensation of 'water in her ear' and feeling 'woozy' rather than vertigo. Symptoms persist, causing unsteadiness and requiring pauses while walking short distances. No nausea, congestion, fever, or significant headache, although a mild headache was noted prior to symptom onset. No vision changes or tinnitus.  She experienced an episode of urinary urgency, nearly wetting herself, but denies dysuria. Increased fatigue is noted, feeling tired by the end of the day despite not engaging in strenuous activities. She has been trying to increase water intake, suspecting dehydration might be contributing to her symptoms.  Her family member notes a decrease in confidence and reluctance to engage in familiar activities, although no significant personality changes or confusion are observed.  She is currently taking Ozempic , which has been associated with a steady decrease in blood sugar levels.    Past Medical History:  Diagnosis Date  . Allergy   . Cataract   . Diabetes mellitus without complication (HCC)   . GERD (gastroesophageal reflux disease)   . History of shingles   . Hyperlipidemia   . Hypertension   . Sleep apnea    cannot tolerate CPAP    Past Surgical History:  Procedure Laterality Date  . APPENDECTOMY    . BREAST BIOPSY Bilateral    benign  . BREAST EXCISIONAL BIOPSY  Bilateral    benign  . BREAST SURGERY     breast biopsy left , b/l excisional biopsy 1969  . BUNIONECTOMY Bilateral   . CESAREAN SECTION      Family History  Problem Relation Age of Onset  . Diabetes Mother   . Hypertension Mother   . Hernia Mother        inguinal  . Diabetes Father   . Cancer Sister        intestinal  . Hypertension Sister   . Diabetes Sister   . Glaucoma Sister   . Diabetes Sister   . Hypertension Sister   . Hyperlipidemia Sister   . Heart disease Sister   . Diabetes Sister   . Hypertension Sister   . Hyperlipidemia Sister   . Hernia Sister        umbilical  . Hyperlipidemia Sister   . Hypertension Sister   . Diabetes Sister   . Alcohol abuse Sister   . Diabetes Sister   . Hyperlipidemia Sister   . Hypertension Sister   . Obesity Sister   . Stroke Son   . Aneurysm Paternal Aunt        brain  . Stomach cancer Neg Hx   . Colon cancer Neg Hx   . Esophageal cancer Neg Hx   . Colon polyps Neg Hx     Social History   Socioeconomic History  . Marital status: Divorced    Spouse name: Not on file  . Number of children: Not on file  . Years  of education: Not on file  . Highest education level: Not on file  Occupational History  . Occupation: Nurse    Comment: retired  Tobacco Use  . Smoking status: Former    Types: Cigarettes  . Smokeless tobacco: Never  Vaping Use  . Vaping status: Never Used  Substance and Sexual Activity  . Alcohol use: Yes    Comment: occ  . Drug use: Never  . Sexual activity: Not Currently  Other Topics Concern  . Not on file  Social History Narrative   Avoids pork and shellfish and deep sea fish. Lives with family, daughter, wears seat belt always. Divorced, retired Engineer, civil (consulting)   Social Drivers of Corporate investment banker Strain: Low Risk  (03/30/2022)   Overall Financial Resource Strain (CARDIA)   . Difficulty of Paying Living Expenses: Not hard at all  Food Insecurity: No Food Insecurity (03/30/2022)    Hunger Vital Sign   . Worried About Programme researcher, broadcasting/film/video in the Last Year: Never true   . Ran Out of Food in the Last Year: Never true  Transportation Needs: No Transportation Needs (03/30/2022)   PRAPARE - Transportation   . Lack of Transportation (Medical): No   . Lack of Transportation (Non-Medical): No  Physical Activity: Insufficiently Active (03/30/2022)   Exercise Vital Sign   . Days of Exercise per Week: 7 days   . Minutes of Exercise per Session: 20 min  Stress: No Stress Concern Present (03/30/2022)   [employer] of Occupational Health - Occupational Stress Questionnaire   . Feeling of Stress : Not at all  Social Connections: Socially Isolated (03/30/2022)   Social Connection and Isolation Panel [NHANES]   . Frequency of Communication with Friends and Family: Three times a week   . Frequency of Social Gatherings with Friends and Family: Once a week   . Attends Religious Services: Never   . Active Member of Clubs or Organizations: No   . Attends Banker Meetings: Never   . Marital Status: Divorced  Catering manager Violence: Not At Risk (03/30/2022)   Humiliation, Afraid, Rape, and Kick questionnaire   . Fear of Current or Ex-Partner: No   . Emotionally Abused: No   . Physically Abused: No   . Sexually Abused: No    Outpatient Medications Prior to Visit  Medication Sig Dispense Refill  . albuterol  (VENTOLIN  HFA) 108 (90 Base) MCG/ACT inhaler USE 2 INHALATIONS BY MOUTH EVERY 6 HOURS AS NEEDED FOR WHEEZING  OR SHORTNESS OF BREATH 72 g 3  . amLODipine  (NORVASC ) 2.5 MG tablet TAKE 1 TABLET BY MOUTH DAILY 90 tablet 3  . Ascorbic Acid (VITAMIN C PO) Take by mouth.    . benzonatate  (TESSALON ) 100 MG capsule TAKE 1 CAPSULE BY MOUTH 3 TIMES  DAILY AS NEEDED FOR COUGH 60 capsule 0  . cetirizine  (ZYRTEC ) 10 MG tablet Take 1 tablet (10 mg total) by mouth daily. 90 tablet 3  . famotidine  (PEPCID ) 40 MG tablet Take 1 tablet (40 mg total) by mouth at bedtime. 90  tablet 1  . furosemide  (LASIX ) 20 MG tablet TAKE 1 TABLET BY MOUTH DAILY AND A SECOND TABLET DAILY AS NEEDED  FOR PEDAL EDEMA OR WEIGHT GAIN  OF 3 LB IN 24 HOURS 180 tablet 3  . hydrochlorothiazide  (MICROZIDE ) 12.5 MG capsule TAKE 1 CAPSULE BY MOUTH DAILY 90 capsule 3  . metFORMIN  (GLUCOPHAGE -XR) 500 MG 24 hr tablet TAKE 2 TABLETS BY MOUTH IN THE  MORNING AND 1 TABLET BY  MOUTH IN THE EVENING 270 tablet 3  . montelukast  (SINGULAIR ) 10 MG tablet TAKE 1 TABLET BY MOUTH AT  BEDTIME AS NEEDED FOR ALLERGIES 90 tablet 3  . omeprazole  (PRILOSEC) 40 MG capsule TAKE 1 CAPSULE BY MOUTH IN THE  MORNING AND AT BEDTIME 180 capsule 3  . oxybutynin  (DITROPAN -XL) 10 MG 24 hr tablet TAKE 1 TABLET(10 MG) BY MOUTH AT BEDTIME 30 tablet 2  . potassium chloride  SA (KLOR-CON  M) 20 MEQ tablet TAKE 1 TABLET BY MOUTH DAILY 90 tablet 3  . rosuvastatin  (CRESTOR ) 10 MG tablet TAKE 1 TABLET BY MOUTH DAILY 90 tablet 3  . Semaglutide ,0.25 or 0.5MG /DOS, (OZEMPIC , 0.25 OR 0.5 MG/DOSE,) 2 MG/3ML SOPN Inject 0.25 mg into the skin once a week. 3 mL 2  . tiZANidine  (ZANAFLEX ) 2 MG tablet Take 0.5-1 tablets (1-2 mg total) by mouth at bedtime as needed for muscle spasms. 30 tablet 1  . VITAMIN A PO Take by mouth.    Aaron Aas VITAMIN B COMPLEX-C PO Take by mouth.    Aaron Aas VITAMIN D PO Take by mouth.     No facility-administered medications prior to visit.    Allergies  Allergen Reactions  . Iodine Hives  . Shellfish Allergy Rash    Vomiting  Deep sea fish   . Codeine     tinnitus  . Demerol [Meperidine Hcl]     hyperactivity    Review of Systems  Constitutional:  Negative for fever and malaise/fatigue.  HENT:  Negative for congestion.   Eyes:  Negative for blurred vision.  Respiratory:  Negative for shortness of breath.   Cardiovascular:  Negative for chest pain, palpitations and leg swelling.  Gastrointestinal:  Negative for abdominal pain, blood in stool and nausea.  Genitourinary:  Positive for frequency and urgency. Negative  for dysuria.  Musculoskeletal:  Negative for falls.  Skin:  Negative for rash.  Neurological:  Positive for dizziness. Negative for loss of consciousness and headaches.  Endo/Heme/Allergies:  Negative for environmental allergies.  Psychiatric/Behavioral:  Negative for depression. The patient is not nervous/anxious.       Objective:     Physical Exam Constitutional:      General: She is not in acute distress.    Appearance: Normal appearance. She is well-developed. She is not toxic-appearing.  HENT:     Head: Normocephalic and atraumatic.     Right Ear: External ear normal.     Left Ear: External ear normal.     Nose: Nose normal.  Eyes:     General:        Right eye: No discharge.        Left eye: No discharge.     Conjunctiva/sclera: Conjunctivae normal.  Neck:     Thyroid : No thyromegaly.  Cardiovascular:     Rate and Rhythm: Normal rate and regular rhythm.     Heart sounds: Normal heart sounds. No murmur heard. Pulmonary:     Effort: Pulmonary effort is normal. No respiratory distress.     Breath sounds: Normal breath sounds.  Abdominal:     General: Bowel sounds are normal.     Palpations: Abdomen is soft.     Tenderness: There is no abdominal tenderness. There is no guarding.  Musculoskeletal:        General: Normal range of motion.     Cervical back: Neck supple.  Lymphadenopathy:     Cervical: No cervical adenopathy.  Skin:    General: Skin is warm and dry.  Neurological:  Mental Status: She is alert and oriented to person, place, and time.  Psychiatric:        Mood and Affect: Mood normal.        Behavior: Behavior normal.        Thought Content: Thought content normal.        Judgment: Judgment normal.   BP 120/66   Pulse 67   Resp 16   Ht 4\' 10"  (1.473 m)   Wt 151 lb (68.5 kg)   SpO2 97%   BMI 31.56 kg/m  Wt Readings from Last 3 Encounters:  08/31/23 151 lb (68.5 kg)  02/28/23 155 lb 12.8 oz (70.7 kg)  07/21/22 158 lb 12.8 oz (72 kg)     Diabetic Foot Exam - Simple   No data filed    Lab Results  Component Value Date   WBC 6.6 08/28/2023   HGB 12.9 08/28/2023   HCT 40.2 08/28/2023   PLT 244.0 08/28/2023   GLUCOSE 108 (H) 08/28/2023   CHOL 163 08/28/2023   TRIG 70.0 08/28/2023   HDL 68.60 08/28/2023   LDLCALC 81 08/28/2023   ALT 11 08/28/2023   AST 15 08/28/2023   NA 142 08/28/2023   K 3.9 08/28/2023   CL 101 08/28/2023   CREATININE 0.80 08/28/2023   BUN 17 08/28/2023   CO2 27 08/28/2023   TSH 1.44 08/28/2023   HGBA1C 6.4 08/28/2023   MICROALBUR <0.7 08/28/2023    Lab Results  Component Value Date   TSH 1.44 08/28/2023   Lab Results  Component Value Date   WBC 6.6 08/28/2023   HGB 12.9 08/28/2023   HCT 40.2 08/28/2023   MCV 87.8 08/28/2023   PLT 244.0 08/28/2023   Lab Results  Component Value Date   NA 142 08/28/2023   K 3.9 08/28/2023   CO2 27 08/28/2023   GLUCOSE 108 (H) 08/28/2023   BUN 17 08/28/2023   CREATININE 0.80 08/28/2023   BILITOT 0.3 08/28/2023   ALKPHOS 85 08/28/2023   AST 15 08/28/2023   ALT 11 08/28/2023   PROT 7.2 08/28/2023   ALBUMIN 4.5 08/28/2023   CALCIUM  9.4 08/28/2023   GFR 65.64 08/28/2023   Lab Results  Component Value Date   CHOL 163 08/28/2023   Lab Results  Component Value Date   HDL 68.60 08/28/2023   Lab Results  Component Value Date   LDLCALC 81 08/28/2023   Lab Results  Component Value Date   TRIG 70.0 08/28/2023   Lab Results  Component Value Date   CHOLHDL 2 08/28/2023   Lab Results  Component Value Date   HGBA1C 6.4 08/28/2023       Assessment & Plan:  Diabetes mellitus without complication (HCC) Assessment & Plan: hgba1c acceptable, minimize simple carbs. Increase exercise as tolerated. Continue current meds   Hyperlipidemia, unspecified hyperlipidemia type Assessment & Plan: Encourage heart healthy diet such as MIND or DASH diet, increase exercise, avoid trans fats, simple carbohydrates and processed foods, consider a  krill or fish or flaxseed oil cap daily. Tolerating statin   Insomnia, unspecified type Assessment & Plan: Encouraged good sleep hygiene such as dark, quiet room. No blue/green glowing lights such as computer screens in bedroom. No alcohol or stimulants in evening. Cut down on caffeine as able. Regular exercise is helpful but not just prior to bed time.    Osteopenia, unspecified location Assessment & Plan: Encouraged to get adequate exercise, calcium  and vitamin d intake    Urinary urgency -  Urinalysis, Routine w reflex microscopic -     Urine Culture    Assessment and Plan Assessment & Plan Dizziness and balance issues Intermittent dizziness and balance issues likely due to dehydration or hypoglycemia. No ear infection noted. - Encouraged hydration with 5 ounces of water every hour, avoiding caffeine. - Increased protein intake every 3-4 hours to stabilize blood sugar levels. - Ordered urinalysis to rule out urinary tract infection. - Considered CT scan if symptoms persist despite hydration and dietary changes.  Diabetes mellitus without complication Blood sugar fluctuations may contribute to dizziness and balance issues. Recent decrease in blood sugar levels with semaglutide  use. - Increased protein intake every 3-4 hours to stabilize blood sugar levels.  Urinary urgency Possible urinary tract infection considered due to recent onset of urinary urgency. - Ordered urinalysis to evaluate for urinary tract infection.      Randie Bustle, MD

## 2023-09-20 ENCOUNTER — Telehealth: Payer: Self-pay | Admitting: Family Medicine

## 2023-09-20 NOTE — Telephone Encounter (Signed)
 Copied from CRM [id number]. Topic: Medicare AWV >> Sep 20, 2023  3:29 PM Juliana Ocean wrote: Reason for CRM: LVM 09/20/2023 to schedule AWV. Please schedule Virtual or Telehealth visits ONLY  Rosalee Collins; Care Guide Ambulatory Clinical Support [place] [facility] Direct Dial: [phone]

## 2023-09-26 ENCOUNTER — Other Ambulatory Visit: Payer: Self-pay | Admitting: Family Medicine

## 2023-10-07 ENCOUNTER — Other Ambulatory Visit: Payer: Self-pay | Admitting: Family Medicine

## 2023-10-13 ENCOUNTER — Ambulatory Visit: Admitting: Podiatry

## 2023-11-09 ENCOUNTER — Other Ambulatory Visit: Payer: Self-pay | Admitting: Family Medicine

## 2023-11-09 DIAGNOSIS — Z1231 Encounter for screening mammogram for malignant neoplasm of breast: Secondary | ICD-10-CM

## 2023-12-14 ENCOUNTER — Ambulatory Visit
Admission: RE | Admit: 2023-12-14 | Discharge: 2023-12-14 | Disposition: A | Discharge status: Home / Self Care | Source: Ambulatory Visit | Attending: Family Medicine | Admitting: Family Medicine

## 2023-12-14 ENCOUNTER — Ambulatory Visit

## 2023-12-14 ENCOUNTER — Telehealth: Payer: Self-pay | Admitting: Family Medicine

## 2023-12-14 DIAGNOSIS — Z1231 Encounter for screening mammogram for malignant neoplasm of breast: Secondary | ICD-10-CM

## 2023-12-14 NOTE — Telephone Encounter (Signed)
 Copied from CRM #[id number]. Topic: Medicare AWV >> Dec 14, 2023  9:18 AM Nathanel DEL wrote: Reason for CRM: Called LVM 12/14/2023 to schedule AWV. Please schedule office or virtual visits.  Nathanel Paschal; Care Guide Ambulatory Clinical Support [place] [facility] Direct Dial: [phone]

## 2024-02-21 ENCOUNTER — Other Ambulatory Visit: Payer: Self-pay | Admitting: Family Medicine

## 2024-02-26 ENCOUNTER — Telehealth: Payer: Self-pay | Admitting: *Deleted

## 2024-02-26 ENCOUNTER — Other Ambulatory Visit: Payer: Self-pay | Admitting: Family Medicine

## 2024-02-26 ENCOUNTER — Other Ambulatory Visit

## 2024-02-26 NOTE — Telephone Encounter (Signed)
 Spoke with patient daughter and advised that we did not have orders in.  Also advised to call before she goes to make sure orders are in.

## 2024-02-26 NOTE — Telephone Encounter (Signed)
 Copied from CRM [id number]. Topic: Clinical - Request for Lab/Test Order >> Feb 26, 2024  8:30 AM Pinkey ORN wrote: Reason for CRM: Lab Orders >> Feb 26, 2024  8:37 AM Pinkey ORN wrote: Stacey daughter in law states that patient is currently at [facility] and they're stating they have no lab orders for patient.

## 2024-02-27 NOTE — Progress Notes (Signed)
 Subjective:    Patient ID: Abigail Mcpherson, female    DOB: 04-Mar-1933, 89 y.o.   MRN: [id number]  Chief Complaint  Patient presents with   Medical Management of Chronic Issues    Patient presents today for a 6 month follow-up.   Quality Metric Gaps    Eye exam, AWV, TDAP, zoster vaccines    HPI Discussed the use of AI scribe software for clinical note transcription with the patient, who gave verbal consent to proceed.  History of Present Illness Abigail Mcpherson is an 89 year old female who presents for follow up on chronic medical conditions and notes some gastrointestinal discomfort related to calcium  supplementation.  She experiences nausea when taking calcium  supplements daily, which has improved by reducing the intake to three times a week. No constipation is reported.  She incorporates dietary sources of calcium  such as yogurt and milk into her diet. However, the alternative milk product she uses, Silk, does not contain calcium . She is exploring other dietary sources to meet her calcium  needs.  She does not currently take a multivitamin but is open to it as a means to increase her calcium  intake.  She focuses on hydration, which she reports has improved her overall well-being. Her bowel movements are regular, aided by her current diet and hydration practices. No complaints of CP/palp/SOB/HA/congestion/fevers or GU c/o. Taking meds as prescribed     Past Medical History:  Diagnosis Date   Allergy    Cataract    Diabetes mellitus without complication (HCC)    GERD (gastroesophageal reflux disease)    History of shingles    Hyperlipidemia    Hypertension    Sleep apnea    cannot tolerate CPAP    Past Surgical History:  Procedure Laterality Date   APPENDECTOMY     BREAST BIOPSY Bilateral    benign   BREAST EXCISIONAL BIOPSY Bilateral    benign   BREAST SURGERY     breast biopsy left , b/l excisional biopsy 1969   BUNIONECTOMY Bilateral    CESAREAN SECTION       Family History  Problem Relation Age of Onset   Diabetes Mother    Hypertension Mother    Hernia Mother        inguinal   Diabetes Father    Cancer Sister        intestinal   Hypertension Sister    Diabetes Sister    Glaucoma Sister    Diabetes Sister    Hypertension Sister    Hyperlipidemia Sister    Heart disease Sister    Diabetes Sister    Hypertension Sister    Hyperlipidemia Sister    Hernia Sister        umbilical   Hyperlipidemia Sister    Hypertension Sister    Diabetes Sister    Alcohol abuse Sister    Diabetes Sister    Hyperlipidemia Sister    Hypertension Sister    Obesity Sister    Stroke Son    Aneurysm Paternal Aunt        brain   Stomach cancer Neg Hx    Colon cancer Neg Hx    Esophageal cancer Neg Hx    Colon polyps Neg Hx     Social History   Socioeconomic History   Marital status: Divorced    Spouse name: Not on file   Number of children: Not on file   Years of education: Not on file   Highest education level: Not on  file  Occupational History   Occupation: Nurse    Comment: retired  Tobacco Use   Smoking status: Former    Types: Cigarettes   Smokeless tobacco: Never  Vaping Use   Vaping status: Never Used  Substance and Sexual Activity   Alcohol use: Yes    Comment: occ   Drug use: Never   Sexual activity: Not Currently  Other Topics Concern   Not on file  Social History Narrative   Avoids pork and shellfish and deep sea fish. Lives with family, daughter, wears seat belt always. Divorced, retired engineer, civil (consulting)   Social Drivers of Corporate Investment Banker Strain: Low Risk  (03/30/2022)   Overall Financial Resource Strain (CARDIA)    Difficulty of Paying Living Expenses: Not hard at all  Food Insecurity: No Food Insecurity (03/30/2022)   Hunger Vital Sign    Worried About Running Out of Food in the Last Year: Never true    Ran Out of Food in the Last Year: Never true  Transportation Needs: No Transportation Needs  (03/30/2022)   PRAPARE - Administrator, Civil Service (Medical): No    Lack of Transportation (Non-Medical): No  Physical Activity: Insufficiently Active (03/30/2022)   Exercise Vital Sign    Days of Exercise per Week: 7 days    Minutes of Exercise per Session: 20 min  Stress: No Stress Concern Present (03/30/2022)   [employer] of Occupational Health - Occupational Stress Questionnaire    Feeling of Stress : Not at all  Social Connections: Socially Isolated (03/30/2022)   Social Connection and Isolation Panel    Frequency of Communication with Friends and Family: Three times a week    Frequency of Social Gatherings with Friends and Family: Once a week    Attends Religious Services: Never    Database Administrator or Organizations: No    Attends Banker Meetings: Never    Marital Status: Divorced  Catering Manager Violence: Not At Risk (03/30/2022)   Humiliation, Afraid, Rape, and Kick questionnaire    Fear of Current or Ex-Partner: No    Emotionally Abused: No    Physically Abused: No    Sexually Abused: No    Outpatient Medications Prior to Visit  Medication Sig Dispense Refill   albuterol  (VENTOLIN  HFA) 108 (90 Base) MCG/ACT inhaler USE 2 INHALATIONS BY MOUTH EVERY 6 HOURS AS NEEDED FOR WHEEZING  OR SHORTNESS OF BREATH 72 g 3   amLODipine  (NORVASC ) 2.5 MG tablet TAKE 1 TABLET BY MOUTH DAILY 90 tablet 3   Ascorbic Acid (VITAMIN C PO) Take by mouth.     benzonatate  (TESSALON ) 100 MG capsule TAKE 1 CAPSULE BY MOUTH 3 TIMES  DAILY AS NEEDED FOR COUGH 60 capsule 0   cetirizine  (ZYRTEC ) 10 MG tablet Take 1 tablet (10 mg total) by mouth daily. 90 tablet 3   famotidine  (PEPCID ) 40 MG tablet Take 1 tablet (40 mg total) by mouth at bedtime. 90 tablet 1   furosemide  (LASIX ) 20 MG tablet TAKE 1 TABLET BY MOUTH DAILY AND A SECOND TABLET DAILY AS NEEDED  FOR PEDAL EDEMA OR WEIGHT GAIN  OF 3 LB IN 24 HOURS 180 tablet 1   hydrochlorothiazide  (MICROZIDE ) 12.5 MG  capsule Take 1 capsule (12.5 mg total) by mouth daily. 90 capsule 1   metFORMIN  (GLUCOPHAGE -XR) 500 MG 24 hr tablet TAKE 2 TABLETS BY MOUTH IN THE  MORNING AND 1 TABLET BY MOUTH IN THE EVENING 270 tablet 3   montelukast  (  SINGULAIR ) 10 MG tablet Take 1 tablet (10 mg total) by mouth at bedtime as needed. 90 tablet 1   omeprazole  (PRILOSEC) 40 MG capsule TAKE 1 CAPSULE BY MOUTH TWICE  DAILY IN THE MORNING AND AT  BEDTIME 180 capsule 3   oxybutynin  (DITROPAN -XL) 10 MG 24 hr tablet TAKE 1 TABLET(10 MG) BY MOUTH AT BEDTIME 30 tablet 2   potassium chloride  SA (KLOR-CON  M) 20 MEQ tablet Take 1 tablet (20 mEq total) by mouth daily. (Patient taking differently: Take 20 mEq by mouth 3 (three) times a week.) 90 tablet 1   rosuvastatin  (CRESTOR ) 10 MG tablet TAKE 1 TABLET BY MOUTH DAILY 90 tablet 3   Semaglutide ,0.25 or 0.5MG /DOS, (OZEMPIC , 0.25 OR 0.5 MG/DOSE,) 2 MG/3ML SOPN Inject 0.25 mg into the skin once a week. 3 mL 2   tiZANidine  (ZANAFLEX ) 2 MG tablet Take 0.5-1 tablets (1-2 mg total) by mouth at bedtime as needed for muscle spasms. 30 tablet 1   VITAMIN A PO Take by mouth.     VITAMIN B COMPLEX-C PO Take by mouth.     VITAMIN D PO Take by mouth.     No facility-administered medications prior to visit.    Allergies  Allergen Reactions   Iodine Hives   Shellfish Allergy Rash    Vomiting  Deep sea fish    Codeine     tinnitus   Demerol [Meperidine Hcl]     hyperactivity    Review of Systems  Constitutional:  Negative for fever and malaise/fatigue.  HENT:  Negative for congestion.   Eyes:  Negative for blurred vision.  Respiratory:  Negative for shortness of breath.   Cardiovascular:  Negative for chest pain, palpitations and leg swelling.  Gastrointestinal:  Positive for nausea. Negative for abdominal pain and blood in stool.  Genitourinary:  Negative for dysuria and frequency.  Musculoskeletal:  Negative for falls.  Skin:  Negative for rash.  Neurological:  Negative for dizziness,  loss of consciousness and headaches.  Endo/Heme/Allergies:  Negative for environmental allergies.  Psychiatric/Behavioral:  Negative for depression. The patient is not nervous/anxious.        Objective:    Physical Exam Constitutional:      General: She is not in acute distress.    Appearance: Normal appearance. She is well-developed. She is not toxic-appearing.  HENT:     Head: Normocephalic and atraumatic.     Right Ear: External ear normal.     Left Ear: External ear normal.     Nose: Nose normal.  Eyes:     General:        Right eye: No discharge.        Left eye: No discharge.     Conjunctiva/sclera: Conjunctivae normal.  Neck:     Thyroid : No thyromegaly.  Cardiovascular:     Rate and Rhythm: Normal rate and regular rhythm.     Heart sounds: Normal heart sounds. No murmur heard. Pulmonary:     Effort: Pulmonary effort is normal. No respiratory distress.     Breath sounds: Normal breath sounds.  Abdominal:     General: Bowel sounds are normal.     Palpations: Abdomen is soft.     Tenderness: There is no abdominal tenderness. There is no guarding.  Musculoskeletal:        General: Normal range of motion.     Cervical back: Neck supple.  Lymphadenopathy:     Cervical: No cervical adenopathy.  Skin:    General: Skin is warm  and dry.  Neurological:     Mental Status: She is alert and oriented to person, place, and time.  Psychiatric:        Mood and Affect: Mood normal.        Behavior: Behavior normal.        Thought Content: Thought content normal.        Judgment: Judgment normal.     BP 132/76   Pulse 70   Temp 97.6 F (36.4 C)   Resp 16   Ht 4' 10 (1.473 m)   Wt 152 lb 12.8 oz (69.3 kg)   SpO2 97%   BMI 31.94 kg/m  Wt Readings from Last 3 Encounters:  02/29/24 152 lb 12.8 oz (69.3 kg)  08/31/23 151 lb (68.5 kg)  02/28/23 155 lb 12.8 oz (70.7 kg)    Diabetic Foot Exam - Simple   No data filed    Lab Results  Component Value Date   WBC 5.6  02/29/2024   HGB 12.7 02/29/2024   HCT 37.8 02/29/2024   PLT 198.0 02/29/2024   GLUCOSE 90 02/29/2024   CHOL 171 02/29/2024   TRIG 98.0 02/29/2024   HDL 65.80 02/29/2024   LDLCALC 85 02/29/2024   ALT 11 02/29/2024   AST 14 02/29/2024   NA 141 02/29/2024   K 4.0 02/29/2024   CL 103 02/29/2024   CREATININE 0.79 02/29/2024   BUN 19 02/29/2024   CO2 29 02/29/2024   TSH 1.03 02/29/2024   HGBA1C 6.4 02/29/2024   MICROALBUR <0.7 08/28/2023    Lab Results  Component Value Date   TSH 1.03 02/29/2024   Lab Results  Component Value Date   WBC 5.6 02/29/2024   HGB 12.7 02/29/2024   HCT 37.8 02/29/2024   MCV 84.9 02/29/2024   PLT 198.0 02/29/2024   Lab Results  Component Value Date   NA 141 02/29/2024   K 4.0 02/29/2024   CO2 29 02/29/2024   GLUCOSE 90 02/29/2024   BUN 19 02/29/2024   CREATININE 0.79 02/29/2024   BILITOT 0.4 02/29/2024   ALKPHOS 90 02/29/2024   AST 14 02/29/2024   ALT 11 02/29/2024   PROT 6.9 02/29/2024   ALBUMIN 4.3 02/29/2024   CALCIUM  9.3 02/29/2024   GFR 66.40 02/29/2024   Lab Results  Component Value Date   CHOL 171 02/29/2024   Lab Results  Component Value Date   HDL 65.80 02/29/2024   Lab Results  Component Value Date   LDLCALC 85 02/29/2024   Lab Results  Component Value Date   TRIG 98.0 02/29/2024   Lab Results  Component Value Date   CHOLHDL 3 02/29/2024   Lab Results  Component Value Date   HGBA1C 6.4 02/29/2024       Assessment & Plan:  Hyperlipidemia, unspecified hyperlipidemia type Assessment & Plan: Encourage heart healthy diet such as MIND or DASH diet, increase exercise, avoid trans fats, simple carbohydrates and processed foods, consider a krill or fish or flaxseed oil cap daily. Tolerating statin  Orders: -     TSH -     Lipid panel -     Lipid panel; Future -     TSH; Future  Primary hypertension Assessment & Plan: Well controlled, no changes to meds. Encouraged heart healthy diet such as the DASH diet  and exercise as tolerated.   Orders: -     Comprehensive metabolic panel with GFR -     CBC with Differential/Platelet -     Comprehensive metabolic  panel with GFR; Future -     CBC with Differential/Platelet; Future -     TSH; Future  Insomnia, unspecified type  Diabetes mellitus without complication (HCC) Assessment & Plan: hgba1c acceptable, minimize simple carbs. Increase exercise as tolerated. Continue current meds  Orders: -     Hemoglobin A1c -     Hemoglobin A1c; Future -     TSH; Future -     Microalbumin / creatinine urine ratio; Future    Assessment and Plan Assessment & Plan Osteopenia Experiencing gastrointestinal upset with daily calcium  supplementation, specifically nausea. Currently taking calcium  three times a week. Dietary calcium  intake includes yogurt, but not from silk milk. Discussed the importance of dietary calcium  intake and the potential benefits of calcium  citrate over calcium  carbonate due to better absorption and less gastrointestinal upset. - Check if current calcium  supplement is calcium  citrate; switch to calcium  citrate if not. - Incorporate two servings of dietary calcium  daily, such as yogurt, kefir, cottage cheese, or spinach. - Consider trying different types of fortified milk products for additional calcium  intake.  General Health Maintenance Discussed the importance of hydration and dietary intake for overall health maintenance. Encouraged the use of a multivitamin to supplement dietary intake and support overall health. Emphasized the importance of regular hydration and dietary adjustments to support aging well. - Continue hydration efforts. - Consider starting a multivitamin, such as Centrum Silver, to supplement dietary intake. - Continue current dietary habits and consider adding kefir or other calcium -rich foods.  Recording duration: 10 minutes     Harlene Horton, MD

## 2024-02-27 NOTE — Assessment & Plan Note (Signed)
 Well controlled, no changes to meds. Encouraged heart healthy diet such as the DASH diet and exercise as tolerated.

## 2024-02-27 NOTE — Assessment & Plan Note (Deleted)
 Encouraged good sleep hygiene such as dark, quiet room. No blue/green glowing lights such as computer screens in bedroom. No alcohol or stimulants in evening. Cut down on caffeine as able. Regular exercise is helpful but not just prior to bed time.

## 2024-02-27 NOTE — Assessment & Plan Note (Signed)
 hgba1c acceptable, minimize simple carbs. Increase exercise as tolerated. Continue current meds

## 2024-02-27 NOTE — Assessment & Plan Note (Signed)
 Encourage heart healthy diet such as MIND or DASH diet, increase exercise, avoid trans fats, simple carbohydrates and processed foods, consider a krill or fish or flaxseed oil cap daily. Tolerating statin

## 2024-02-29 ENCOUNTER — Ambulatory Visit: Admitting: Family Medicine

## 2024-02-29 VITALS — BP 132/76 | HR 70 | Temp 97.6°F | Resp 16 | Ht <= 58 in | Wt 152.8 lb

## 2024-02-29 DIAGNOSIS — E785 Hyperlipidemia, unspecified: Secondary | ICD-10-CM

## 2024-02-29 DIAGNOSIS — I10 Essential (primary) hypertension: Secondary | ICD-10-CM | POA: Diagnosis not present

## 2024-02-29 DIAGNOSIS — G4700 Insomnia, unspecified: Secondary | ICD-10-CM | POA: Diagnosis not present

## 2024-02-29 DIAGNOSIS — E119 Type 2 diabetes mellitus without complications: Secondary | ICD-10-CM

## 2024-02-29 LAB — COMPREHENSIVE METABOLIC PANEL WITH GFR
ALT: 11 U/L (ref 0–35)
AST: 14 U/L (ref 0–37)
Albumin: 4.3 g/dL (ref 3.5–5.2)
Alkaline Phosphatase: 90 U/L (ref 39–117)
BUN: 19 mg/dL (ref 6–23)
CO2: 29 meq/L (ref 19–32)
Calcium: 9.3 mg/dL (ref 8.4–10.5)
Chloride: 103 meq/L (ref 96–112)
Creatinine, Ser: 0.79 mg/dL (ref 0.40–1.20)
GFR: 66.4 mL/min (ref >60.00)
Glucose, Bld: 90 mg/dL (ref 70–99)
Potassium: 4 meq/L (ref 3.5–5.1)
Sodium: 141 meq/L (ref 135–145)
Total Bilirubin: 0.4 mg/dL (ref 0.2–1.2)
Total Protein: 6.9 g/dL (ref 6.0–8.3)

## 2024-02-29 LAB — LIPID PANEL
Cholesterol: 171 mg/dL (ref 0–200)
HDL: 65.8 mg/dL (ref >39.00)
LDL Cholesterol: 85 mg/dL (ref 0–99)
NonHDL: 104.94
Total CHOL/HDL Ratio: 3
Triglycerides: 98 mg/dL (ref 0.0–149.0)
VLDL: 19.6 mg/dL (ref 0.0–40.0)

## 2024-02-29 LAB — TSH: TSH: 1.03 u[IU]/mL (ref 0.35–5.50)

## 2024-02-29 LAB — CBC WITH DIFFERENTIAL/PLATELET
Basophils Absolute: 0 K/uL (ref 0.0–0.1)
Basophils Relative: 0.2 % (ref 0.0–3.0)
Eosinophils Absolute: 0.1 K/uL (ref 0.0–0.7)
Eosinophils Relative: 1.2 % (ref 0.0–5.0)
HCT: 37.8 % (ref 36.0–46.0)
Hemoglobin: 12.7 g/dL (ref 12.0–15.0)
Lymphocytes Relative: 36.8 % (ref 12.0–46.0)
Lymphs Abs: 2.1 K/uL (ref 0.7–4.0)
MCHC: 33.5 g/dL (ref 30.0–36.0)
MCV: 84.9 fl (ref 78.0–100.0)
Monocytes Absolute: 0.4 K/uL (ref 0.1–1.0)
Monocytes Relative: 6.3 % (ref 3.0–12.0)
Neutro Abs: 3.1 K/uL (ref 1.4–7.7)
Neutrophils Relative %: 55.5 % (ref 43.0–77.0)
Platelets: 198 K/uL (ref 150.0–400.0)
RBC: 4.46 Mil/uL (ref 3.87–5.11)
RDW: 14.1 % (ref 11.5–15.5)
WBC: 5.6 K/uL (ref 4.0–10.5)

## 2024-02-29 LAB — HEMOGLOBIN A1C: Hgb A1c MFr Bld: 6.4 % (ref 4.6–6.5)

## 2024-02-29 NOTE — Patient Instructions (Addendum)
 Calcium  citrate absorbs better than calcium  carbonate  Hypertension, Adult High blood pressure (hypertension) is when the force of blood pumping through the arteries is too strong. The arteries are the blood vessels that carry blood from the heart throughout the body. Hypertension forces the heart to work harder to pump blood and may cause arteries to become narrow or stiff. Untreated or uncontrolled hypertension can lead to a heart attack, heart failure, a stroke, kidney disease, and other problems. A blood pressure reading consists of a higher number over a lower number. Ideally, your blood pressure should be below 120/80. The first (top) number is called the systolic pressure. It is a measure of the pressure in your arteries as your heart beats. The second (bottom) number is called the diastolic pressure. It is a measure of the pressure in your arteries as the heart relaxes. What are the causes? The exact cause of this condition is not known. There are some conditions that result in high blood pressure. What increases the risk? Certain factors may make you more likely to develop high blood pressure. Some of these risk factors are under your control, including: Smoking. Not getting enough exercise or physical activity. Being overweight. Having too much fat, sugar, calories, or salt (sodium) in your diet. Drinking too much alcohol. Other risk factors include: Having a personal history of heart disease, diabetes, high cholesterol, or kidney disease. Stress. Having a family history of high blood pressure and high cholesterol. Having obstructive sleep apnea. Age. The risk increases with age. What are the signs or symptoms? High blood pressure may not cause symptoms. Very high blood pressure (hypertensive crisis) may cause: Headache. Fast or irregular heartbeats (palpitations). Shortness of breath. Nosebleed. Nausea and vomiting. Vision changes. Severe chest pain, dizziness, and  seizures. How is this diagnosed? This condition is diagnosed by measuring your blood pressure while you are seated, with your arm resting on a flat surface, your legs uncrossed, and your feet flat on the floor. The cuff of the blood pressure monitor will be placed directly against the skin of your upper arm at the level of your heart. Blood pressure should be measured at least twice using the same arm. Certain conditions can cause a difference in blood pressure between your right and left arms. If you have a high blood pressure reading during one visit or you have normal blood pressure with other risk factors, you may be asked to: Return on a different day to have your blood pressure checked again. Monitor your blood pressure at home for 1 week or longer. If you are diagnosed with hypertension, you may have other blood or imaging tests to help your health care provider understand your overall risk for other conditions. How is this treated? This condition is treated by making healthy lifestyle changes, such as eating healthy foods, exercising more, and reducing your alcohol intake. You may be referred for counseling on a healthy diet and physical activity. Your health care provider may prescribe medicine if lifestyle changes are not enough to get your blood pressure under control and if: Your systolic blood pressure is above 130. Your diastolic blood pressure is above 80. Your personal target blood pressure may vary depending on your medical conditions, your age, and other factors. Follow these instructions at home: Eating and drinking  Eat a diet that is high in fiber and potassium, and low in sodium, added sugar, and fat. An example of this eating plan is called the DASH diet. DASH stands for Dietary Approaches  to Stop Hypertension. To eat this way: Eat plenty of fresh fruits and vegetables. Try to fill one half of your plate at each meal with fruits and vegetables. Eat whole grains, such as  whole-wheat pasta, brown rice, or whole-grain bread. Fill about one fourth of your plate with whole grains. Eat or drink low-fat dairy products, such as skim milk or low-fat yogurt. Avoid fatty cuts of meat, processed or cured meats, and poultry with skin. Fill about one fourth of your plate with lean proteins, such as fish, chicken without skin, beans, eggs, or tofu. Avoid pre-made and processed foods. These tend to be higher in sodium, added sugar, and fat. Reduce your daily sodium intake. Many people with hypertension should eat less than 1,500 mg of sodium a day. Do not drink alcohol if: Your health care provider tells you not to drink. You are pregnant, may be pregnant, or are planning to become pregnant. If you drink alcohol: Limit how much you have to: 0-1 drink a day for women. 0-2 drinks a day for men. Know how much alcohol is in your drink. In the [place] one drink equals one 12 oz bottle of beer (355 mL), one 5 oz glass of wine (148 mL), or one 1 oz glass of hard liquor (44 mL). Lifestyle  Work with your health care provider to maintain a healthy body weight or to lose weight. Ask what an ideal weight is for you. Get at least 30 minutes of exercise that causes your heart to beat faster (aerobic exercise) most days of the week. Activities may include walking, swimming, or biking. Include exercise to strengthen your muscles (resistance exercise), such as Pilates or lifting weights, as part of your weekly exercise routine. Try to do these types of exercises for 30 minutes at least 3 days a week. Do not use any products that contain nicotine or tobacco. These products include cigarettes, chewing tobacco, and vaping devices, such as e-cigarettes. If you need help quitting, ask your health care provider. Monitor your blood pressure at home as told by your health care provider. Keep all follow-up visits. This is important. Medicines Take over-the-counter and prescription medicines only as  told by your health care provider. Follow directions carefully. Blood pressure medicines must be taken as prescribed. Do not skip doses of blood pressure medicine. Doing this puts you at risk for problems and can make the medicine less effective. Ask your health care provider about side effects or reactions to medicines that you should watch for. Contact a health care provider if you: Think you are having a reaction to a medicine you are taking. Have headaches that keep coming back (recurring). Feel dizzy. Have swelling in your ankles. Have trouble with your vision. Get help right away if you: Develop a severe headache or confusion. Have unusual weakness or numbness. Feel faint. Have severe pain in your chest or abdomen. Vomit repeatedly. Have trouble breathing. These symptoms may be an emergency. Get help right away. Call 911. Do not wait to see if the symptoms will go away. Do not drive yourself to the hospital. Summary Hypertension is when the force of blood pumping through your arteries is too strong. If this condition is not controlled, it may put you at risk for serious complications. Your personal target blood pressure may vary depending on your medical conditions, your age, and other factors. For most people, a normal blood pressure is less than 120/80. Hypertension is treated with lifestyle changes, medicines, or a combination of both.  Lifestyle changes include losing weight, eating a healthy, low-sodium diet, exercising more, and limiting alcohol. This information is not intended to replace advice given to you by your health care provider. Make sure you discuss any questions you have with your health care provider. Document Revised: 02/09/2021 Document Reviewed: 02/09/2021 Elsevier Patient Education  2024 [employer].

## 2024-03-03 ENCOUNTER — Encounter: Payer: Self-pay | Admitting: Family Medicine

## 2024-03-27 NOTE — Assessment & Plan Note (Signed)
 Encourage heart healthy diet such as MIND or DASH diet, increase exercise, avoid trans fats, simple carbohydrates and processed foods, consider a krill or fish or flaxseed oil cap daily. Tolerating statin

## 2024-03-27 NOTE — Assessment & Plan Note (Signed)
 Well controlled, no changes to meds. Encouraged heart healthy diet such as the DASH diet and exercise as tolerated.

## 2024-03-27 NOTE — Assessment & Plan Note (Signed)
 Encouraged to get adequate exercise, calcium and vitamin d intake

## 2024-03-27 NOTE — Assessment & Plan Note (Signed)
 hgba1c acceptable, minimize simple carbs. Increase exercise as tolerated. Continue current meds

## 2024-03-27 NOTE — Progress Notes (Signed)
 Subjective:    Patient ID: Abigail Mcpherson, female    DOB: Feb 28, 1933, 89 y.o.   MRN: [id number]  No chief complaint on file.   HPI Discussed the use of AI scribe software for clinical note transcription with the patient, who gave verbal consent to proceed.  History of Present Illness Abigail Mcpherson is an 89 year old female in today for annual preventative exam and follow up on chronic medical concerns. No recent febrile illness or hospitalizations. Denies CP/palp/SOB/HA/congestion/fevers/GI or GU c/o. Taking meds as prescribed. She notes she has recurrent foot blisters but none at present  She has been experiencing recurrent blisters on the sole of her left foot for the past two years. These blisters are painful, contain cloudy fluid, and resolve within three days after being deflated. The blisters occur approximately 20 times a year, with no identifiable pattern or trigger related to footwear or activity.  Her diabetes is managed with semaglutide  injections, starting at a dose of 0.25 mg weekly. There are no issues with the medication, and she has not yet picked up her refill. The patient reports that her blood sugar levels have been well controlled, except for a few days when she thinks the meter was not working properly. She maintains a diet high in protein and stays hydrated.  She has had a non-productive cough for the past one to two weeks, which worsens in the evenings. There is no associated fever or significant heartburn.  Bowel movements are regular, and there are no new urinary or skin issues. No changes in her family history.    Past Medical History:  Diagnosis Date   Allergy    Cataract    Diabetes mellitus without complication (HCC)    GERD (gastroesophageal reflux disease)    History of shingles    Hyperlipidemia    Hypertension    Sleep apnea    cannot tolerate CPAP    Past Surgical History:  Procedure Laterality Date   APPENDECTOMY     BREAST  BIOPSY Bilateral    benign   BREAST EXCISIONAL BIOPSY Bilateral    benign   BREAST SURGERY     breast biopsy left , b/l excisional biopsy 1969   BUNIONECTOMY Bilateral    CESAREAN SECTION      Family History  Problem Relation Age of Onset   Diabetes Mother    Hypertension Mother    Hernia Mother        inguinal   Diabetes Father    Cancer Sister        intestinal   Hypertension Sister    Diabetes Sister    Glaucoma Sister    Diabetes Sister    Hypertension Sister    Hyperlipidemia Sister    Heart disease Sister    Diabetes Sister    Hypertension Sister    Hyperlipidemia Sister    Hernia Sister        umbilical   Hyperlipidemia Sister    Hypertension Sister    Diabetes Sister    Alcohol abuse Sister    Diabetes Sister    Hyperlipidemia Sister    Hypertension Sister    Obesity Sister    Stroke Son    Aneurysm Paternal Aunt        brain   Stomach cancer Neg Hx    Colon cancer Neg Hx    Esophageal cancer Neg Hx    Colon polyps Neg Hx     Social History   Socioeconomic History   Marital  status: Divorced    Spouse name: Not on file   Number of children: Not on file   Years of education: Not on file   Highest education level: Not on file  Occupational History   Occupation: Nurse    Comment: retired  Tobacco Use   Smoking status: Former    Types: Cigarettes   Smokeless tobacco: Never  Vaping Use   Vaping status: Never Used  Substance and Sexual Activity   Alcohol use: Yes    Comment: occ   Drug use: Never   Sexual activity: Not Currently  Other Topics Concern   Not on file  Social History Narrative   Avoids pork and shellfish and deep sea fish. Lives with family, daughter, wears seat belt always. Divorced, retired engineer, civil (consulting)   Social Drivers of Corporate Investment Banker Strain: Low Risk  (03/30/2022)   Overall Financial Resource Strain (CARDIA)    Difficulty of Paying Living Expenses: Not hard at all   Food Insecurity: No Food Insecurity (03/30/2022)   Hunger Vital Sign    Worried About Running Out of Food in the Last Year: Never true    Ran Out of Food in the Last Year: Never true  Transportation Needs: No Transportation Needs (03/30/2022)   PRAPARE - Administrator, Civil Service (Medical): No    Lack of Transportation (Non-Medical): No  Physical Activity: Insufficiently Active (03/30/2022)   Exercise Vital Sign    Days of Exercise per Week: 7 days    Minutes of Exercise per Session: 20 min  Stress: No Stress Concern Present (03/30/2022)   [employer] of Occupational Health - Occupational Stress Questionnaire    Feeling of Stress : Not at all  Social Connections: Socially Isolated (03/30/2022)   Social Connection and Isolation Panel    Frequency of Communication with Friends and Family: Three times a week    Frequency of Social Gatherings with Friends and Family: Once a week    Attends Religious Services: Never    Database Administrator or Organizations: No    Attends Banker Meetings: Never    Marital Status: Divorced  Catering Manager Violence: Not At Risk (03/30/2022)   Humiliation, Afraid, Rape, and Kick questionnaire    Fear of Current or Ex-Partner: No    Emotionally Abused: No    Physically Abused: No    Sexually Abused: No    Outpatient Medications Prior to Visit  Medication Sig Dispense Refill   albuterol  (VENTOLIN  HFA) 108 (90 Base) MCG/ACT inhaler USE 2 INHALATIONS BY MOUTH EVERY 6 HOURS AS NEEDED FOR WHEEZING  OR SHORTNESS OF BREATH 72 g 3   amLODipine  (NORVASC ) 2.5 MG tablet TAKE 1 TABLET BY MOUTH DAILY 90 tablet 3   Ascorbic Acid (VITAMIN C PO) Take by mouth.     benzonatate  (TESSALON ) 100 MG capsule TAKE 1 CAPSULE BY MOUTH 3 TIMES  DAILY AS NEEDED FOR COUGH 60 capsule 0   cetirizine  (ZYRTEC ) 10 MG tablet Take 1 tablet (10 mg total) by mouth daily. 90 tablet 3   famotidine  (PEPCID ) 40 MG tablet Take 1  tablet (40 mg total) by mouth at bedtime. 90 tablet 1   furosemide  (LASIX ) 20 MG tablet TAKE 1 TABLET BY MOUTH DAILY AND A SECOND TABLET DAILY AS NEEDED  FOR PEDAL EDEMA OR WEIGHT GAIN  OF 3 LB IN 24 HOURS 180 tablet 1   hydrochlorothiazide  (MICROZIDE ) 12.5 MG capsule Take 1 capsule (12.5 mg total) by mouth daily. 90 capsule  1   metFORMIN  (GLUCOPHAGE -XR) 500 MG 24 hr tablet TAKE 2 TABLETS BY MOUTH IN THE  MORNING AND 1 TABLET BY MOUTH IN THE EVENING 270 tablet 3   montelukast  (SINGULAIR ) 10 MG tablet Take 1 tablet (10 mg total) by mouth at bedtime as needed. 90 tablet 1   omeprazole  (PRILOSEC) 40 MG capsule TAKE 1 CAPSULE BY MOUTH TWICE  DAILY IN THE MORNING AND AT  BEDTIME 180 capsule 3   oxybutynin  (DITROPAN -XL) 10 MG 24 hr tablet TAKE 1 TABLET(10 MG) BY MOUTH AT BEDTIME 30 tablet 2   potassium chloride  SA (KLOR-CON  M) 20 MEQ tablet Take 1 tablet (20 mEq total) by mouth daily. (Patient taking differently: Take 20 mEq by mouth 3 (three) times a week.) 90 tablet 1   rosuvastatin  (CRESTOR ) 10 MG tablet TAKE 1 TABLET BY MOUTH DAILY 90 tablet 3   Semaglutide ,0.25 or 0.5MG /DOS, (OZEMPIC , 0.25 OR 0.5 MG/DOSE,) 2 MG/3ML SOPN Inject 0.25 mg into the skin once a week. 3 mL 2   tiZANidine  (ZANAFLEX ) 2 MG tablet Take 0.5-1 tablets (1-2 mg total) by mouth at bedtime as needed for muscle spasms. 30 tablet 1   VITAMIN A PO Take by mouth.     VITAMIN B COMPLEX-C PO Take by mouth.     VITAMIN D PO Take by mouth.     No facility-administered medications prior to visit.    Allergies  Allergen Reactions   Iodine Hives   Shellfish Allergy Rash    Vomiting  Deep sea fish    Codeine     tinnitus   Demerol [Meperidine Hcl]     hyperactivity    Review of Systems  Constitutional:  Negative for fever and malaise/fatigue.  HENT:  Negative for congestion.   Eyes:  Negative for blurred vision.  Respiratory:  Positive for cough. Negative for shortness of breath.   Cardiovascular:  Negative  for chest pain, palpitations and leg swelling.  Gastrointestinal:  Negative for abdominal pain, blood in stool and nausea.  Genitourinary:  Negative for dysuria and frequency.  Musculoskeletal:  Positive for joint pain. Negative for falls.  Skin:  Negative for rash.  Neurological:  Negative for dizziness, loss of consciousness and headaches.  Endo/Heme/Allergies:  Negative for environmental allergies.  Psychiatric/Behavioral:  Negative for depression. The patient is not nervous/anxious.        Objective:    Physical Exam Constitutional:      General: She is not in acute distress.    Appearance: Normal appearance. She is well-developed. She is not toxic-appearing.  HENT:     Head: Normocephalic and atraumatic.     Right Ear: External ear normal.     Left Ear: External ear normal.     Nose: Nose normal.  Eyes:     General:        Right eye: No discharge.        Left eye: No discharge.     Conjunctiva/sclera: Conjunctivae normal.  Neck:     Thyroid : No thyromegaly.  Cardiovascular:     Rate and Rhythm: Normal rate and regular rhythm.     Heart sounds: Normal heart sounds. No murmur heard. Pulmonary:     Effort: Pulmonary effort is normal. No respiratory distress.     Breath sounds: Normal breath sounds.  Abdominal:     General: Bowel sounds are normal.     Palpations: Abdomen is soft.     Tenderness: There is no abdominal tenderness. There is no guarding.  Musculoskeletal:  General: Normal range of motion.     Cervical back: Neck supple.  Lymphadenopathy:     Cervical: No cervical adenopathy.  Skin:    General: Skin is warm and dry.  Neurological:     Mental Status: She is alert and oriented to person, place, and time.  Psychiatric:        Mood and Affect: Mood normal.        Behavior: Behavior normal.        Thought Content: Thought content normal.        Judgment: Judgment normal.     There were no vitals taken for this visit. Wt Readings from Last 3  Encounters:  02/29/24 152 lb 12.8 oz (69.3 kg)  08/31/23 151 lb (68.5 kg)  02/28/23 155 lb 12.8 oz (70.7 kg)    Diabetic Foot Exam - Simple   No data filed    Lab Results  Component Value Date   WBC 5.6 02/29/2024   HGB 12.7 02/29/2024   HCT 37.8 02/29/2024   PLT 198.0 02/29/2024   GLUCOSE 90 02/29/2024   CHOL 171 02/29/2024   TRIG 98.0 02/29/2024   HDL 65.80 02/29/2024   LDLCALC 85 02/29/2024   ALT 11 02/29/2024   AST 14 02/29/2024   NA 141 02/29/2024   K 4.0 02/29/2024   CL 103 02/29/2024   CREATININE 0.79 02/29/2024   BUN 19 02/29/2024   CO2 29 02/29/2024   TSH 1.03 02/29/2024   HGBA1C 6.4 02/29/2024   MICROALBUR <0.7 08/28/2023    Lab Results  Component Value Date   TSH 1.03 02/29/2024   Lab Results  Component Value Date   WBC 5.6 02/29/2024   HGB 12.7 02/29/2024   HCT 37.8 02/29/2024   MCV 84.9 02/29/2024   PLT 198.0 02/29/2024   Lab Results  Component Value Date   NA 141 02/29/2024   K 4.0 02/29/2024   CO2 29 02/29/2024   GLUCOSE 90 02/29/2024   BUN 19 02/29/2024   CREATININE 0.79 02/29/2024   BILITOT 0.4 02/29/2024   ALKPHOS 90 02/29/2024   AST 14 02/29/2024   ALT 11 02/29/2024   PROT 6.9 02/29/2024   ALBUMIN 4.3 02/29/2024   CALCIUM  9.3 02/29/2024   GFR 66.40 02/29/2024   Lab Results  Component Value Date   CHOL 171 02/29/2024   Lab Results  Component Value Date   HDL 65.80 02/29/2024   Lab Results  Component Value Date   LDLCALC 85 02/29/2024   Lab Results  Component Value Date   TRIG 98.0 02/29/2024   Lab Results  Component Value Date   CHOLHDL 3 02/29/2024   Lab Results  Component Value Date   HGBA1C 6.4 02/29/2024       Assessment & Plan:  Primary hypertension Assessment & Plan: Well controlled, no changes to meds. Encouraged heart healthy diet such as the DASH diet and exercise as tolerated.    Hyperlipidemia, unspecified hyperlipidemia type Assessment & Plan: Encourage heart healthy diet such as MIND or  DASH diet, increase exercise, avoid trans fats, simple carbohydrates and processed foods, consider a krill or fish or flaxseed oil cap daily. Tolerating statin   Diabetes mellitus without complication (HCC) Assessment & Plan: hgba1c acceptable, minimize simple carbs. Increase exercise as tolerated. Continue current meds   Preventative health care Assessment & Plan: Patient encouraged to maintain heart healthy diet, regular exercise, adequate sleep. Consider daily probiotics. Take medications as prescribed. Labs ordered and reviewed.  Has aged out of screening colonoscopy,  MGM and paps Dexa 07/2022 Consider RSV (respiratory syncitial virus) vaccine at pharmacy, Arexvy  Covid booster  new version At pharmacy High dose flu shot mid Sept and on  Shingrix is the new shingles shot, 2 shots over 2-6 months, confirm coverage with insurance and document, then can return here for shots with nurse appt or at pharmacy    Osteopenia, unspecified location Assessment & Plan: Encouraged to get adequate exercise, calcium  and vitamin d intake      Assessment and Plan Assessment & Plan Type 2 diabetes mellitus Blood sugars are well controlled except for a couple of days due to meter issues. Currently on semaglutide  0.25 mg weekly. Insurance requires an increase to 0.5 mg. No issues with appetite suppression or blood sugar control at current dose. - Increased semaglutide  to 0.5 mg weekly. - Sent prescription for 0.5 mg dose to [facility]. - Monitor appetite and blood sugar control over the next 6 weeks. - If 0.5 mg is effective, maintain dose; if not, consider increasing further.  Primary hypertension Blood pressure is well controlled.  Chronic pain of both knees Chronic knee pain. - Referred to sports medicine specialist, Dr. Artist Lloyd.  Chronic recurrent blisters of left foot Recurrent blisters on the sole of the left foot over the past two years, occurring approximately 20 times a year.  Blisters are painful, deflate easily, and heal quickly. Fluid is cloudy, not bloody. No clear pattern identified with shoes or activities. Previous consultation with podiatrist Dr. Tobie, who is not concerned. - Mentioned issue to Dr. Lloyd for potential insights. - Will consider referral to a different podiatrist for a second opinion. - Advised to send pictures of blisters via MyTart for further evaluation. - Will consider vascular referral if issue persists despite other interventions.  Osteopenia Bone density scan was last performed last year. Plan to repeat next year. - Will schedule bone density scan next year.  General health maintenance Discussed importance of protein intake and hydration. No significant heartburn reported. Cough present for 1-2 weeks, worse in the evenings, non-productive, no fever. - Continue Tessalon  Perles for cough. - Will consider adding Mucinex for cough management. - Encouraged increased hydration. - Discussed advanced directives and provided packet for healthcare power of attorney and living will.  Recording duration: 11 minutes     Harlene Horton, MD

## 2024-03-27 NOTE — Assessment & Plan Note (Signed)
 Patient encouraged to maintain heart healthy diet, regular exercise, adequate sleep. Consider daily probiotics. Take medications as prescribed. Labs ordered and reviewed.  Has aged out of screening colonoscopy, MGM and paps Dexa 07/2022 Consider RSV (respiratory syncitial virus) vaccine at pharmacy, Arexvy Covid booster  new version At pharmacy High dose flu shot mid Sept and on  Shingrix is the new shingles shot, 2 shots over 2-6 months, confirm coverage with insurance and document, then can return here for shots with nurse appt or at pharmacy

## 2024-03-28 ENCOUNTER — Other Ambulatory Visit (HOSPITAL_BASED_OUTPATIENT_CLINIC_OR_DEPARTMENT_OTHER): Payer: Self-pay

## 2024-03-28 ENCOUNTER — Ambulatory Visit: Payer: Medicare HMO | Admitting: Family Medicine

## 2024-03-28 VITALS — BP 130/68 | HR 72 | Temp 98.2°F | Ht <= 58 in | Wt 152.6 lb

## 2024-03-28 DIAGNOSIS — Z0000 Encounter for general adult medical examination without abnormal findings: Secondary | ICD-10-CM

## 2024-03-28 DIAGNOSIS — G8929 Other chronic pain: Secondary | ICD-10-CM

## 2024-03-28 DIAGNOSIS — I10 Essential (primary) hypertension: Secondary | ICD-10-CM

## 2024-03-28 DIAGNOSIS — M8580 Other specified disorders of bone density and structure, unspecified site: Secondary | ICD-10-CM

## 2024-03-28 DIAGNOSIS — E785 Hyperlipidemia, unspecified: Secondary | ICD-10-CM

## 2024-03-28 DIAGNOSIS — E119 Type 2 diabetes mellitus without complications: Secondary | ICD-10-CM

## 2024-03-28 MED ORDER — BENZONATATE 100 MG PO CAPS: ORAL_CAPSULE | ORAL | 5 refills | Status: AC

## 2024-03-28 MED ORDER — AREXVY 120 MCG/0.5ML IM SUSR
0.5000 mL | Freq: Once | INTRAMUSCULAR | 0 refills | Status: AC
  Filled 2024-03-28: qty 0.5, 1d supply, fill #0

## 2024-03-28 NOTE — Patient Instructions (Signed)
 RSV, Respiratory Syncitial Virus Vaccine, Arexvy at pharmacy Annual flu shot at pharmacy  All [facility] pharmacies are now walk in vaccine clinics, M-F 9-4  Preventive Care 65 Years and Older, Female Preventive care refers to lifestyle choices and visits with your health care provider that can promote health and wellness. Preventive care visits are also called wellness exams. What can I expect for my preventive care visit? Counseling Your health care provider may ask you questions about your: Medical history, including: Past medical problems. Family medical history. Pregnancy and menstrual history. History of falls. Current health, including: Memory and ability to understand (cognition). Emotional well-being. Home life and relationship well-being. Sexual activity and sexual health. Lifestyle, including: Alcohol, nicotine or tobacco, and drug use. Access to firearms. Diet, exercise, and sleep habits. Work and work astronomer. Sunscreen use. Safety issues such as seatbelt and bike helmet use. Physical exam Your health care provider will check your: Height and weight. These may be used to calculate your BMI (body mass index). BMI is a measurement that tells if you are at a healthy weight. Waist circumference. This measures the distance around your waistline. This measurement also tells if you are at a healthy weight and may help predict your risk of certain diseases, such as type 2 diabetes and high blood pressure. Heart rate and blood pressure. Body temperature. Skin for abnormal spots. What immunizations do I need?  Vaccines are usually given at various ages, according to a schedule. Your health care provider will recommend vaccines for you based on your age, medical history, and lifestyle or other factors, such as travel or where you work. What tests do I need? Screening Your health care provider may recommend screening tests for certain conditions. This may include: Lipid and  cholesterol levels. Hepatitis C test. Hepatitis B test. HIV (human immunodeficiency virus) test. STI (sexually transmitted infection) testing, if you are at risk. Lung cancer screening. Colorectal cancer screening. Diabetes screening. This is done by checking your blood sugar (glucose) after you have not eaten for a while (fasting). Mammogram. Talk with your health care provider about how often you should have regular mammograms. BRCA-related cancer screening. This may be done if you have a family history of breast, ovarian, tubal, or peritoneal cancers. Bone density scan. This is done to screen for osteoporosis. Talk with your health care provider about your test results, treatment options, and if necessary, the need for more tests. Follow these instructions at home: Eating and drinking  Eat a diet that includes fresh fruits and vegetables, whole grains, lean protein, and low-fat dairy products. Limit your intake of foods with high amounts of sugar, saturated fats, and salt. Take vitamin and mineral supplements as recommended by your health care provider. Do not drink alcohol if your health care provider tells you not to drink. If you drink alcohol: Limit how much you have to 0-1 drink a day. Know how much alcohol is in your drink. In the [place] one drink equals one 12 oz bottle of beer (355 mL), one 5 oz glass of wine (148 mL), or one 1 oz glass of hard liquor (44 mL). Lifestyle Brush your teeth every morning and night with fluoride toothpaste. Floss one time each day. Exercise for at least 30 minutes 5 or more days each week. Do not use any products that contain nicotine or tobacco. These products include cigarettes, chewing tobacco, and vaping devices, such as e-cigarettes. If you need help quitting, ask your health care provider. Do not use drugs. If  you are sexually active, practice safe sex. Use a condom or other form of protection in order to prevent STIs. Take aspirin only as told  by your health care provider. Make sure that you understand how much to take and what form to take. Work with your health care provider to find out whether it is safe and beneficial for you to take aspirin daily. Ask your health care provider if you need to take a cholesterol-lowering medicine (statin). Find healthy ways to manage stress, such as: Meditation, yoga, or listening to music. Journaling. Talking to a trusted person. Spending time with friends and family. Minimize exposure to UV radiation to reduce your risk of skin cancer. Safety Always wear your seat belt while driving or riding in a vehicle. Do not drive: If you have been drinking alcohol. Do not ride with someone who has been drinking. When you are tired or distracted. While texting. If you have been using any mind-altering substances or drugs. Wear a helmet and other protective equipment during sports activities. If you have firearms in your house, make sure you follow all gun safety procedures. What's next? Visit your health care provider once a year for an annual wellness visit. Ask your health care provider how often you should have your eyes and teeth checked. Stay up to date on all vaccines. This information is not intended to replace advice given to you by your health care provider. Make sure you discuss any questions you have with your health care provider. Document Revised: 09/30/2020 Document Reviewed: 09/30/2020 Elsevier Patient Education  2024 [employer].

## 2024-03-31 ENCOUNTER — Encounter: Payer: Self-pay | Admitting: Family Medicine

## 2024-04-08 NOTE — Progress Notes (Unsigned)
"       ° °  LILLETTE Ileana Collet, PhD, LAT, ATC acting as a scribe for Artist Lloyd, MD.  Abigail Mcpherson is a 89 y.o. female who presents to [facility] today for bilat knee pain ***  Pt completed Euflexxa series bilat on 12/21/22 done at [facility].  Knee swelling: Mechanical symptoms: Aggravates: Treatments tried:  Dx imaging: 10/17/22 R & L knee XR  Pertinent review of systems: ***  Relevant historical information: ***   Exam:  There were no vitals taken for this visit. General: Well Developed, well nourished, and in no acute distress.   MSK: ***    Lab and Radiology Results No results found for this or any previous visit (from the past 72 hours). No results found.     Assessment and Plan: 89 y.o. female with ***   PDMP not reviewed this encounter. No orders of the defined types were placed in this encounter.  No orders of the defined types were placed in this encounter.    Discussed warning signs or symptoms. Please see discharge instructions. Patient expresses understanding.   ***  "

## 2024-04-09 ENCOUNTER — Other Ambulatory Visit: Payer: Self-pay

## 2024-04-09 ENCOUNTER — Telehealth: Payer: Self-pay

## 2024-04-09 ENCOUNTER — Ambulatory Visit: Admitting: Family Medicine

## 2024-04-09 VITALS — BP 124/76 | HR 63 | Ht <= 58 in | Wt 150.0 lb

## 2024-04-09 DIAGNOSIS — M170 Bilateral primary osteoarthritis of knee: Secondary | ICD-10-CM | POA: Diagnosis not present

## 2024-04-09 DIAGNOSIS — M25562 Pain in left knee: Secondary | ICD-10-CM

## 2024-04-09 DIAGNOSIS — G8929 Other chronic pain: Secondary | ICD-10-CM

## 2024-04-09 DIAGNOSIS — M25561 Pain in right knee: Secondary | ICD-10-CM | POA: Diagnosis not present

## 2024-04-09 NOTE — Telephone Encounter (Signed)
 Please auth SINGLE dose gel shots, BILAT knees

## 2024-04-09 NOTE — Patient Instructions (Signed)
 Thank you for coming in today.   We will work to authorize single dose gel shots  Once we get the shots approved by your insurance, the office will call you to schedule.  Consider Genicular Artery Embolization procedure

## 2024-04-13 ENCOUNTER — Other Ambulatory Visit: Payer: Self-pay | Admitting: Family Medicine

## 2024-04-19 ENCOUNTER — Telehealth: Payer: Self-pay

## 2024-04-19 NOTE — Telephone Encounter (Signed)
 [employer] benefits ran for bilateral knee case ID [id number]

## 2024-04-22 NOTE — Telephone Encounter (Signed)
 Preferred drug is Durolane. Ran benefits for bilateral knee case ID [id number] and have filled out pre cert form and faxed to [employer]

## 2024-04-23 ENCOUNTER — Other Ambulatory Visit: Payer: Self-pay | Admitting: Family Medicine

## 2024-04-26 NOTE — Telephone Encounter (Signed)
 Please schedule patient when medication is stocked  DUROLANE AUTHORIZED for bilateral knee PRE CERT NOT REQUIRED/but did one anyway ([employer] worries me) Copay $10 Deductible does not apply OOP MAX $400 has met $0 Once OOP has been met copay will no longer apply Reference # [id number] Case # [id number] 04/22/24-04/22/25

## 2024-04-30 NOTE — Telephone Encounter (Signed)
 Scheduled 12/2

## 2024-05-09 ENCOUNTER — Other Ambulatory Visit: Payer: Self-pay

## 2024-05-09 ENCOUNTER — Ambulatory Visit: Admitting: Family Medicine

## 2024-05-09 DIAGNOSIS — M170 Bilateral primary osteoarthritis of knee: Secondary | ICD-10-CM | POA: Diagnosis not present

## 2024-05-09 DIAGNOSIS — M25562 Pain in left knee: Secondary | ICD-10-CM

## 2024-05-09 DIAGNOSIS — M25561 Pain in right knee: Secondary | ICD-10-CM | POA: Diagnosis not present

## 2024-05-09 DIAGNOSIS — G8929 Other chronic pain: Secondary | ICD-10-CM

## 2024-05-09 MED ORDER — SODIUM HYALURONATE 60 MG/3ML IX PRSY
60.0000 mg | PREFILLED_SYRINGE | Freq: Once | INTRA_ARTICULAR | Status: AC
  Administered 2024-05-09: 60 mg via INTRA_ARTICULAR

## 2024-05-09 NOTE — Progress Notes (Signed)
" °  Yoshie Kosel presents to clinic today for Durolane injection bilateral knee 1/1 Procedure: Real-time Ultrasound Guided Injection of right knee joint superior lateral patellar space Device: [employer] Affiniti 50G/GE Logiq Images permanently stored and available for review in PACS Verbal informed consent obtained.  Discussed risks and benefits of procedure. Warned about infection, bleeding, damage to structures among others. Patient expresses understanding and agreement Time-out conducted.   Noted no overlying erythema, induration, or other signs of local infection.   Skin prepped in a sterile fashion.   Local anesthesia: Topical Ethyl chloride.   With sterile technique and under real time ultrasound guidance: Durolane 3 mL injected into knee joint. Fluid seen entering the joint capsule.   Completed without difficulty   Advised to call if fevers/chills, erythema, induration, drainage, or persistent bleeding.   Images permanently stored and available for review in the ultrasound unit.  Impression: Technically successful ultrasound guided injection.  Procedure: Real-time Ultrasound Guided Injection of left knee joint superior lateral patellar space Device: [employer] Affiniti 50G/GE Logiq Images permanently stored and available for review in PACS Verbal informed consent obtained.  Discussed risks and benefits of procedure. Warned about infection, bleeding, damage to structures among others. Patient expresses understanding and agreement Time-out conducted.   Noted no overlying erythema, induration, or other signs of local infection.   Skin prepped in a sterile fashion.   Local anesthesia: Topical Ethyl chloride.   With sterile technique and under real time ultrasound guidance: Durolane 3 mL injected into knee joint. Fluid seen entering the joint capsule.   Completed without difficulty   Advised to call if fevers/chills, erythema, induration, drainage, or persistent bleeding.   Images  permanently stored and available for review in the ultrasound unit.  Impression: Technically successful ultrasound guided injection. Lot number: [id number] both knees. If not improved next up would be genicular artery embolization.  Happy to place a referral if needed.  "

## 2024-05-09 NOTE — Patient Instructions (Signed)
 Thank you for coming in today.   You received an injection today. Seek immediate medical attention if the joint becomes red, extremely painful, or is oozing fluid.   Let me know about the Genicular Artery Embolization procedure.   See you back as needed.

## 2024-05-11 ENCOUNTER — Other Ambulatory Visit: Payer: Self-pay | Admitting: Family Medicine

## 2024-09-12 ENCOUNTER — Ambulatory Visit: Admitting: Family Medicine
